# Patient Record
Sex: Female | Born: 1945
Health system: Southern US, Community
[De-identification: ages and names within clinical notes are randomized; demographics above are authoritative.]

## PROBLEM LIST (undated history)

## (undated) DIAGNOSIS — R112 Nausea with vomiting, unspecified: Secondary | ICD-10-CM

## (undated) DIAGNOSIS — E538 Deficiency of other specified B group vitamins: Secondary | ICD-10-CM

## (undated) DIAGNOSIS — J302 Other seasonal allergic rhinitis: Secondary | ICD-10-CM

## (undated) DIAGNOSIS — N814 Uterovaginal prolapse, unspecified: Secondary | ICD-10-CM

## (undated) DIAGNOSIS — I10 Essential (primary) hypertension: Secondary | ICD-10-CM

## (undated) DIAGNOSIS — E7800 Pure hypercholesterolemia, unspecified: Secondary | ICD-10-CM

## (undated) DIAGNOSIS — T8859XA Other complications of anesthesia, initial encounter: Secondary | ICD-10-CM

## (undated) DIAGNOSIS — E119 Type 2 diabetes mellitus without complications: Secondary | ICD-10-CM

## (undated) DIAGNOSIS — J189 Pneumonia, unspecified organism: Secondary | ICD-10-CM

## (undated) DIAGNOSIS — C4490 Unspecified malignant neoplasm of skin, unspecified: Secondary | ICD-10-CM

## (undated) DIAGNOSIS — Z9889 Other specified postprocedural states: Secondary | ICD-10-CM

## (undated) DIAGNOSIS — K5090 Crohn's disease, unspecified, without complications: Secondary | ICD-10-CM

## (undated) DIAGNOSIS — M1990 Unspecified osteoarthritis, unspecified site: Secondary | ICD-10-CM

## (undated) HISTORY — DX: Essential (primary) hypertension: I10

## (undated) HISTORY — PX: WISDOM TOOTH EXTRACTION: SHX21

## (undated) HISTORY — PX: SKIN CANCER EXCISION: SHX779

## (undated) HISTORY — DX: Unspecified malignant neoplasm of skin, unspecified: C44.90

## (undated) HISTORY — PX: DG ELBOW RIGHT COMPLETE (ARMC HX): HXRAD1501

## (undated) HISTORY — DX: Crohn's disease, unspecified, without complications: K50.90

## (undated) HISTORY — PX: OTHER SURGICAL HISTORY: SHX169

## (undated) HISTORY — DX: Type 2 diabetes mellitus without complications: E11.9

## (undated) HISTORY — DX: Unspecified osteoarthritis, unspecified site: M19.90

## (undated) HISTORY — PX: POLYPECTOMY: SHX149

## (undated) HISTORY — DX: Pure hypercholesterolemia, unspecified: E78.00

---

## 1975-10-18 HISTORY — PX: ABDOMINAL HYSTERECTOMY: SHX81

## 1976-10-17 DIAGNOSIS — R87629 Unspecified abnormal cytological findings in specimens from vagina: Secondary | ICD-10-CM

## 1976-10-17 HISTORY — DX: Unspecified abnormal cytological findings in specimens from vagina: R87.629

## 1983-10-18 HISTORY — PX: BREAST REDUCTION SURGERY: SHX8

## 1998-02-14 HISTORY — PX: OTHER SURGICAL HISTORY: SHX169

## 1998-05-22 ENCOUNTER — Ambulatory Visit (HOSPITAL_COMMUNITY): Admission: RE | Admit: 1998-05-22 | Discharge: 1998-05-22 | Payer: Self-pay | Admitting: Gastroenterology

## 1998-07-20 ENCOUNTER — Encounter: Payer: Self-pay | Admitting: Gastroenterology

## 2011-07-18 HISTORY — PX: OTHER SURGICAL HISTORY: SHX169

## 2013-10-21 DIAGNOSIS — E538 Deficiency of other specified B group vitamins: Secondary | ICD-10-CM | POA: Diagnosis not present

## 2013-10-21 DIAGNOSIS — E119 Type 2 diabetes mellitus without complications: Secondary | ICD-10-CM | POA: Diagnosis not present

## 2013-10-21 DIAGNOSIS — E785 Hyperlipidemia, unspecified: Secondary | ICD-10-CM | POA: Diagnosis not present

## 2013-10-28 DIAGNOSIS — E538 Deficiency of other specified B group vitamins: Secondary | ICD-10-CM | POA: Diagnosis not present

## 2013-10-28 DIAGNOSIS — E1149 Type 2 diabetes mellitus with other diabetic neurological complication: Secondary | ICD-10-CM | POA: Diagnosis not present

## 2013-10-28 DIAGNOSIS — Z0000 Encounter for general adult medical examination without abnormal findings: Secondary | ICD-10-CM | POA: Diagnosis not present

## 2013-10-28 DIAGNOSIS — E119 Type 2 diabetes mellitus without complications: Secondary | ICD-10-CM | POA: Diagnosis not present

## 2013-10-28 DIAGNOSIS — I10 Essential (primary) hypertension: Secondary | ICD-10-CM | POA: Diagnosis not present

## 2013-10-28 DIAGNOSIS — E785 Hyperlipidemia, unspecified: Secondary | ICD-10-CM | POA: Diagnosis not present

## 2013-10-29 DIAGNOSIS — L03039 Cellulitis of unspecified toe: Secondary | ICD-10-CM | POA: Diagnosis not present

## 2013-10-29 DIAGNOSIS — M79609 Pain in unspecified limb: Secondary | ICD-10-CM | POA: Diagnosis not present

## 2013-10-29 DIAGNOSIS — L600 Ingrowing nail: Secondary | ICD-10-CM | POA: Diagnosis not present

## 2013-11-21 DIAGNOSIS — Z8639 Personal history of other endocrine, nutritional and metabolic disease: Secondary | ICD-10-CM | POA: Diagnosis not present

## 2013-11-21 DIAGNOSIS — Z862 Personal history of diseases of the blood and blood-forming organs and certain disorders involving the immune mechanism: Secondary | ICD-10-CM | POA: Diagnosis not present

## 2013-11-21 DIAGNOSIS — E119 Type 2 diabetes mellitus without complications: Secondary | ICD-10-CM | POA: Diagnosis not present

## 2013-11-21 DIAGNOSIS — E1149 Type 2 diabetes mellitus with other diabetic neurological complication: Secondary | ICD-10-CM | POA: Diagnosis not present

## 2013-11-26 DIAGNOSIS — E1149 Type 2 diabetes mellitus with other diabetic neurological complication: Secondary | ICD-10-CM | POA: Diagnosis not present

## 2013-11-26 DIAGNOSIS — L608 Other nail disorders: Secondary | ICD-10-CM | POA: Diagnosis not present

## 2013-11-26 DIAGNOSIS — M79609 Pain in unspecified limb: Secondary | ICD-10-CM | POA: Diagnosis not present

## 2013-12-02 DIAGNOSIS — E538 Deficiency of other specified B group vitamins: Secondary | ICD-10-CM | POA: Diagnosis not present

## 2013-12-30 DIAGNOSIS — E538 Deficiency of other specified B group vitamins: Secondary | ICD-10-CM | POA: Diagnosis not present

## 2014-02-10 DIAGNOSIS — E538 Deficiency of other specified B group vitamins: Secondary | ICD-10-CM | POA: Diagnosis not present

## 2014-02-18 DIAGNOSIS — M79609 Pain in unspecified limb: Secondary | ICD-10-CM | POA: Diagnosis not present

## 2014-02-18 DIAGNOSIS — M19079 Primary osteoarthritis, unspecified ankle and foot: Secondary | ICD-10-CM | POA: Diagnosis not present

## 2014-02-18 DIAGNOSIS — E1149 Type 2 diabetes mellitus with other diabetic neurological complication: Secondary | ICD-10-CM | POA: Diagnosis not present

## 2014-02-18 DIAGNOSIS — L608 Other nail disorders: Secondary | ICD-10-CM | POA: Diagnosis not present

## 2014-02-28 DIAGNOSIS — M1990 Unspecified osteoarthritis, unspecified site: Secondary | ICD-10-CM | POA: Diagnosis not present

## 2014-02-28 DIAGNOSIS — E538 Deficiency of other specified B group vitamins: Secondary | ICD-10-CM | POA: Diagnosis not present

## 2014-02-28 DIAGNOSIS — M79609 Pain in unspecified limb: Secondary | ICD-10-CM | POA: Diagnosis not present

## 2014-08-21 DIAGNOSIS — E119 Type 2 diabetes mellitus without complications: Secondary | ICD-10-CM | POA: Diagnosis not present

## 2014-08-21 DIAGNOSIS — E538 Deficiency of other specified B group vitamins: Secondary | ICD-10-CM | POA: Diagnosis not present

## 2014-08-25 DIAGNOSIS — E1165 Type 2 diabetes mellitus with hyperglycemia: Secondary | ICD-10-CM | POA: Diagnosis not present

## 2014-08-25 DIAGNOSIS — E538 Deficiency of other specified B group vitamins: Secondary | ICD-10-CM | POA: Diagnosis not present

## 2014-08-25 DIAGNOSIS — E785 Hyperlipidemia, unspecified: Secondary | ICD-10-CM | POA: Diagnosis not present

## 2014-08-25 DIAGNOSIS — I10 Essential (primary) hypertension: Secondary | ICD-10-CM | POA: Diagnosis not present

## 2014-08-26 DIAGNOSIS — L603 Nail dystrophy: Secondary | ICD-10-CM | POA: Diagnosis not present

## 2014-08-26 DIAGNOSIS — M79671 Pain in right foot: Secondary | ICD-10-CM | POA: Diagnosis not present

## 2014-08-26 DIAGNOSIS — E1149 Type 2 diabetes mellitus with other diabetic neurological complication: Secondary | ICD-10-CM | POA: Diagnosis not present

## 2015-03-04 DIAGNOSIS — E538 Deficiency of other specified B group vitamins: Secondary | ICD-10-CM | POA: Diagnosis not present

## 2015-03-04 DIAGNOSIS — M1990 Unspecified osteoarthritis, unspecified site: Secondary | ICD-10-CM | POA: Diagnosis not present

## 2015-03-04 DIAGNOSIS — E785 Hyperlipidemia, unspecified: Secondary | ICD-10-CM | POA: Diagnosis not present

## 2015-03-04 DIAGNOSIS — I10 Essential (primary) hypertension: Secondary | ICD-10-CM | POA: Diagnosis not present

## 2015-03-04 DIAGNOSIS — E119 Type 2 diabetes mellitus without complications: Secondary | ICD-10-CM | POA: Diagnosis not present

## 2015-03-04 DIAGNOSIS — J309 Allergic rhinitis, unspecified: Secondary | ICD-10-CM | POA: Diagnosis not present

## 2015-03-04 DIAGNOSIS — R609 Edema, unspecified: Secondary | ICD-10-CM | POA: Diagnosis not present

## 2015-03-04 DIAGNOSIS — Z79899 Other long term (current) drug therapy: Secondary | ICD-10-CM | POA: Diagnosis not present

## 2015-03-18 DIAGNOSIS — Z79899 Other long term (current) drug therapy: Secondary | ICD-10-CM | POA: Diagnosis not present

## 2015-03-31 DIAGNOSIS — E538 Deficiency of other specified B group vitamins: Secondary | ICD-10-CM | POA: Diagnosis not present

## 2015-05-01 DIAGNOSIS — E538 Deficiency of other specified B group vitamins: Secondary | ICD-10-CM | POA: Diagnosis not present

## 2015-06-01 DIAGNOSIS — E538 Deficiency of other specified B group vitamins: Secondary | ICD-10-CM | POA: Diagnosis not present

## 2015-06-10 DIAGNOSIS — E538 Deficiency of other specified B group vitamins: Secondary | ICD-10-CM | POA: Diagnosis not present

## 2015-06-10 DIAGNOSIS — E785 Hyperlipidemia, unspecified: Secondary | ICD-10-CM | POA: Diagnosis not present

## 2015-06-10 DIAGNOSIS — I10 Essential (primary) hypertension: Secondary | ICD-10-CM | POA: Diagnosis not present

## 2015-06-10 DIAGNOSIS — Z23 Encounter for immunization: Secondary | ICD-10-CM | POA: Diagnosis not present

## 2015-06-10 DIAGNOSIS — E119 Type 2 diabetes mellitus without complications: Secondary | ICD-10-CM | POA: Diagnosis not present

## 2015-06-10 DIAGNOSIS — N951 Menopausal and female climacteric states: Secondary | ICD-10-CM | POA: Diagnosis not present

## 2015-06-10 DIAGNOSIS — Z0001 Encounter for general adult medical examination with abnormal findings: Secondary | ICD-10-CM | POA: Diagnosis not present

## 2015-06-10 DIAGNOSIS — Z79899 Other long term (current) drug therapy: Secondary | ICD-10-CM | POA: Diagnosis not present

## 2015-07-03 DIAGNOSIS — E538 Deficiency of other specified B group vitamins: Secondary | ICD-10-CM | POA: Diagnosis not present

## 2015-09-01 DIAGNOSIS — E538 Deficiency of other specified B group vitamins: Secondary | ICD-10-CM | POA: Diagnosis not present

## 2015-09-16 DIAGNOSIS — Z780 Asymptomatic menopausal state: Secondary | ICD-10-CM | POA: Diagnosis not present

## 2015-09-16 DIAGNOSIS — M8589 Other specified disorders of bone density and structure, multiple sites: Secondary | ICD-10-CM | POA: Diagnosis not present

## 2015-09-25 DIAGNOSIS — Z1211 Encounter for screening for malignant neoplasm of colon: Secondary | ICD-10-CM | POA: Diagnosis not present

## 2015-10-02 DIAGNOSIS — E538 Deficiency of other specified B group vitamins: Secondary | ICD-10-CM | POA: Diagnosis not present

## 2015-10-13 DIAGNOSIS — I10 Essential (primary) hypertension: Secondary | ICD-10-CM | POA: Diagnosis not present

## 2015-10-13 DIAGNOSIS — E1165 Type 2 diabetes mellitus with hyperglycemia: Secondary | ICD-10-CM | POA: Diagnosis not present

## 2015-10-13 DIAGNOSIS — J069 Acute upper respiratory infection, unspecified: Secondary | ICD-10-CM | POA: Diagnosis not present

## 2015-11-03 DIAGNOSIS — E538 Deficiency of other specified B group vitamins: Secondary | ICD-10-CM | POA: Diagnosis not present

## 2015-12-01 DIAGNOSIS — E538 Deficiency of other specified B group vitamins: Secondary | ICD-10-CM | POA: Diagnosis not present

## 2015-12-30 DIAGNOSIS — E538 Deficiency of other specified B group vitamins: Secondary | ICD-10-CM | POA: Diagnosis not present

## 2016-02-01 DIAGNOSIS — E7800 Pure hypercholesterolemia, unspecified: Secondary | ICD-10-CM | POA: Diagnosis not present

## 2016-02-01 DIAGNOSIS — J069 Acute upper respiratory infection, unspecified: Secondary | ICD-10-CM | POA: Diagnosis not present

## 2016-02-01 DIAGNOSIS — E1165 Type 2 diabetes mellitus with hyperglycemia: Secondary | ICD-10-CM | POA: Diagnosis not present

## 2016-02-01 DIAGNOSIS — E538 Deficiency of other specified B group vitamins: Secondary | ICD-10-CM | POA: Diagnosis not present

## 2016-02-01 DIAGNOSIS — I10 Essential (primary) hypertension: Secondary | ICD-10-CM | POA: Diagnosis not present

## 2016-02-01 DIAGNOSIS — Z7984 Long term (current) use of oral hypoglycemic drugs: Secondary | ICD-10-CM | POA: Diagnosis not present

## 2016-02-01 DIAGNOSIS — Z79899 Other long term (current) drug therapy: Secondary | ICD-10-CM | POA: Diagnosis not present

## 2016-03-02 DIAGNOSIS — E538 Deficiency of other specified B group vitamins: Secondary | ICD-10-CM | POA: Diagnosis not present

## 2016-03-31 DIAGNOSIS — E538 Deficiency of other specified B group vitamins: Secondary | ICD-10-CM | POA: Diagnosis not present

## 2016-05-04 DIAGNOSIS — Z79899 Other long term (current) drug therapy: Secondary | ICD-10-CM | POA: Diagnosis not present

## 2016-05-04 DIAGNOSIS — I10 Essential (primary) hypertension: Secondary | ICD-10-CM | POA: Diagnosis not present

## 2016-05-04 DIAGNOSIS — E7800 Pure hypercholesterolemia, unspecified: Secondary | ICD-10-CM | POA: Diagnosis not present

## 2016-05-04 DIAGNOSIS — E119 Type 2 diabetes mellitus without complications: Secondary | ICD-10-CM | POA: Diagnosis not present

## 2016-05-04 DIAGNOSIS — E1165 Type 2 diabetes mellitus with hyperglycemia: Secondary | ICD-10-CM | POA: Diagnosis not present

## 2016-05-04 DIAGNOSIS — Z7984 Long term (current) use of oral hypoglycemic drugs: Secondary | ICD-10-CM | POA: Diagnosis not present

## 2016-05-04 DIAGNOSIS — E538 Deficiency of other specified B group vitamins: Secondary | ICD-10-CM | POA: Diagnosis not present

## 2016-06-01 DIAGNOSIS — E538 Deficiency of other specified B group vitamins: Secondary | ICD-10-CM | POA: Diagnosis not present

## 2016-07-04 DIAGNOSIS — D538 Other specified nutritional anemias: Secondary | ICD-10-CM | POA: Diagnosis not present

## 2016-08-01 DIAGNOSIS — E538 Deficiency of other specified B group vitamins: Secondary | ICD-10-CM | POA: Diagnosis not present

## 2016-09-05 DIAGNOSIS — Z23 Encounter for immunization: Secondary | ICD-10-CM | POA: Diagnosis not present

## 2016-09-05 DIAGNOSIS — G8929 Other chronic pain: Secondary | ICD-10-CM | POA: Diagnosis not present

## 2016-09-05 DIAGNOSIS — E538 Deficiency of other specified B group vitamins: Secondary | ICD-10-CM | POA: Diagnosis not present

## 2016-09-05 DIAGNOSIS — Z0001 Encounter for general adult medical examination with abnormal findings: Secondary | ICD-10-CM | POA: Diagnosis not present

## 2016-09-05 DIAGNOSIS — E7800 Pure hypercholesterolemia, unspecified: Secondary | ICD-10-CM | POA: Diagnosis not present

## 2016-09-05 DIAGNOSIS — E119 Type 2 diabetes mellitus without complications: Secondary | ICD-10-CM | POA: Diagnosis not present

## 2016-09-05 DIAGNOSIS — M25572 Pain in left ankle and joints of left foot: Secondary | ICD-10-CM | POA: Diagnosis not present

## 2016-09-05 DIAGNOSIS — Z79899 Other long term (current) drug therapy: Secondary | ICD-10-CM | POA: Diagnosis not present

## 2016-09-05 DIAGNOSIS — E1165 Type 2 diabetes mellitus with hyperglycemia: Secondary | ICD-10-CM | POA: Diagnosis not present

## 2016-09-05 DIAGNOSIS — Z7984 Long term (current) use of oral hypoglycemic drugs: Secondary | ICD-10-CM | POA: Diagnosis not present

## 2016-09-05 DIAGNOSIS — I10 Essential (primary) hypertension: Secondary | ICD-10-CM | POA: Diagnosis not present

## 2016-09-05 DIAGNOSIS — M25571 Pain in right ankle and joints of right foot: Secondary | ICD-10-CM | POA: Diagnosis not present

## 2016-09-29 DIAGNOSIS — Z1211 Encounter for screening for malignant neoplasm of colon: Secondary | ICD-10-CM | POA: Diagnosis not present

## 2016-09-29 DIAGNOSIS — E538 Deficiency of other specified B group vitamins: Secondary | ICD-10-CM | POA: Diagnosis not present

## 2016-11-01 DIAGNOSIS — E538 Deficiency of other specified B group vitamins: Secondary | ICD-10-CM | POA: Diagnosis not present

## 2016-11-30 DIAGNOSIS — E538 Deficiency of other specified B group vitamins: Secondary | ICD-10-CM | POA: Diagnosis not present

## 2016-12-28 DIAGNOSIS — E538 Deficiency of other specified B group vitamins: Secondary | ICD-10-CM | POA: Diagnosis not present

## 2017-01-31 DIAGNOSIS — E538 Deficiency of other specified B group vitamins: Secondary | ICD-10-CM | POA: Diagnosis not present

## 2017-03-06 DIAGNOSIS — E7800 Pure hypercholesterolemia, unspecified: Secondary | ICD-10-CM | POA: Diagnosis not present

## 2017-03-06 DIAGNOSIS — E538 Deficiency of other specified B group vitamins: Secondary | ICD-10-CM | POA: Diagnosis not present

## 2017-03-06 DIAGNOSIS — Z79899 Other long term (current) drug therapy: Secondary | ICD-10-CM | POA: Diagnosis not present

## 2017-03-06 DIAGNOSIS — E119 Type 2 diabetes mellitus without complications: Secondary | ICD-10-CM | POA: Diagnosis not present

## 2017-03-06 DIAGNOSIS — M1990 Unspecified osteoarthritis, unspecified site: Secondary | ICD-10-CM | POA: Diagnosis not present

## 2017-03-06 DIAGNOSIS — I10 Essential (primary) hypertension: Secondary | ICD-10-CM | POA: Diagnosis not present

## 2017-03-06 DIAGNOSIS — J0100 Acute maxillary sinusitis, unspecified: Secondary | ICD-10-CM | POA: Diagnosis not present

## 2017-03-31 DIAGNOSIS — E538 Deficiency of other specified B group vitamins: Secondary | ICD-10-CM | POA: Diagnosis not present

## 2017-04-25 DIAGNOSIS — E119 Type 2 diabetes mellitus without complications: Secondary | ICD-10-CM | POA: Diagnosis not present

## 2017-05-01 DIAGNOSIS — E538 Deficiency of other specified B group vitamins: Secondary | ICD-10-CM | POA: Diagnosis not present

## 2017-05-31 DIAGNOSIS — E538 Deficiency of other specified B group vitamins: Secondary | ICD-10-CM | POA: Diagnosis not present

## 2017-05-31 DIAGNOSIS — E119 Type 2 diabetes mellitus without complications: Secondary | ICD-10-CM | POA: Diagnosis not present

## 2017-07-03 DIAGNOSIS — E538 Deficiency of other specified B group vitamins: Secondary | ICD-10-CM | POA: Diagnosis not present

## 2017-08-02 DIAGNOSIS — E538 Deficiency of other specified B group vitamins: Secondary | ICD-10-CM | POA: Diagnosis not present

## 2017-08-30 DIAGNOSIS — E538 Deficiency of other specified B group vitamins: Secondary | ICD-10-CM | POA: Diagnosis not present

## 2017-09-18 DIAGNOSIS — R040 Epistaxis: Secondary | ICD-10-CM | POA: Diagnosis not present

## 2017-09-27 DIAGNOSIS — I10 Essential (primary) hypertension: Secondary | ICD-10-CM | POA: Diagnosis not present

## 2017-09-27 DIAGNOSIS — Z0001 Encounter for general adult medical examination with abnormal findings: Secondary | ICD-10-CM | POA: Diagnosis not present

## 2017-09-27 DIAGNOSIS — E538 Deficiency of other specified B group vitamins: Secondary | ICD-10-CM | POA: Diagnosis not present

## 2017-09-27 DIAGNOSIS — Z79899 Other long term (current) drug therapy: Secondary | ICD-10-CM | POA: Diagnosis not present

## 2017-09-27 DIAGNOSIS — E119 Type 2 diabetes mellitus without complications: Secondary | ICD-10-CM | POA: Diagnosis not present

## 2017-09-27 DIAGNOSIS — M1990 Unspecified osteoarthritis, unspecified site: Secondary | ICD-10-CM | POA: Diagnosis not present

## 2017-09-27 DIAGNOSIS — E7800 Pure hypercholesterolemia, unspecified: Secondary | ICD-10-CM | POA: Diagnosis not present

## 2017-10-03 DIAGNOSIS — Z0001 Encounter for general adult medical examination with abnormal findings: Secondary | ICD-10-CM | POA: Diagnosis not present

## 2017-10-16 DIAGNOSIS — R040 Epistaxis: Secondary | ICD-10-CM | POA: Diagnosis not present

## 2017-10-31 DIAGNOSIS — E538 Deficiency of other specified B group vitamins: Secondary | ICD-10-CM | POA: Diagnosis not present

## 2017-11-30 DIAGNOSIS — E538 Deficiency of other specified B group vitamins: Secondary | ICD-10-CM | POA: Diagnosis not present

## 2017-12-28 DIAGNOSIS — E538 Deficiency of other specified B group vitamins: Secondary | ICD-10-CM | POA: Diagnosis not present

## 2018-01-30 DIAGNOSIS — E538 Deficiency of other specified B group vitamins: Secondary | ICD-10-CM | POA: Diagnosis not present

## 2018-02-12 DIAGNOSIS — L600 Ingrowing nail: Secondary | ICD-10-CM | POA: Diagnosis not present

## 2018-02-12 DIAGNOSIS — S90211A Contusion of right great toe with damage to nail, initial encounter: Secondary | ICD-10-CM | POA: Diagnosis not present

## 2018-03-01 DIAGNOSIS — E538 Deficiency of other specified B group vitamins: Secondary | ICD-10-CM | POA: Diagnosis not present

## 2018-03-02 DIAGNOSIS — Z1231 Encounter for screening mammogram for malignant neoplasm of breast: Secondary | ICD-10-CM | POA: Diagnosis not present

## 2018-03-14 DIAGNOSIS — R928 Other abnormal and inconclusive findings on diagnostic imaging of breast: Secondary | ICD-10-CM | POA: Diagnosis not present

## 2018-03-14 DIAGNOSIS — Z1231 Encounter for screening mammogram for malignant neoplasm of breast: Secondary | ICD-10-CM | POA: Diagnosis not present

## 2018-03-14 DIAGNOSIS — N6489 Other specified disorders of breast: Secondary | ICD-10-CM | POA: Diagnosis not present

## 2018-04-02 DIAGNOSIS — E1165 Type 2 diabetes mellitus with hyperglycemia: Secondary | ICD-10-CM | POA: Diagnosis not present

## 2018-04-02 DIAGNOSIS — I10 Essential (primary) hypertension: Secondary | ICD-10-CM | POA: Diagnosis not present

## 2018-04-02 DIAGNOSIS — Z79899 Other long term (current) drug therapy: Secondary | ICD-10-CM | POA: Diagnosis not present

## 2018-04-02 DIAGNOSIS — E538 Deficiency of other specified B group vitamins: Secondary | ICD-10-CM | POA: Diagnosis not present

## 2018-04-02 DIAGNOSIS — E7800 Pure hypercholesterolemia, unspecified: Secondary | ICD-10-CM | POA: Diagnosis not present

## 2018-04-02 DIAGNOSIS — M1990 Unspecified osteoarthritis, unspecified site: Secondary | ICD-10-CM | POA: Diagnosis not present

## 2018-05-01 DIAGNOSIS — E538 Deficiency of other specified B group vitamins: Secondary | ICD-10-CM | POA: Diagnosis not present

## 2018-05-30 DIAGNOSIS — E538 Deficiency of other specified B group vitamins: Secondary | ICD-10-CM | POA: Diagnosis not present

## 2018-07-02 DIAGNOSIS — E538 Deficiency of other specified B group vitamins: Secondary | ICD-10-CM | POA: Diagnosis not present

## 2018-08-03 DIAGNOSIS — E538 Deficiency of other specified B group vitamins: Secondary | ICD-10-CM | POA: Diagnosis not present

## 2018-08-06 DIAGNOSIS — M25512 Pain in left shoulder: Secondary | ICD-10-CM | POA: Diagnosis not present

## 2018-08-06 DIAGNOSIS — M79642 Pain in left hand: Secondary | ICD-10-CM | POA: Diagnosis not present

## 2018-08-06 DIAGNOSIS — I10 Essential (primary) hypertension: Secondary | ICD-10-CM | POA: Diagnosis not present

## 2018-08-06 DIAGNOSIS — Z79899 Other long term (current) drug therapy: Secondary | ICD-10-CM | POA: Diagnosis not present

## 2018-08-09 DIAGNOSIS — M5412 Radiculopathy, cervical region: Secondary | ICD-10-CM | POA: Diagnosis not present

## 2018-08-09 DIAGNOSIS — M25512 Pain in left shoulder: Secondary | ICD-10-CM | POA: Diagnosis not present

## 2018-08-09 DIAGNOSIS — M25612 Stiffness of left shoulder, not elsewhere classified: Secondary | ICD-10-CM | POA: Diagnosis not present

## 2018-08-09 DIAGNOSIS — M62512 Muscle wasting and atrophy, not elsewhere classified, left shoulder: Secondary | ICD-10-CM | POA: Diagnosis not present

## 2018-08-14 DIAGNOSIS — M25612 Stiffness of left shoulder, not elsewhere classified: Secondary | ICD-10-CM | POA: Diagnosis not present

## 2018-08-14 DIAGNOSIS — M5412 Radiculopathy, cervical region: Secondary | ICD-10-CM | POA: Diagnosis not present

## 2018-08-14 DIAGNOSIS — M25512 Pain in left shoulder: Secondary | ICD-10-CM | POA: Diagnosis not present

## 2018-08-14 DIAGNOSIS — M62512 Muscle wasting and atrophy, not elsewhere classified, left shoulder: Secondary | ICD-10-CM | POA: Diagnosis not present

## 2018-08-16 DIAGNOSIS — M25612 Stiffness of left shoulder, not elsewhere classified: Secondary | ICD-10-CM | POA: Diagnosis not present

## 2018-08-16 DIAGNOSIS — M62512 Muscle wasting and atrophy, not elsewhere classified, left shoulder: Secondary | ICD-10-CM | POA: Diagnosis not present

## 2018-08-16 DIAGNOSIS — M25512 Pain in left shoulder: Secondary | ICD-10-CM | POA: Diagnosis not present

## 2018-08-16 DIAGNOSIS — M5412 Radiculopathy, cervical region: Secondary | ICD-10-CM | POA: Diagnosis not present

## 2018-08-17 DIAGNOSIS — M62512 Muscle wasting and atrophy, not elsewhere classified, left shoulder: Secondary | ICD-10-CM | POA: Diagnosis not present

## 2018-08-17 DIAGNOSIS — M25512 Pain in left shoulder: Secondary | ICD-10-CM | POA: Diagnosis not present

## 2018-08-17 DIAGNOSIS — M5412 Radiculopathy, cervical region: Secondary | ICD-10-CM | POA: Diagnosis not present

## 2018-08-17 DIAGNOSIS — M25612 Stiffness of left shoulder, not elsewhere classified: Secondary | ICD-10-CM | POA: Diagnosis not present

## 2018-08-20 DIAGNOSIS — Z79899 Other long term (current) drug therapy: Secondary | ICD-10-CM | POA: Diagnosis not present

## 2018-08-21 DIAGNOSIS — M25512 Pain in left shoulder: Secondary | ICD-10-CM | POA: Diagnosis not present

## 2018-08-21 DIAGNOSIS — M25612 Stiffness of left shoulder, not elsewhere classified: Secondary | ICD-10-CM | POA: Diagnosis not present

## 2018-08-21 DIAGNOSIS — M62512 Muscle wasting and atrophy, not elsewhere classified, left shoulder: Secondary | ICD-10-CM | POA: Diagnosis not present

## 2018-08-21 DIAGNOSIS — M5412 Radiculopathy, cervical region: Secondary | ICD-10-CM | POA: Diagnosis not present

## 2018-08-22 DIAGNOSIS — M25512 Pain in left shoulder: Secondary | ICD-10-CM | POA: Diagnosis not present

## 2018-08-22 DIAGNOSIS — M62512 Muscle wasting and atrophy, not elsewhere classified, left shoulder: Secondary | ICD-10-CM | POA: Diagnosis not present

## 2018-08-22 DIAGNOSIS — M25612 Stiffness of left shoulder, not elsewhere classified: Secondary | ICD-10-CM | POA: Diagnosis not present

## 2018-08-22 DIAGNOSIS — M5412 Radiculopathy, cervical region: Secondary | ICD-10-CM | POA: Diagnosis not present

## 2018-08-23 DIAGNOSIS — M62512 Muscle wasting and atrophy, not elsewhere classified, left shoulder: Secondary | ICD-10-CM | POA: Diagnosis not present

## 2018-08-23 DIAGNOSIS — M25612 Stiffness of left shoulder, not elsewhere classified: Secondary | ICD-10-CM | POA: Diagnosis not present

## 2018-08-23 DIAGNOSIS — M5412 Radiculopathy, cervical region: Secondary | ICD-10-CM | POA: Diagnosis not present

## 2018-08-23 DIAGNOSIS — M25512 Pain in left shoulder: Secondary | ICD-10-CM | POA: Diagnosis not present

## 2018-08-27 DIAGNOSIS — M25512 Pain in left shoulder: Secondary | ICD-10-CM | POA: Diagnosis not present

## 2018-08-27 DIAGNOSIS — M5412 Radiculopathy, cervical region: Secondary | ICD-10-CM | POA: Diagnosis not present

## 2018-08-27 DIAGNOSIS — M25612 Stiffness of left shoulder, not elsewhere classified: Secondary | ICD-10-CM | POA: Diagnosis not present

## 2018-08-27 DIAGNOSIS — M62512 Muscle wasting and atrophy, not elsewhere classified, left shoulder: Secondary | ICD-10-CM | POA: Diagnosis not present

## 2018-08-29 DIAGNOSIS — M5412 Radiculopathy, cervical region: Secondary | ICD-10-CM | POA: Diagnosis not present

## 2018-08-29 DIAGNOSIS — M62512 Muscle wasting and atrophy, not elsewhere classified, left shoulder: Secondary | ICD-10-CM | POA: Diagnosis not present

## 2018-08-29 DIAGNOSIS — M25512 Pain in left shoulder: Secondary | ICD-10-CM | POA: Diagnosis not present

## 2018-08-29 DIAGNOSIS — M25612 Stiffness of left shoulder, not elsewhere classified: Secondary | ICD-10-CM | POA: Diagnosis not present

## 2018-08-30 DIAGNOSIS — E538 Deficiency of other specified B group vitamins: Secondary | ICD-10-CM | POA: Diagnosis not present

## 2018-08-31 DIAGNOSIS — M62512 Muscle wasting and atrophy, not elsewhere classified, left shoulder: Secondary | ICD-10-CM | POA: Diagnosis not present

## 2018-08-31 DIAGNOSIS — M25612 Stiffness of left shoulder, not elsewhere classified: Secondary | ICD-10-CM | POA: Diagnosis not present

## 2018-08-31 DIAGNOSIS — M5412 Radiculopathy, cervical region: Secondary | ICD-10-CM | POA: Diagnosis not present

## 2018-08-31 DIAGNOSIS — M25512 Pain in left shoulder: Secondary | ICD-10-CM | POA: Diagnosis not present

## 2018-09-04 DIAGNOSIS — M5412 Radiculopathy, cervical region: Secondary | ICD-10-CM | POA: Diagnosis not present

## 2018-09-04 DIAGNOSIS — M62512 Muscle wasting and atrophy, not elsewhere classified, left shoulder: Secondary | ICD-10-CM | POA: Diagnosis not present

## 2018-09-04 DIAGNOSIS — M25612 Stiffness of left shoulder, not elsewhere classified: Secondary | ICD-10-CM | POA: Diagnosis not present

## 2018-09-04 DIAGNOSIS — M25512 Pain in left shoulder: Secondary | ICD-10-CM | POA: Diagnosis not present

## 2018-09-05 DIAGNOSIS — M62512 Muscle wasting and atrophy, not elsewhere classified, left shoulder: Secondary | ICD-10-CM | POA: Diagnosis not present

## 2018-09-05 DIAGNOSIS — M25612 Stiffness of left shoulder, not elsewhere classified: Secondary | ICD-10-CM | POA: Diagnosis not present

## 2018-09-05 DIAGNOSIS — M25512 Pain in left shoulder: Secondary | ICD-10-CM | POA: Diagnosis not present

## 2018-09-05 DIAGNOSIS — M5412 Radiculopathy, cervical region: Secondary | ICD-10-CM | POA: Diagnosis not present

## 2018-09-07 DIAGNOSIS — M25512 Pain in left shoulder: Secondary | ICD-10-CM | POA: Diagnosis not present

## 2018-09-07 DIAGNOSIS — M62512 Muscle wasting and atrophy, not elsewhere classified, left shoulder: Secondary | ICD-10-CM | POA: Diagnosis not present

## 2018-09-07 DIAGNOSIS — M25612 Stiffness of left shoulder, not elsewhere classified: Secondary | ICD-10-CM | POA: Diagnosis not present

## 2018-09-07 DIAGNOSIS — M5412 Radiculopathy, cervical region: Secondary | ICD-10-CM | POA: Diagnosis not present

## 2018-09-11 DIAGNOSIS — M62512 Muscle wasting and atrophy, not elsewhere classified, left shoulder: Secondary | ICD-10-CM | POA: Diagnosis not present

## 2018-09-11 DIAGNOSIS — M25512 Pain in left shoulder: Secondary | ICD-10-CM | POA: Diagnosis not present

## 2018-09-11 DIAGNOSIS — M5412 Radiculopathy, cervical region: Secondary | ICD-10-CM | POA: Diagnosis not present

## 2018-09-11 DIAGNOSIS — M25612 Stiffness of left shoulder, not elsewhere classified: Secondary | ICD-10-CM | POA: Diagnosis not present

## 2018-09-17 DIAGNOSIS — M5412 Radiculopathy, cervical region: Secondary | ICD-10-CM | POA: Diagnosis not present

## 2018-09-17 DIAGNOSIS — M25612 Stiffness of left shoulder, not elsewhere classified: Secondary | ICD-10-CM | POA: Diagnosis not present

## 2018-09-17 DIAGNOSIS — M62512 Muscle wasting and atrophy, not elsewhere classified, left shoulder: Secondary | ICD-10-CM | POA: Diagnosis not present

## 2018-09-17 DIAGNOSIS — M25512 Pain in left shoulder: Secondary | ICD-10-CM | POA: Diagnosis not present

## 2018-09-19 DIAGNOSIS — M25512 Pain in left shoulder: Secondary | ICD-10-CM | POA: Diagnosis not present

## 2018-09-19 DIAGNOSIS — M25612 Stiffness of left shoulder, not elsewhere classified: Secondary | ICD-10-CM | POA: Diagnosis not present

## 2018-09-19 DIAGNOSIS — M5412 Radiculopathy, cervical region: Secondary | ICD-10-CM | POA: Diagnosis not present

## 2018-09-19 DIAGNOSIS — M62512 Muscle wasting and atrophy, not elsewhere classified, left shoulder: Secondary | ICD-10-CM | POA: Diagnosis not present

## 2018-09-25 DIAGNOSIS — M25512 Pain in left shoulder: Secondary | ICD-10-CM | POA: Diagnosis not present

## 2018-09-25 DIAGNOSIS — M62512 Muscle wasting and atrophy, not elsewhere classified, left shoulder: Secondary | ICD-10-CM | POA: Diagnosis not present

## 2018-09-25 DIAGNOSIS — M5412 Radiculopathy, cervical region: Secondary | ICD-10-CM | POA: Diagnosis not present

## 2018-09-25 DIAGNOSIS — M25612 Stiffness of left shoulder, not elsewhere classified: Secondary | ICD-10-CM | POA: Diagnosis not present

## 2018-09-27 DIAGNOSIS — M5412 Radiculopathy, cervical region: Secondary | ICD-10-CM | POA: Diagnosis not present

## 2018-09-27 DIAGNOSIS — M62512 Muscle wasting and atrophy, not elsewhere classified, left shoulder: Secondary | ICD-10-CM | POA: Diagnosis not present

## 2018-09-27 DIAGNOSIS — M25612 Stiffness of left shoulder, not elsewhere classified: Secondary | ICD-10-CM | POA: Diagnosis not present

## 2018-09-27 DIAGNOSIS — M25512 Pain in left shoulder: Secondary | ICD-10-CM | POA: Diagnosis not present

## 2018-10-02 DIAGNOSIS — E538 Deficiency of other specified B group vitamins: Secondary | ICD-10-CM | POA: Diagnosis not present

## 2018-10-03 DIAGNOSIS — M62512 Muscle wasting and atrophy, not elsewhere classified, left shoulder: Secondary | ICD-10-CM | POA: Diagnosis not present

## 2018-10-03 DIAGNOSIS — M25512 Pain in left shoulder: Secondary | ICD-10-CM | POA: Diagnosis not present

## 2018-10-03 DIAGNOSIS — M5412 Radiculopathy, cervical region: Secondary | ICD-10-CM | POA: Diagnosis not present

## 2018-10-03 DIAGNOSIS — M25612 Stiffness of left shoulder, not elsewhere classified: Secondary | ICD-10-CM | POA: Diagnosis not present

## 2018-10-05 DIAGNOSIS — M25512 Pain in left shoulder: Secondary | ICD-10-CM | POA: Diagnosis not present

## 2018-10-05 DIAGNOSIS — M25612 Stiffness of left shoulder, not elsewhere classified: Secondary | ICD-10-CM | POA: Diagnosis not present

## 2018-10-05 DIAGNOSIS — M5412 Radiculopathy, cervical region: Secondary | ICD-10-CM | POA: Diagnosis not present

## 2018-10-05 DIAGNOSIS — M62512 Muscle wasting and atrophy, not elsewhere classified, left shoulder: Secondary | ICD-10-CM | POA: Diagnosis not present

## 2018-10-07 DIAGNOSIS — M5412 Radiculopathy, cervical region: Secondary | ICD-10-CM | POA: Diagnosis not present

## 2018-10-07 DIAGNOSIS — M25512 Pain in left shoulder: Secondary | ICD-10-CM | POA: Diagnosis not present

## 2018-10-07 DIAGNOSIS — M25612 Stiffness of left shoulder, not elsewhere classified: Secondary | ICD-10-CM | POA: Diagnosis not present

## 2018-10-07 DIAGNOSIS — M62512 Muscle wasting and atrophy, not elsewhere classified, left shoulder: Secondary | ICD-10-CM | POA: Diagnosis not present

## 2018-10-15 DIAGNOSIS — M62512 Muscle wasting and atrophy, not elsewhere classified, left shoulder: Secondary | ICD-10-CM | POA: Diagnosis not present

## 2018-10-15 DIAGNOSIS — M25612 Stiffness of left shoulder, not elsewhere classified: Secondary | ICD-10-CM | POA: Diagnosis not present

## 2018-10-15 DIAGNOSIS — M5412 Radiculopathy, cervical region: Secondary | ICD-10-CM | POA: Diagnosis not present

## 2018-10-15 DIAGNOSIS — M25512 Pain in left shoulder: Secondary | ICD-10-CM | POA: Diagnosis not present

## 2018-10-18 DIAGNOSIS — M25512 Pain in left shoulder: Secondary | ICD-10-CM | POA: Diagnosis not present

## 2018-10-18 DIAGNOSIS — M62512 Muscle wasting and atrophy, not elsewhere classified, left shoulder: Secondary | ICD-10-CM | POA: Diagnosis not present

## 2018-10-18 DIAGNOSIS — M25612 Stiffness of left shoulder, not elsewhere classified: Secondary | ICD-10-CM | POA: Diagnosis not present

## 2018-10-18 DIAGNOSIS — M5412 Radiculopathy, cervical region: Secondary | ICD-10-CM | POA: Diagnosis not present

## 2018-10-23 DIAGNOSIS — M25612 Stiffness of left shoulder, not elsewhere classified: Secondary | ICD-10-CM | POA: Diagnosis not present

## 2018-10-23 DIAGNOSIS — M25512 Pain in left shoulder: Secondary | ICD-10-CM | POA: Diagnosis not present

## 2018-10-23 DIAGNOSIS — M5412 Radiculopathy, cervical region: Secondary | ICD-10-CM | POA: Diagnosis not present

## 2018-10-23 DIAGNOSIS — M62512 Muscle wasting and atrophy, not elsewhere classified, left shoulder: Secondary | ICD-10-CM | POA: Diagnosis not present

## 2018-10-26 DIAGNOSIS — M5412 Radiculopathy, cervical region: Secondary | ICD-10-CM | POA: Diagnosis not present

## 2018-10-26 DIAGNOSIS — M25612 Stiffness of left shoulder, not elsewhere classified: Secondary | ICD-10-CM | POA: Diagnosis not present

## 2018-10-26 DIAGNOSIS — M25512 Pain in left shoulder: Secondary | ICD-10-CM | POA: Diagnosis not present

## 2018-10-26 DIAGNOSIS — M62512 Muscle wasting and atrophy, not elsewhere classified, left shoulder: Secondary | ICD-10-CM | POA: Diagnosis not present

## 2018-10-30 DIAGNOSIS — M25612 Stiffness of left shoulder, not elsewhere classified: Secondary | ICD-10-CM | POA: Diagnosis not present

## 2018-10-30 DIAGNOSIS — M5412 Radiculopathy, cervical region: Secondary | ICD-10-CM | POA: Diagnosis not present

## 2018-10-30 DIAGNOSIS — M25512 Pain in left shoulder: Secondary | ICD-10-CM | POA: Diagnosis not present

## 2018-10-30 DIAGNOSIS — M62512 Muscle wasting and atrophy, not elsewhere classified, left shoulder: Secondary | ICD-10-CM | POA: Diagnosis not present

## 2018-11-01 DIAGNOSIS — M25612 Stiffness of left shoulder, not elsewhere classified: Secondary | ICD-10-CM | POA: Diagnosis not present

## 2018-11-01 DIAGNOSIS — M62512 Muscle wasting and atrophy, not elsewhere classified, left shoulder: Secondary | ICD-10-CM | POA: Diagnosis not present

## 2018-11-01 DIAGNOSIS — M25512 Pain in left shoulder: Secondary | ICD-10-CM | POA: Diagnosis not present

## 2018-11-01 DIAGNOSIS — M5412 Radiculopathy, cervical region: Secondary | ICD-10-CM | POA: Diagnosis not present

## 2018-11-06 DIAGNOSIS — M25612 Stiffness of left shoulder, not elsewhere classified: Secondary | ICD-10-CM | POA: Diagnosis not present

## 2018-11-06 DIAGNOSIS — M25512 Pain in left shoulder: Secondary | ICD-10-CM | POA: Diagnosis not present

## 2018-11-06 DIAGNOSIS — M5412 Radiculopathy, cervical region: Secondary | ICD-10-CM | POA: Diagnosis not present

## 2018-11-06 DIAGNOSIS — M62512 Muscle wasting and atrophy, not elsewhere classified, left shoulder: Secondary | ICD-10-CM | POA: Diagnosis not present

## 2018-11-07 DIAGNOSIS — E7800 Pure hypercholesterolemia, unspecified: Secondary | ICD-10-CM | POA: Diagnosis not present

## 2018-11-07 DIAGNOSIS — Z0001 Encounter for general adult medical examination with abnormal findings: Secondary | ICD-10-CM | POA: Diagnosis not present

## 2018-11-07 DIAGNOSIS — I10 Essential (primary) hypertension: Secondary | ICD-10-CM | POA: Diagnosis not present

## 2018-11-07 DIAGNOSIS — E538 Deficiency of other specified B group vitamins: Secondary | ICD-10-CM | POA: Diagnosis not present

## 2018-11-07 DIAGNOSIS — E2839 Other primary ovarian failure: Secondary | ICD-10-CM | POA: Diagnosis not present

## 2018-11-07 DIAGNOSIS — M1990 Unspecified osteoarthritis, unspecified site: Secondary | ICD-10-CM | POA: Diagnosis not present

## 2018-11-07 DIAGNOSIS — Z79899 Other long term (current) drug therapy: Secondary | ICD-10-CM | POA: Diagnosis not present

## 2018-11-07 DIAGNOSIS — R200 Anesthesia of skin: Secondary | ICD-10-CM | POA: Diagnosis not present

## 2018-11-07 DIAGNOSIS — E1165 Type 2 diabetes mellitus with hyperglycemia: Secondary | ICD-10-CM | POA: Diagnosis not present

## 2018-11-08 DIAGNOSIS — M25512 Pain in left shoulder: Secondary | ICD-10-CM | POA: Diagnosis not present

## 2018-11-08 DIAGNOSIS — M5412 Radiculopathy, cervical region: Secondary | ICD-10-CM | POA: Diagnosis not present

## 2018-11-08 DIAGNOSIS — M25612 Stiffness of left shoulder, not elsewhere classified: Secondary | ICD-10-CM | POA: Diagnosis not present

## 2018-11-08 DIAGNOSIS — M62512 Muscle wasting and atrophy, not elsewhere classified, left shoulder: Secondary | ICD-10-CM | POA: Diagnosis not present

## 2018-11-12 DIAGNOSIS — E2839 Other primary ovarian failure: Secondary | ICD-10-CM | POA: Diagnosis not present

## 2018-11-12 DIAGNOSIS — Z13820 Encounter for screening for osteoporosis: Secondary | ICD-10-CM | POA: Diagnosis not present

## 2018-11-12 DIAGNOSIS — Z780 Asymptomatic menopausal state: Secondary | ICD-10-CM | POA: Diagnosis not present

## 2018-11-13 DIAGNOSIS — M25512 Pain in left shoulder: Secondary | ICD-10-CM | POA: Diagnosis not present

## 2018-11-13 DIAGNOSIS — M62512 Muscle wasting and atrophy, not elsewhere classified, left shoulder: Secondary | ICD-10-CM | POA: Diagnosis not present

## 2018-11-13 DIAGNOSIS — M5412 Radiculopathy, cervical region: Secondary | ICD-10-CM | POA: Diagnosis not present

## 2018-11-13 DIAGNOSIS — M25612 Stiffness of left shoulder, not elsewhere classified: Secondary | ICD-10-CM | POA: Diagnosis not present

## 2018-11-15 DIAGNOSIS — M5412 Radiculopathy, cervical region: Secondary | ICD-10-CM | POA: Diagnosis not present

## 2018-11-15 DIAGNOSIS — M62512 Muscle wasting and atrophy, not elsewhere classified, left shoulder: Secondary | ICD-10-CM | POA: Diagnosis not present

## 2018-11-15 DIAGNOSIS — M25612 Stiffness of left shoulder, not elsewhere classified: Secondary | ICD-10-CM | POA: Diagnosis not present

## 2018-11-15 DIAGNOSIS — M25512 Pain in left shoulder: Secondary | ICD-10-CM | POA: Diagnosis not present

## 2018-11-20 DIAGNOSIS — M25512 Pain in left shoulder: Secondary | ICD-10-CM | POA: Diagnosis not present

## 2018-11-20 DIAGNOSIS — M25612 Stiffness of left shoulder, not elsewhere classified: Secondary | ICD-10-CM | POA: Diagnosis not present

## 2018-11-20 DIAGNOSIS — M62512 Muscle wasting and atrophy, not elsewhere classified, left shoulder: Secondary | ICD-10-CM | POA: Diagnosis not present

## 2018-11-20 DIAGNOSIS — M5412 Radiculopathy, cervical region: Secondary | ICD-10-CM | POA: Diagnosis not present

## 2018-11-21 DIAGNOSIS — E119 Type 2 diabetes mellitus without complications: Secondary | ICD-10-CM | POA: Diagnosis not present

## 2018-11-21 DIAGNOSIS — S91209A Unspecified open wound of unspecified toe(s) with damage to nail, initial encounter: Secondary | ICD-10-CM | POA: Diagnosis not present

## 2018-11-21 DIAGNOSIS — S90211A Contusion of right great toe with damage to nail, initial encounter: Secondary | ICD-10-CM | POA: Diagnosis not present

## 2018-11-26 DIAGNOSIS — M25612 Stiffness of left shoulder, not elsewhere classified: Secondary | ICD-10-CM | POA: Diagnosis not present

## 2018-11-26 DIAGNOSIS — M25512 Pain in left shoulder: Secondary | ICD-10-CM | POA: Diagnosis not present

## 2018-11-26 DIAGNOSIS — M62512 Muscle wasting and atrophy, not elsewhere classified, left shoulder: Secondary | ICD-10-CM | POA: Diagnosis not present

## 2018-11-26 DIAGNOSIS — M5412 Radiculopathy, cervical region: Secondary | ICD-10-CM | POA: Diagnosis not present

## 2018-11-28 DIAGNOSIS — M25512 Pain in left shoulder: Secondary | ICD-10-CM | POA: Diagnosis not present

## 2018-11-28 DIAGNOSIS — M5412 Radiculopathy, cervical region: Secondary | ICD-10-CM | POA: Diagnosis not present

## 2018-11-28 DIAGNOSIS — M62512 Muscle wasting and atrophy, not elsewhere classified, left shoulder: Secondary | ICD-10-CM | POA: Diagnosis not present

## 2018-11-28 DIAGNOSIS — M25612 Stiffness of left shoulder, not elsewhere classified: Secondary | ICD-10-CM | POA: Diagnosis not present

## 2018-12-04 DIAGNOSIS — E538 Deficiency of other specified B group vitamins: Secondary | ICD-10-CM | POA: Diagnosis not present

## 2018-12-11 DIAGNOSIS — M25512 Pain in left shoulder: Secondary | ICD-10-CM | POA: Diagnosis not present

## 2018-12-11 DIAGNOSIS — M25612 Stiffness of left shoulder, not elsewhere classified: Secondary | ICD-10-CM | POA: Diagnosis not present

## 2018-12-11 DIAGNOSIS — M62512 Muscle wasting and atrophy, not elsewhere classified, left shoulder: Secondary | ICD-10-CM | POA: Diagnosis not present

## 2018-12-11 DIAGNOSIS — M5412 Radiculopathy, cervical region: Secondary | ICD-10-CM | POA: Diagnosis not present

## 2018-12-13 DIAGNOSIS — M25512 Pain in left shoulder: Secondary | ICD-10-CM | POA: Diagnosis not present

## 2018-12-13 DIAGNOSIS — M25612 Stiffness of left shoulder, not elsewhere classified: Secondary | ICD-10-CM | POA: Diagnosis not present

## 2018-12-13 DIAGNOSIS — M5412 Radiculopathy, cervical region: Secondary | ICD-10-CM | POA: Diagnosis not present

## 2018-12-13 DIAGNOSIS — M62512 Muscle wasting and atrophy, not elsewhere classified, left shoulder: Secondary | ICD-10-CM | POA: Diagnosis not present

## 2018-12-18 DIAGNOSIS — M25612 Stiffness of left shoulder, not elsewhere classified: Secondary | ICD-10-CM | POA: Diagnosis not present

## 2018-12-18 DIAGNOSIS — M62512 Muscle wasting and atrophy, not elsewhere classified, left shoulder: Secondary | ICD-10-CM | POA: Diagnosis not present

## 2018-12-18 DIAGNOSIS — M5412 Radiculopathy, cervical region: Secondary | ICD-10-CM | POA: Diagnosis not present

## 2018-12-18 DIAGNOSIS — M25512 Pain in left shoulder: Secondary | ICD-10-CM | POA: Diagnosis not present

## 2018-12-20 DIAGNOSIS — M62512 Muscle wasting and atrophy, not elsewhere classified, left shoulder: Secondary | ICD-10-CM | POA: Diagnosis not present

## 2018-12-20 DIAGNOSIS — M5412 Radiculopathy, cervical region: Secondary | ICD-10-CM | POA: Diagnosis not present

## 2018-12-20 DIAGNOSIS — M25512 Pain in left shoulder: Secondary | ICD-10-CM | POA: Diagnosis not present

## 2018-12-20 DIAGNOSIS — M25612 Stiffness of left shoulder, not elsewhere classified: Secondary | ICD-10-CM | POA: Diagnosis not present

## 2018-12-25 DIAGNOSIS — M5412 Radiculopathy, cervical region: Secondary | ICD-10-CM | POA: Diagnosis not present

## 2018-12-25 DIAGNOSIS — M25612 Stiffness of left shoulder, not elsewhere classified: Secondary | ICD-10-CM | POA: Diagnosis not present

## 2018-12-25 DIAGNOSIS — M62512 Muscle wasting and atrophy, not elsewhere classified, left shoulder: Secondary | ICD-10-CM | POA: Diagnosis not present

## 2018-12-25 DIAGNOSIS — M25512 Pain in left shoulder: Secondary | ICD-10-CM | POA: Diagnosis not present

## 2018-12-27 DIAGNOSIS — M25612 Stiffness of left shoulder, not elsewhere classified: Secondary | ICD-10-CM | POA: Diagnosis not present

## 2018-12-27 DIAGNOSIS — M62512 Muscle wasting and atrophy, not elsewhere classified, left shoulder: Secondary | ICD-10-CM | POA: Diagnosis not present

## 2018-12-27 DIAGNOSIS — M25512 Pain in left shoulder: Secondary | ICD-10-CM | POA: Diagnosis not present

## 2018-12-27 DIAGNOSIS — M5412 Radiculopathy, cervical region: Secondary | ICD-10-CM | POA: Diagnosis not present

## 2019-01-01 DIAGNOSIS — M25612 Stiffness of left shoulder, not elsewhere classified: Secondary | ICD-10-CM | POA: Diagnosis not present

## 2019-01-01 DIAGNOSIS — M25512 Pain in left shoulder: Secondary | ICD-10-CM | POA: Diagnosis not present

## 2019-01-01 DIAGNOSIS — M5412 Radiculopathy, cervical region: Secondary | ICD-10-CM | POA: Diagnosis not present

## 2019-01-01 DIAGNOSIS — M62512 Muscle wasting and atrophy, not elsewhere classified, left shoulder: Secondary | ICD-10-CM | POA: Diagnosis not present

## 2019-01-02 DIAGNOSIS — E538 Deficiency of other specified B group vitamins: Secondary | ICD-10-CM | POA: Diagnosis not present

## 2019-01-03 DIAGNOSIS — E119 Type 2 diabetes mellitus without complications: Secondary | ICD-10-CM | POA: Diagnosis not present

## 2019-01-03 DIAGNOSIS — M62512 Muscle wasting and atrophy, not elsewhere classified, left shoulder: Secondary | ICD-10-CM | POA: Diagnosis not present

## 2019-01-03 DIAGNOSIS — R202 Paresthesia of skin: Secondary | ICD-10-CM | POA: Diagnosis not present

## 2019-01-03 DIAGNOSIS — M25612 Stiffness of left shoulder, not elsewhere classified: Secondary | ICD-10-CM | POA: Diagnosis not present

## 2019-01-03 DIAGNOSIS — M5412 Radiculopathy, cervical region: Secondary | ICD-10-CM | POA: Diagnosis not present

## 2019-01-03 DIAGNOSIS — M25512 Pain in left shoulder: Secondary | ICD-10-CM | POA: Diagnosis not present

## 2019-01-08 DIAGNOSIS — M5412 Radiculopathy, cervical region: Secondary | ICD-10-CM | POA: Diagnosis not present

## 2019-01-08 DIAGNOSIS — M25512 Pain in left shoulder: Secondary | ICD-10-CM | POA: Diagnosis not present

## 2019-01-08 DIAGNOSIS — M25612 Stiffness of left shoulder, not elsewhere classified: Secondary | ICD-10-CM | POA: Diagnosis not present

## 2019-01-08 DIAGNOSIS — M62512 Muscle wasting and atrophy, not elsewhere classified, left shoulder: Secondary | ICD-10-CM | POA: Diagnosis not present

## 2019-01-10 DIAGNOSIS — M25512 Pain in left shoulder: Secondary | ICD-10-CM | POA: Diagnosis not present

## 2019-01-10 DIAGNOSIS — M25612 Stiffness of left shoulder, not elsewhere classified: Secondary | ICD-10-CM | POA: Diagnosis not present

## 2019-01-10 DIAGNOSIS — M62512 Muscle wasting and atrophy, not elsewhere classified, left shoulder: Secondary | ICD-10-CM | POA: Diagnosis not present

## 2019-01-10 DIAGNOSIS — M5412 Radiculopathy, cervical region: Secondary | ICD-10-CM | POA: Diagnosis not present

## 2019-01-15 DIAGNOSIS — M25612 Stiffness of left shoulder, not elsewhere classified: Secondary | ICD-10-CM | POA: Diagnosis not present

## 2019-01-15 DIAGNOSIS — M25512 Pain in left shoulder: Secondary | ICD-10-CM | POA: Diagnosis not present

## 2019-01-15 DIAGNOSIS — M62512 Muscle wasting and atrophy, not elsewhere classified, left shoulder: Secondary | ICD-10-CM | POA: Diagnosis not present

## 2019-01-15 DIAGNOSIS — M5412 Radiculopathy, cervical region: Secondary | ICD-10-CM | POA: Diagnosis not present

## 2019-01-17 DIAGNOSIS — M25612 Stiffness of left shoulder, not elsewhere classified: Secondary | ICD-10-CM | POA: Diagnosis not present

## 2019-01-17 DIAGNOSIS — M62512 Muscle wasting and atrophy, not elsewhere classified, left shoulder: Secondary | ICD-10-CM | POA: Diagnosis not present

## 2019-01-17 DIAGNOSIS — M25512 Pain in left shoulder: Secondary | ICD-10-CM | POA: Diagnosis not present

## 2019-01-17 DIAGNOSIS — M5412 Radiculopathy, cervical region: Secondary | ICD-10-CM | POA: Diagnosis not present

## 2019-01-22 DIAGNOSIS — M62512 Muscle wasting and atrophy, not elsewhere classified, left shoulder: Secondary | ICD-10-CM | POA: Diagnosis not present

## 2019-01-22 DIAGNOSIS — M5412 Radiculopathy, cervical region: Secondary | ICD-10-CM | POA: Diagnosis not present

## 2019-01-22 DIAGNOSIS — M25612 Stiffness of left shoulder, not elsewhere classified: Secondary | ICD-10-CM | POA: Diagnosis not present

## 2019-01-22 DIAGNOSIS — M25512 Pain in left shoulder: Secondary | ICD-10-CM | POA: Diagnosis not present

## 2019-01-24 DIAGNOSIS — M25612 Stiffness of left shoulder, not elsewhere classified: Secondary | ICD-10-CM | POA: Diagnosis not present

## 2019-01-24 DIAGNOSIS — M25512 Pain in left shoulder: Secondary | ICD-10-CM | POA: Diagnosis not present

## 2019-01-24 DIAGNOSIS — M62512 Muscle wasting and atrophy, not elsewhere classified, left shoulder: Secondary | ICD-10-CM | POA: Diagnosis not present

## 2019-01-24 DIAGNOSIS — M5412 Radiculopathy, cervical region: Secondary | ICD-10-CM | POA: Diagnosis not present

## 2019-01-29 DIAGNOSIS — M62512 Muscle wasting and atrophy, not elsewhere classified, left shoulder: Secondary | ICD-10-CM | POA: Diagnosis not present

## 2019-01-29 DIAGNOSIS — M25512 Pain in left shoulder: Secondary | ICD-10-CM | POA: Diagnosis not present

## 2019-01-29 DIAGNOSIS — M25612 Stiffness of left shoulder, not elsewhere classified: Secondary | ICD-10-CM | POA: Diagnosis not present

## 2019-01-29 DIAGNOSIS — M5412 Radiculopathy, cervical region: Secondary | ICD-10-CM | POA: Diagnosis not present

## 2019-01-31 DIAGNOSIS — E538 Deficiency of other specified B group vitamins: Secondary | ICD-10-CM | POA: Diagnosis not present

## 2019-02-01 DIAGNOSIS — M62512 Muscle wasting and atrophy, not elsewhere classified, left shoulder: Secondary | ICD-10-CM | POA: Diagnosis not present

## 2019-02-01 DIAGNOSIS — M25512 Pain in left shoulder: Secondary | ICD-10-CM | POA: Diagnosis not present

## 2019-02-01 DIAGNOSIS — M25612 Stiffness of left shoulder, not elsewhere classified: Secondary | ICD-10-CM | POA: Diagnosis not present

## 2019-02-01 DIAGNOSIS — M5412 Radiculopathy, cervical region: Secondary | ICD-10-CM | POA: Diagnosis not present

## 2019-02-05 DIAGNOSIS — M62512 Muscle wasting and atrophy, not elsewhere classified, left shoulder: Secondary | ICD-10-CM | POA: Diagnosis not present

## 2019-02-05 DIAGNOSIS — M25612 Stiffness of left shoulder, not elsewhere classified: Secondary | ICD-10-CM | POA: Diagnosis not present

## 2019-02-05 DIAGNOSIS — M5412 Radiculopathy, cervical region: Secondary | ICD-10-CM | POA: Diagnosis not present

## 2019-02-05 DIAGNOSIS — M25512 Pain in left shoulder: Secondary | ICD-10-CM | POA: Diagnosis not present

## 2019-02-07 DIAGNOSIS — M62512 Muscle wasting and atrophy, not elsewhere classified, left shoulder: Secondary | ICD-10-CM | POA: Diagnosis not present

## 2019-02-07 DIAGNOSIS — M25612 Stiffness of left shoulder, not elsewhere classified: Secondary | ICD-10-CM | POA: Diagnosis not present

## 2019-02-07 DIAGNOSIS — M25512 Pain in left shoulder: Secondary | ICD-10-CM | POA: Diagnosis not present

## 2019-02-07 DIAGNOSIS — M5412 Radiculopathy, cervical region: Secondary | ICD-10-CM | POA: Diagnosis not present

## 2019-02-12 DIAGNOSIS — M25612 Stiffness of left shoulder, not elsewhere classified: Secondary | ICD-10-CM | POA: Diagnosis not present

## 2019-02-12 DIAGNOSIS — M25512 Pain in left shoulder: Secondary | ICD-10-CM | POA: Diagnosis not present

## 2019-02-12 DIAGNOSIS — M5412 Radiculopathy, cervical region: Secondary | ICD-10-CM | POA: Diagnosis not present

## 2019-02-12 DIAGNOSIS — M62512 Muscle wasting and atrophy, not elsewhere classified, left shoulder: Secondary | ICD-10-CM | POA: Diagnosis not present

## 2019-02-14 DIAGNOSIS — M62512 Muscle wasting and atrophy, not elsewhere classified, left shoulder: Secondary | ICD-10-CM | POA: Diagnosis not present

## 2019-02-14 DIAGNOSIS — M25612 Stiffness of left shoulder, not elsewhere classified: Secondary | ICD-10-CM | POA: Diagnosis not present

## 2019-02-14 DIAGNOSIS — M5412 Radiculopathy, cervical region: Secondary | ICD-10-CM | POA: Diagnosis not present

## 2019-02-14 DIAGNOSIS — M25512 Pain in left shoulder: Secondary | ICD-10-CM | POA: Diagnosis not present

## 2019-02-19 DIAGNOSIS — M5412 Radiculopathy, cervical region: Secondary | ICD-10-CM | POA: Diagnosis not present

## 2019-02-19 DIAGNOSIS — M25512 Pain in left shoulder: Secondary | ICD-10-CM | POA: Diagnosis not present

## 2019-02-19 DIAGNOSIS — M62512 Muscle wasting and atrophy, not elsewhere classified, left shoulder: Secondary | ICD-10-CM | POA: Diagnosis not present

## 2019-02-19 DIAGNOSIS — M25612 Stiffness of left shoulder, not elsewhere classified: Secondary | ICD-10-CM | POA: Diagnosis not present

## 2019-02-21 DIAGNOSIS — M62512 Muscle wasting and atrophy, not elsewhere classified, left shoulder: Secondary | ICD-10-CM | POA: Diagnosis not present

## 2019-02-21 DIAGNOSIS — M25512 Pain in left shoulder: Secondary | ICD-10-CM | POA: Diagnosis not present

## 2019-02-21 DIAGNOSIS — M25612 Stiffness of left shoulder, not elsewhere classified: Secondary | ICD-10-CM | POA: Diagnosis not present

## 2019-02-21 DIAGNOSIS — M5412 Radiculopathy, cervical region: Secondary | ICD-10-CM | POA: Diagnosis not present

## 2019-02-26 DIAGNOSIS — M25512 Pain in left shoulder: Secondary | ICD-10-CM | POA: Diagnosis not present

## 2019-02-26 DIAGNOSIS — M25612 Stiffness of left shoulder, not elsewhere classified: Secondary | ICD-10-CM | POA: Diagnosis not present

## 2019-02-26 DIAGNOSIS — M62512 Muscle wasting and atrophy, not elsewhere classified, left shoulder: Secondary | ICD-10-CM | POA: Diagnosis not present

## 2019-02-26 DIAGNOSIS — M5412 Radiculopathy, cervical region: Secondary | ICD-10-CM | POA: Diagnosis not present

## 2019-02-28 DIAGNOSIS — M5412 Radiculopathy, cervical region: Secondary | ICD-10-CM | POA: Diagnosis not present

## 2019-02-28 DIAGNOSIS — M25612 Stiffness of left shoulder, not elsewhere classified: Secondary | ICD-10-CM | POA: Diagnosis not present

## 2019-02-28 DIAGNOSIS — M62512 Muscle wasting and atrophy, not elsewhere classified, left shoulder: Secondary | ICD-10-CM | POA: Diagnosis not present

## 2019-02-28 DIAGNOSIS — M25512 Pain in left shoulder: Secondary | ICD-10-CM | POA: Diagnosis not present

## 2019-03-01 DIAGNOSIS — E538 Deficiency of other specified B group vitamins: Secondary | ICD-10-CM | POA: Diagnosis not present

## 2019-03-04 DIAGNOSIS — R202 Paresthesia of skin: Secondary | ICD-10-CM | POA: Diagnosis not present

## 2019-03-04 DIAGNOSIS — E119 Type 2 diabetes mellitus without complications: Secondary | ICD-10-CM | POA: Diagnosis not present

## 2019-03-05 DIAGNOSIS — M25612 Stiffness of left shoulder, not elsewhere classified: Secondary | ICD-10-CM | POA: Diagnosis not present

## 2019-03-05 DIAGNOSIS — M5412 Radiculopathy, cervical region: Secondary | ICD-10-CM | POA: Diagnosis not present

## 2019-03-05 DIAGNOSIS — M62512 Muscle wasting and atrophy, not elsewhere classified, left shoulder: Secondary | ICD-10-CM | POA: Diagnosis not present

## 2019-03-05 DIAGNOSIS — M25512 Pain in left shoulder: Secondary | ICD-10-CM | POA: Diagnosis not present

## 2019-03-08 DIAGNOSIS — E119 Type 2 diabetes mellitus without complications: Secondary | ICD-10-CM | POA: Diagnosis not present

## 2019-03-08 DIAGNOSIS — R202 Paresthesia of skin: Secondary | ICD-10-CM | POA: Diagnosis not present

## 2019-03-29 DIAGNOSIS — E538 Deficiency of other specified B group vitamins: Secondary | ICD-10-CM | POA: Diagnosis not present

## 2019-04-29 DIAGNOSIS — N6489 Other specified disorders of breast: Secondary | ICD-10-CM | POA: Diagnosis not present

## 2019-04-29 DIAGNOSIS — R921 Mammographic calcification found on diagnostic imaging of breast: Secondary | ICD-10-CM | POA: Diagnosis not present

## 2019-04-29 DIAGNOSIS — R928 Other abnormal and inconclusive findings on diagnostic imaging of breast: Secondary | ICD-10-CM | POA: Diagnosis not present

## 2019-05-01 DIAGNOSIS — E538 Deficiency of other specified B group vitamins: Secondary | ICD-10-CM | POA: Diagnosis not present

## 2019-05-08 DIAGNOSIS — E1142 Type 2 diabetes mellitus with diabetic polyneuropathy: Secondary | ICD-10-CM | POA: Diagnosis not present

## 2019-05-08 DIAGNOSIS — E11621 Type 2 diabetes mellitus with foot ulcer: Secondary | ICD-10-CM | POA: Diagnosis not present

## 2019-05-08 DIAGNOSIS — L97522 Non-pressure chronic ulcer of other part of left foot with fat layer exposed: Secondary | ICD-10-CM | POA: Diagnosis not present

## 2019-05-08 DIAGNOSIS — L03032 Cellulitis of left toe: Secondary | ICD-10-CM | POA: Diagnosis not present

## 2019-05-15 DIAGNOSIS — L97522 Non-pressure chronic ulcer of other part of left foot with fat layer exposed: Secondary | ICD-10-CM | POA: Diagnosis not present

## 2019-05-15 DIAGNOSIS — E11621 Type 2 diabetes mellitus with foot ulcer: Secondary | ICD-10-CM | POA: Diagnosis not present

## 2019-05-15 DIAGNOSIS — E1142 Type 2 diabetes mellitus with diabetic polyneuropathy: Secondary | ICD-10-CM | POA: Diagnosis not present

## 2019-05-15 DIAGNOSIS — L03032 Cellulitis of left toe: Secondary | ICD-10-CM | POA: Diagnosis not present

## 2019-05-22 DIAGNOSIS — E1142 Type 2 diabetes mellitus with diabetic polyneuropathy: Secondary | ICD-10-CM | POA: Diagnosis not present

## 2019-05-22 DIAGNOSIS — E11621 Type 2 diabetes mellitus with foot ulcer: Secondary | ICD-10-CM | POA: Diagnosis not present

## 2019-05-22 DIAGNOSIS — L03032 Cellulitis of left toe: Secondary | ICD-10-CM | POA: Diagnosis not present

## 2019-05-22 DIAGNOSIS — Z7984 Long term (current) use of oral hypoglycemic drugs: Secondary | ICD-10-CM | POA: Diagnosis not present

## 2019-05-22 DIAGNOSIS — L97522 Non-pressure chronic ulcer of other part of left foot with fat layer exposed: Secondary | ICD-10-CM | POA: Diagnosis not present

## 2019-05-29 DIAGNOSIS — G5601 Carpal tunnel syndrome, right upper limb: Secondary | ICD-10-CM | POA: Diagnosis not present

## 2019-05-29 DIAGNOSIS — R202 Paresthesia of skin: Secondary | ICD-10-CM | POA: Diagnosis not present

## 2019-05-29 DIAGNOSIS — L97521 Non-pressure chronic ulcer of other part of left foot limited to breakdown of skin: Secondary | ICD-10-CM | POA: Diagnosis not present

## 2019-05-29 DIAGNOSIS — E1142 Type 2 diabetes mellitus with diabetic polyneuropathy: Secondary | ICD-10-CM | POA: Diagnosis not present

## 2019-05-29 DIAGNOSIS — L03032 Cellulitis of left toe: Secondary | ICD-10-CM | POA: Diagnosis not present

## 2019-06-05 DIAGNOSIS — I10 Essential (primary) hypertension: Secondary | ICD-10-CM | POA: Diagnosis not present

## 2019-06-05 DIAGNOSIS — Z79899 Other long term (current) drug therapy: Secondary | ICD-10-CM | POA: Diagnosis not present

## 2019-06-05 DIAGNOSIS — E119 Type 2 diabetes mellitus without complications: Secondary | ICD-10-CM | POA: Diagnosis not present

## 2019-06-05 DIAGNOSIS — E538 Deficiency of other specified B group vitamins: Secondary | ICD-10-CM | POA: Diagnosis not present

## 2019-06-05 DIAGNOSIS — Z7984 Long term (current) use of oral hypoglycemic drugs: Secondary | ICD-10-CM | POA: Diagnosis not present

## 2019-06-05 DIAGNOSIS — M1990 Unspecified osteoarthritis, unspecified site: Secondary | ICD-10-CM | POA: Diagnosis not present

## 2019-06-05 DIAGNOSIS — E7800 Pure hypercholesterolemia, unspecified: Secondary | ICD-10-CM | POA: Diagnosis not present

## 2019-06-19 DIAGNOSIS — L97521 Non-pressure chronic ulcer of other part of left foot limited to breakdown of skin: Secondary | ICD-10-CM | POA: Diagnosis not present

## 2019-06-19 DIAGNOSIS — E1142 Type 2 diabetes mellitus with diabetic polyneuropathy: Secondary | ICD-10-CM | POA: Diagnosis not present

## 2019-07-03 DIAGNOSIS — E538 Deficiency of other specified B group vitamins: Secondary | ICD-10-CM | POA: Diagnosis not present

## 2019-08-01 DIAGNOSIS — E538 Deficiency of other specified B group vitamins: Secondary | ICD-10-CM | POA: Diagnosis not present

## 2019-09-03 DIAGNOSIS — E538 Deficiency of other specified B group vitamins: Secondary | ICD-10-CM | POA: Diagnosis not present

## 2019-11-04 DIAGNOSIS — E538 Deficiency of other specified B group vitamins: Secondary | ICD-10-CM | POA: Diagnosis not present

## 2019-11-25 DIAGNOSIS — M2020 Hallux rigidus, unspecified foot: Secondary | ICD-10-CM | POA: Diagnosis not present

## 2019-11-25 DIAGNOSIS — E119 Type 2 diabetes mellitus without complications: Secondary | ICD-10-CM | POA: Diagnosis not present

## 2019-11-25 DIAGNOSIS — E1142 Type 2 diabetes mellitus with diabetic polyneuropathy: Secondary | ICD-10-CM | POA: Diagnosis not present

## 2019-12-03 DIAGNOSIS — E538 Deficiency of other specified B group vitamins: Secondary | ICD-10-CM | POA: Diagnosis not present

## 2019-12-30 DIAGNOSIS — Z0001 Encounter for general adult medical examination with abnormal findings: Secondary | ICD-10-CM | POA: Diagnosis not present

## 2019-12-30 DIAGNOSIS — E1169 Type 2 diabetes mellitus with other specified complication: Secondary | ICD-10-CM | POA: Diagnosis not present

## 2019-12-30 DIAGNOSIS — Z79899 Other long term (current) drug therapy: Secondary | ICD-10-CM | POA: Diagnosis not present

## 2019-12-30 DIAGNOSIS — L989 Disorder of the skin and subcutaneous tissue, unspecified: Secondary | ICD-10-CM | POA: Diagnosis not present

## 2019-12-30 DIAGNOSIS — I10 Essential (primary) hypertension: Secondary | ICD-10-CM | POA: Diagnosis not present

## 2019-12-30 DIAGNOSIS — M1990 Unspecified osteoarthritis, unspecified site: Secondary | ICD-10-CM | POA: Diagnosis not present

## 2019-12-30 DIAGNOSIS — E7800 Pure hypercholesterolemia, unspecified: Secondary | ICD-10-CM | POA: Diagnosis not present

## 2019-12-30 DIAGNOSIS — E538 Deficiency of other specified B group vitamins: Secondary | ICD-10-CM | POA: Diagnosis not present

## 2020-01-02 DIAGNOSIS — Z0001 Encounter for general adult medical examination with abnormal findings: Secondary | ICD-10-CM | POA: Diagnosis not present

## 2020-01-30 DIAGNOSIS — E538 Deficiency of other specified B group vitamins: Secondary | ICD-10-CM | POA: Diagnosis not present

## 2020-02-05 DIAGNOSIS — C4441 Basal cell carcinoma of skin of scalp and neck: Secondary | ICD-10-CM | POA: Diagnosis not present

## 2020-02-05 DIAGNOSIS — C44319 Basal cell carcinoma of skin of other parts of face: Secondary | ICD-10-CM | POA: Diagnosis not present

## 2020-02-05 DIAGNOSIS — D485 Neoplasm of uncertain behavior of skin: Secondary | ICD-10-CM | POA: Diagnosis not present

## 2020-02-05 DIAGNOSIS — L905 Scar conditions and fibrosis of skin: Secondary | ICD-10-CM | POA: Diagnosis not present

## 2020-02-05 DIAGNOSIS — D225 Melanocytic nevi of trunk: Secondary | ICD-10-CM | POA: Diagnosis not present

## 2020-02-05 DIAGNOSIS — L814 Other melanin hyperpigmentation: Secondary | ICD-10-CM | POA: Diagnosis not present

## 2020-02-05 DIAGNOSIS — Z85828 Personal history of other malignant neoplasm of skin: Secondary | ICD-10-CM | POA: Diagnosis not present

## 2020-02-05 DIAGNOSIS — L821 Other seborrheic keratosis: Secondary | ICD-10-CM | POA: Diagnosis not present

## 2020-02-05 DIAGNOSIS — C44311 Basal cell carcinoma of skin of nose: Secondary | ICD-10-CM | POA: Diagnosis not present

## 2020-02-05 DIAGNOSIS — L739 Follicular disorder, unspecified: Secondary | ICD-10-CM | POA: Diagnosis not present

## 2020-02-05 DIAGNOSIS — L300 Nummular dermatitis: Secondary | ICD-10-CM | POA: Diagnosis not present

## 2020-03-03 DIAGNOSIS — E538 Deficiency of other specified B group vitamins: Secondary | ICD-10-CM | POA: Diagnosis not present

## 2020-03-10 DIAGNOSIS — Z23 Encounter for immunization: Secondary | ICD-10-CM | POA: Diagnosis not present

## 2020-03-23 DIAGNOSIS — C44311 Basal cell carcinoma of skin of nose: Secondary | ICD-10-CM | POA: Diagnosis not present

## 2020-03-23 DIAGNOSIS — C44319 Basal cell carcinoma of skin of other parts of face: Secondary | ICD-10-CM | POA: Diagnosis not present

## 2020-03-31 DIAGNOSIS — E538 Deficiency of other specified B group vitamins: Secondary | ICD-10-CM | POA: Diagnosis not present

## 2020-04-30 DIAGNOSIS — E538 Deficiency of other specified B group vitamins: Secondary | ICD-10-CM | POA: Diagnosis not present

## 2020-05-07 DIAGNOSIS — D485 Neoplasm of uncertain behavior of skin: Secondary | ICD-10-CM | POA: Diagnosis not present

## 2020-05-07 DIAGNOSIS — C44311 Basal cell carcinoma of skin of nose: Secondary | ICD-10-CM | POA: Diagnosis not present

## 2020-05-07 DIAGNOSIS — C44319 Basal cell carcinoma of skin of other parts of face: Secondary | ICD-10-CM | POA: Diagnosis not present

## 2020-05-19 DIAGNOSIS — C44311 Basal cell carcinoma of skin of nose: Secondary | ICD-10-CM | POA: Diagnosis not present

## 2020-05-25 DIAGNOSIS — E119 Type 2 diabetes mellitus without complications: Secondary | ICD-10-CM | POA: Diagnosis not present

## 2020-05-25 DIAGNOSIS — M2020 Hallux rigidus, unspecified foot: Secondary | ICD-10-CM | POA: Diagnosis not present

## 2020-05-25 DIAGNOSIS — E1142 Type 2 diabetes mellitus with diabetic polyneuropathy: Secondary | ICD-10-CM | POA: Diagnosis not present

## 2020-06-01 DIAGNOSIS — E538 Deficiency of other specified B group vitamins: Secondary | ICD-10-CM | POA: Diagnosis not present

## 2020-06-08 DIAGNOSIS — L905 Scar conditions and fibrosis of skin: Secondary | ICD-10-CM | POA: Diagnosis not present

## 2020-06-08 DIAGNOSIS — Z85828 Personal history of other malignant neoplasm of skin: Secondary | ICD-10-CM | POA: Diagnosis not present

## 2020-06-08 DIAGNOSIS — D1801 Hemangioma of skin and subcutaneous tissue: Secondary | ICD-10-CM | POA: Diagnosis not present

## 2020-06-08 DIAGNOSIS — L814 Other melanin hyperpigmentation: Secondary | ICD-10-CM | POA: Diagnosis not present

## 2020-06-08 DIAGNOSIS — L821 Other seborrheic keratosis: Secondary | ICD-10-CM | POA: Diagnosis not present

## 2020-06-08 DIAGNOSIS — L570 Actinic keratosis: Secondary | ICD-10-CM | POA: Diagnosis not present

## 2020-06-08 DIAGNOSIS — D225 Melanocytic nevi of trunk: Secondary | ICD-10-CM | POA: Diagnosis not present

## 2020-06-10 DIAGNOSIS — C44319 Basal cell carcinoma of skin of other parts of face: Secondary | ICD-10-CM | POA: Diagnosis not present

## 2020-07-01 DIAGNOSIS — Z79899 Other long term (current) drug therapy: Secondary | ICD-10-CM | POA: Diagnosis not present

## 2020-07-01 DIAGNOSIS — I10 Essential (primary) hypertension: Secondary | ICD-10-CM | POA: Diagnosis not present

## 2020-07-01 DIAGNOSIS — E538 Deficiency of other specified B group vitamins: Secondary | ICD-10-CM | POA: Diagnosis not present

## 2020-07-01 DIAGNOSIS — E119 Type 2 diabetes mellitus without complications: Secondary | ICD-10-CM | POA: Diagnosis not present

## 2020-07-31 DIAGNOSIS — E538 Deficiency of other specified B group vitamins: Secondary | ICD-10-CM | POA: Diagnosis not present

## 2020-08-19 ENCOUNTER — Other Ambulatory Visit: Payer: Self-pay

## 2020-08-19 ENCOUNTER — Ambulatory Visit (INDEPENDENT_AMBULATORY_CARE_PROVIDER_SITE_OTHER): Payer: Medicare Other | Admitting: Obstetrics and Gynecology

## 2020-08-19 ENCOUNTER — Encounter: Payer: Self-pay | Admitting: Obstetrics and Gynecology

## 2020-08-19 VITALS — BP 141/75 | HR 80 | Ht 63.0 in | Wt 170.0 lb

## 2020-08-19 DIAGNOSIS — N8111 Cystocele, midline: Secondary | ICD-10-CM

## 2020-08-19 NOTE — Progress Notes (Signed)
GYNECOLOGY OFFICE NOTE  History:  74 y.o. [id number] here today for prolapse. 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 No issues with voiding or stooling. Has not tried splinting, unsure where to push. No other complaints.   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  Past Medical History:  Diagnosis Date  . Skin cancer   . Vaginal Pap smear, abnormal 1978   hysterectomy    Past Surgical History:  Procedure Laterality Date  . ABDOMINAL HYSTERECTOMY  1977  . BREAST REDUCTION SURGERY  1985  . Illectomy  02/1998  . left foot reconstruction  07/2011     Current Outpatient Medications:  .  calcium carbonate 100 mg/ml SUSP, Take by mouth., Disp: , Rfl:  .  celecoxib (CELEBREX) 200 MG capsule, TAKE 1 CAPSULE EVERY DAY WITH FOOD, Disp: , Rfl:  .  CONTOUR NEXT TEST test strip, SMARTSIG:Via Meter, Disp: , Rfl:  .  furosemide (LASIX) 20 MG tablet, Take 20 mg by mouth daily., Disp: , Rfl:  .  gabapentin (NEURONTIN) 300 MG capsule, Take 300 mg by mouth daily., Disp: , Rfl:  .  glimepiride (AMARYL) 1 MG tablet, Take 1 mg by mouth daily., Disp: , Rfl:  .  metFORMIN (GLUCOPHAGE) 500 MG tablet, Take 1,000 mg by mouth 2 (two) times daily., Disp: , Rfl:  .  montelukast (SINGULAIR) 10 MG tablet, Take 10 mg by mouth daily., Disp: , Rfl:  .  simvastatin (ZOCOR) 20 MG tablet, Take 20 mg by mouth at bedtime., Disp: , Rfl:  .  valsartan (DIOVAN) 80 MG tablet, Take 80 mg by mouth daily., Disp: , Rfl:   The following portions of the patient's history were reviewed and updated as appropriate: allergies, current medications, past family history, past medical history, past social history, past surgical history and problem list.   Review of Systems:  Pertinent items noted in HPI and remainder of  comprehensive ROS otherwise negative.   Objective:  Physical Exam BP (!) 141/75   Pulse 80   Ht 5' 3"  (1.6 m)   Wt 170 lb (77.1 kg)   BMI 30.11 kg/m  CONSTITUTIONAL: Well-developed, well-nourished female in no acute distress.  HENT:  Normocephalic, atraumatic. External right and left ear normal. Oropharynx is clear and moist EYES: Conjunctivae and EOM are normal. Pupils are equal, round, and reactive to light. No scleral icterus.  NECK: Normal range of motion, supple, no masses SKIN: Skin is warm and dry. No rash noted. Not diaphoretic. No erythema. No pallor. NEUROLOGIC: Alert and oriented to person, place, and time. Normal reflexes, muscle tone coordination. No cranial nerve deficit noted. PSYCHIATRIC: Normal mood and affect. Normal behavior. Normal judgment and thought content. CARDIOVASCULAR: Normal heart rate noted RESPIRATORY: Effort normal, no problems with respiration noted ABDOMEN: Soft, no distention noted.   PELVIC: Normal appearing external genitalia; mildly atrophic vaginal mucosa, cervix surgically absent, Grade III cystocele.  No abnormal discharge noted.  No vaginal tenderness. MUSCULOSKELETAL: Normal range of motion. No edema noted.  Exam done with chaperone present.  Labs and Imaging No results found.  Assessment & Plan:   1. Cystocele, midline Briefly reviewed options for management including pessary and surgery, recommend referral to Urogyn, she is agreeable - Ambulatory referral to Urogynecology   Routine preventative health maintenance  measures emphasized. Please refer to After Visit Summary for other counseling recommendations.   Return in about 1 year (around 08/19/2021), or if symptoms worsen or fail to improve.  Total face-to-face time with patient: 25 minutes. Over 50% of encounter was spent on counseling and coordination of care.  Feliz Beam, M.D. Attending Center for [employer])

## 2020-08-19 NOTE — Patient Instructions (Signed)
Pelvic Organ Prolapse Pelvic organ prolapse is the stretching, bulging, or dropping of pelvic organs into an abnormal position. It happens when the muscles and tissues that surround and support pelvic structures become weak or stretched. Pelvic organ prolapse can involve the:  Vagina (vaginal prolapse).  Uterus (uterine prolapse).  Bladder (cystocele).  Rectum (rectocele).  Intestines (enterocele). When organs other than the vagina are involved, they often bulge into the vagina or protrude from the vagina, depending on how severe the prolapse is. What are the causes? This condition may be caused by:  Pregnancy, labor, and childbirth.  Past pelvic surgery.  Decreased production of the hormone estrogen associated with menopause.  Consistently lifting more than 50 lb (23 kg).  Obesity.  Long-term inability to pass stool (chronic constipation).  A cough that lasts a long time (chronic).  Buildup of fluid in the abdomen due to certain diseases and other conditions. What are the signs or symptoms? Symptoms of this condition include:  Passing a little urine (loss of bladder control) when you cough, sneeze, strain, and exercise (stress incontinence). This may be worse immediately after childbirth. It may gradually improve over time.  Feeling pressure in your pelvis or vagina. This pressure may increase when you cough or when you are passing stool.  A bulge that protrudes from the opening of your vagina.  Difficulty passing urine or stool.  Pain in your lower back.  Pain, discomfort, or disinterest in sex.  Repeated bladder infections (urinary tract infections).  Difficulty inserting a tampon. In some people, this condition causes no symptoms. How is this diagnosed? This condition may be diagnosed based on a vaginal and rectal exam. During the exam, you may be asked to cough and strain while you are lying down, sitting, and standing up. Your health care provider will  determine if other tests are required, such as bladder function tests. How is this treated? Treatment for this condition may depend on your symptoms. Treatment may include:  Lifestyle changes, such as changes to your diet.  Emptying your bladder at scheduled times (bladder training therapy). This can help reduce or avoid urinary incontinence.  Estrogen. Estrogen may help mild prolapse by increasing the strength and tone of pelvic floor muscles.  Kegel exercises. These may help mild cases of prolapse by strengthening and tightening the muscles of the pelvic floor.  A soft, flexible device that helps support the vaginal walls and keep pelvic organs in place (pessary). This is inserted into your vagina by your health care provider.  Surgery. This is often the only form of treatment for severe prolapse. Follow these instructions at home:  Avoid drinking beverages that contain caffeine or alcohol.  Increase your intake of high-fiber foods. This can help decrease constipation and straining during bowel movements.  Lose weight if recommended by your health care provider.  Wear a sanitary pad or adult diapers if you have urinary incontinence.  Avoid heavy lifting and straining with exercise and work. Do not hold your breath when you perform mild to moderate lifting and exercise activities. Limit your activities as directed by your health care provider.  Do Kegel exercises as directed by your health care provider. To do this: ? Squeeze your pelvic floor muscles tight. You should feel a tight lift in your rectal area and a tightness in your vaginal area. Keep your stomach, buttocks, and legs relaxed. ? Hold the muscles tight for up to 10 seconds. ? Relax your muscles. ? Repeat this exercise 50 times a day,  or as many times as told by your health care provider. Continue to do this exercise for at least 4-6 weeks, or for as long as told by your health care provider.  Take over-the-counter and  prescription medicines only as told by your health care provider.  If you have a pessary, take care of it as told by your health care provider.  Keep all follow-up visits as told by your health care provider. This is important. Contact a health care provider if you:  Have symptoms that interfere with your daily activities or sex life.  Need medicine to help with the discomfort.  Notice bleeding from your vagina that is not related to your period.  Have a fever.  Have pain or bleeding when you urinate.  Have bleeding when you pass stool.  Pass urine when you have sex.  Have chronic constipation.  Have a pessary that falls out.  Have bad smelling vaginal discharge.  Have an unusual, low pain in your abdomen. Summary  Pelvic organ prolapse is the stretching, bulging, or dropping of pelvic organs into an abnormal position. It happens when the muscles and tissues that surround and support pelvic structures become weak or stretched.  When organs other than the vagina are involved, they often bulge into the vagina or protrude from the vagina, depending on how severe the prolapse is.  In most cases, this condition needs to be treated only if it produces symptoms. Treatment may include lifestyle changes, estrogen, Kegel exercises, pessary insertion, or surgery.  Avoid heavy lifting and straining with exercise and work. Do not hold your breath when you perform mild to moderate lifting and exercise activities. Limit your activities as directed by your health care provider. This information is not intended to replace advice given to you by your health care provider. Make sure you discuss any questions you have with your health care provider. Document Revised: 10/25/2017 Document Reviewed: 10/25/2017 Elsevier Patient Education  2020 [employer].

## 2020-08-31 DIAGNOSIS — E538 Deficiency of other specified B group vitamins: Secondary | ICD-10-CM | POA: Diagnosis not present

## 2020-09-01 NOTE — Progress Notes (Signed)
[facility] New Patient Evaluation and Consultation  Referring Provider: Sloan Leiter, MD PCP: Lujean Amel, MD Date of Service: 09/02/2020  SUBJECTIVE Chief Complaint: New Patient (Initial Visit) (DR.DAVIS REFERAL)  History of Present Illness: Mary Donovan is a 74 y.o. White or Caucasian female seen in consultation at the request of Dr. Rosana Hoes for evaluation of prolapse.    Review of records significant for: Has noticed prolapse for several years. No voiding or stooling issues.  She is s/p hysterectomy.   Urinary Symptoms: Does not leak urine.  Usually goes to bathroom frequently   Day time voids every hour in the morning, less later in day.  Nocturia: maybe 1 times per night to void. Voiding dysfunction: she empties her bladder well.  does not use a catheter to empty bladder.  When urinating, she feels she has no difficulties  UTIs: 0 UTI's in the last year.  Feels like she might have a UTI today- having some frequency.  Denies history of blood in urine and kidney or bladder stones  Pelvic Organ Prolapse Symptoms:                  She Admits to a feeling of a bulge the vaginal area. It has been present for several years.  She Admits to seeing a bulge.  This bulge is bothersome.  Bowel Symptom: Bowel movements: "constant diarrhea" takes imodium and watching diet.  Stool consistency: loose Straining: no.  Splinting: no.  Incomplete evacuation: no.  She Admits to accidental bowel leakage / fecal incontinence- irregular, usually able to control by what she eats.  Bowel regimen: imodium Last colonoscopy: Date several years ago, Results negative  Sexual Function Sexually active: no.   Pelvic Pain Denies pelvic pain   Past Medical History:  Past Medical History:  Diagnosis Date  . Arthritis   . Diabetes mellitus without complication ([place])   . Hypercholesterolemia   . Hypertension   . Skin cancer   . Vaginal Pap smear, abnormal 1978   hysterectomy   Last A1c 06/2020: [facility])   Past Surgical History:   Past Surgical History:  Procedure Laterality Date  . ABDOMINAL HYSTERECTOMY  1977  . BREAST REDUCTION SURGERY  1985  . Illectomy  02/1998  . left foot reconstruction  07/2011     Past OB/GYN History: G2 P2 Vaginal deliveries: 2,  Forceps/ Vacuum deliveries: 0, Cesarean section: 0 Menopausal: Yes, s/p hysterectomy in 1977 Contraception: n/a. Last pap smear was prior to hyst.  Any history of abnormal pap smears: no.   Medications: She has a current medication list which includes the following prescription(s): calcium carbonate, celecoxib, contour next test, furosemide, gabapentin, glimepiride, metformin, montelukast, simvastatin, valsartan, and [START ON 09/03/2020] estradiol.   Allergies: Patient is allergic to clindamycin, penicillin g, latex, and neosporin original [bacitracin-neomycin-polymyxin].   Social History:  Social History   Tobacco Use  . Smoking status: Never Smoker  . Smokeless tobacco: Never Used  Vaping Use  . Vaping Use: Never used  Substance Use Topics  . Alcohol use: Not Currently  . Drug use: Never    Relationship status: widowed She lives with son and daughter in [place]   She is not employed. Regular exercise: No History of abuse: No  Family History:   Family History  Problem Relation Age of Onset  . Cancer Mother   . Diabetes Other   . Cancer Paternal Aunt   . Breast cancer Paternal Aunt   . Cancer Daughter   .  Hypertension Neg Hx      Review of Systems: Review of Systems  Constitutional: Negative for fever, malaise/fatigue and weight loss.  Respiratory: Negative for cough, shortness of breath and wheezing.   Cardiovascular: Positive for leg swelling. Negative for chest pain and palpitations.  Gastrointestinal: Negative for abdominal pain and blood in stool.  Genitourinary: Negative for dysuria.  Musculoskeletal: Negative for myalgias.  Skin: Negative for rash.   Neurological: Negative for dizziness and headaches.  Endo/Heme/Allergies: Bruises/bleeds easily.  Psychiatric/Behavioral: Negative for depression. The patient is not nervous/anxious.      OBJECTIVE Physical Exam: Vitals:   09/02/20 1011  BP: (!) 166/82  Pulse: 75  Weight: 167 lb (75.8 kg)    Physical Exam Constitutional:      General: She is not in acute distress. Pulmonary:     Effort: Pulmonary effort is normal.  Abdominal:     General: There is no distension.     Palpations: Abdomen is soft.     Tenderness: There is no abdominal tenderness. There is no rebound.  Musculoskeletal:        General: No swelling. Normal range of motion.  Skin:    General: Skin is warm and dry.     Findings: No rash.  Neurological:     Mental Status: She is alert and oriented to person, place, and time.  Psychiatric:        Mood and Affect: Mood normal.        Behavior: Behavior normal.      GU / Detailed Urogynecologic Evaluation:  Pelvic Exam: Normal external female genitalia; Bartholin's and Skene's glands normal in appearance; urethral meatus normal in appearance, no urethral masses or discharge.   CST: negative    s/p hysterectomy: Speculum exam reveals normal vaginal mucosa with  atrophy and normal vaginal cuff.  Adnexa no mass, fullness, tenderness.     Pelvic floor strength II/V  Pelvic floor musculature: Right levator non-tender, Right obturator non-tender, Left levator non-tender, Left obturator non-tender  POP-Q:   POP-Q  3                                            Aa   3                                           Ba  -4                                              C   6.5                                            Gh  4                                            Pb  8  tvl   -2                                            Ap  -2                                            Bp   (n/a)                                               D     Rectal Exam:  Normal external rectum  Post-Void Residual (PVR) by Bladder Scan: In order to evaluate bladder emptying, we discussed obtaining a postvoid residual and she agreed to this procedure.  Procedure: The ultrasound unit was placed on the patient's abdomen in the suprapubic region after the patient had voided. A PVR of 53 ml was obtained by bladder scan.  Laboratory Results: POC urine: + urobilinogen  I visualized the urine specimen, noting the specimen to be clear yellow  ASSESSMENT AND PLAN [name] is a 74 y.o. with:  1. Prolapse of anterior vaginal wall   2. Vaginal vault prolapse after hysterectomy   3. Vaginal atrophy   4. Urinary frequency    1. Stage III anterior, Stage I posterior, Stage I apical prolapse For treatment of pelvic organ prolapse, we discussed options for management including expectant management, conservative management, and surgical management, such as Kegels, a pessary, pelvic floor physical therapy, and specific surgical procedures (SSLF or SCP). - She is interested in starting with a pessary. She will return for a fitting.   2. Vaginal atrophy For symptomatic vaginal atrophy options include lubrication with a water-based lubricant, personal hygiene measures and barrier protection against wetness, and estrogen replacement in the form of vaginal cream, vaginal tablets, or a time-released vaginal ring.   - Rx provided for estrace cream 0.5g nightly for two weeks then twice a week after. She will also use coconut oil in between for vaginal moisture.   3. Urinary frequency - POC urine shows no sign of infection today.  - Empties bladder well, PVR normal.   [name], MD   Medical Decision Making:  - Reviewed/ ordered a clinical laboratory test - Review and summation of prior records - Independent review of urine specimen

## 2020-09-02 ENCOUNTER — Other Ambulatory Visit: Payer: Self-pay

## 2020-09-02 ENCOUNTER — Ambulatory Visit (INDEPENDENT_AMBULATORY_CARE_PROVIDER_SITE_OTHER): Payer: Medicare Other | Admitting: Obstetrics and Gynecology

## 2020-09-02 ENCOUNTER — Encounter: Payer: Self-pay | Admitting: Obstetrics and Gynecology

## 2020-09-02 VITALS — BP 166/82 | HR 75 | Wt 167.0 lb

## 2020-09-02 DIAGNOSIS — N952 Postmenopausal atrophic vaginitis: Secondary | ICD-10-CM

## 2020-09-02 DIAGNOSIS — N8110 Cystocele, unspecified: Secondary | ICD-10-CM | POA: Diagnosis not present

## 2020-09-02 DIAGNOSIS — N993 Prolapse of vaginal vault after hysterectomy: Secondary | ICD-10-CM | POA: Diagnosis not present

## 2020-09-02 DIAGNOSIS — R3915 Urgency of urination: Secondary | ICD-10-CM

## 2020-09-02 DIAGNOSIS — R350 Frequency of micturition: Secondary | ICD-10-CM | POA: Diagnosis not present

## 2020-09-02 LAB — POCT URINALYSIS DIPSTICK
Bilirubin, UA: NEGATIVE
Blood, UA: NEGATIVE
Glucose, UA: NEGATIVE
Ketones, UA: NEGATIVE
Leukocytes, UA: NEGATIVE
Nitrite, UA: NEGATIVE
Protein, UA: NEGATIVE
Spec Grav, UA: 1.015 (ref 1.010–1.025)
Urobilinogen, UA: 0.2 E.U./dL
pH, UA: 7 (ref 5.0–8.0)

## 2020-09-02 MED ORDER — ESTRADIOL 0.1 MG/GM VA CREA: 0.5000 g | TOPICAL_CREAM | VAGINAL | 11 refills | Status: DC

## 2020-09-02 NOTE — Patient Instructions (Addendum)
You have a stage 3 (out of 4) prolapse.  We discussed the fact that it is not life threatening but there are several treatment options. For treatment of pelvic organ prolapse, we discussed options for management including expectant management, conservative management, and surgical management, such as Kegels, a pessary, pelvic floor physical therapy, and specific surgical procedures.    You have decided to try the pessary. We will call you to come in for a pessary fitting.   Start vaginal estrogen cream 0.5g nightly for two weeks then twice a week after.   Vulvovaginal moisturizer Options: Marland Kitchen Vitamin E oil (pump or capsule) or cream (Gene's Vit E Cream) . Coconut oil . Silicone-based lubricant for use during intercourse ("wet platinum" is a brand available at most drugstores) . Crisco Consider the ingredients of the product - the fewer the ingredients the better!  Directions for Use: Clean and dry your hands Gently dab the vulvar/vaginal area dry as needed Apply a "pea-sized" amount of the moisturizer onto your fingertip Using you other hand, open the labia  Apply the moisturizer to the vulvar/vaginal tissues Wear loose fitting underwear/clothing if possible following application Use moisturize up to 3 times daily as desired.

## 2020-09-25 ENCOUNTER — Encounter: Payer: Self-pay | Admitting: Obstetrics and Gynecology

## 2020-09-30 DIAGNOSIS — E538 Deficiency of other specified B group vitamins: Secondary | ICD-10-CM | POA: Diagnosis not present

## 2020-10-01 NOTE — Progress Notes (Signed)
Urogynecology   Subjective:     Chief Complaint: Follow-up and Pessary placement  History of Present Illness: Mary Donovan is a 74 y.o. female with stage III pelvic organ prolapse who presents today for a pessary fitting.   She would like to proceed with pessary today for her prolapse symptoms. She started using the vaginal estrogen cream.    Past Medical History: Patient  has a past medical history of Arthritis, Crohn disease ([place]), Diabetes mellitus without complication ([place]), Hypercholesterolemia, Hypertension, Skin cancer, and Vaginal Pap smear, abnormal (1978).   Past Surgical History: She  has a past surgical history that includes Abdominal hysterectomy (1977); Breast reduction surgery (1985); Illectomy (02/1998); and left foot reconstruction (07/2011).   Medications: She has a current medication list which includes the following prescription(s): calcium carbonate, celecoxib, contour next test, estradiol, furosemide, gabapentin, glimepiride, metformin, montelukast, simvastatin, and valsartan.   Allergies: Patient is allergic to clindamycin, penicillin g, latex, and neosporin original [bacitracin-neomycin-polymyxin].   Social History: Patient  reports that she has never smoked. She has never used smokeless tobacco. She reports previous alcohol use. She reports that she does not use drugs.      Objective:    BP (!) 159/84   Pulse 80   Ht 5' 3"  (1.6 m)   Wt 167 lb (75.8 kg)   BMI 29.58 kg/m  Gen: No apparent distress, A&O x 3. Pelvic Exam: Normal external female genitalia; Bartholin's and Skene's glands normal in appearance; urethral meatus normal in appearance, no urethral masses or discharge.  Pessary fitting:   A size 3" ring with support was placed. This was too large and was bulging out. A 2-3/4in ring with support was placed. This was comfortable but fell out with bending over. A 2-1/2" short stem gellhorn pessary was fitted, Lot #[id number], exp 09/01/25.  It was comfortable, stayed in place with valsalva and was an appropriate size on examination, with one finger fitting between the pessary and the vaginal walls. This stayed in place with walking and bending.    Prior exam showed:   POP-Q  3                                            Aa   3                                           Ba  -4                                              C   6.5                                            Gh  4                                            Pb  8  tvl   -2                                            Ap  -2                                            Bp   (n/a)                                              D      Assessment/Plan:    Assessment: [name] is a 74 y.o. with stage III pelvic organ prolapse who presents for a pessary fitting.   Plan: She has a 2-1/2in ss gellhorn pessary. She will keep the pessary in place until next visit. She will use estrogen twice a week. She will follow-up in 1 month for a pessary check or sooner as needed.  All questions were answered.     [name], MD

## 2020-10-06 ENCOUNTER — Other Ambulatory Visit: Payer: Self-pay

## 2020-10-06 ENCOUNTER — Ambulatory Visit (INDEPENDENT_AMBULATORY_CARE_PROVIDER_SITE_OTHER): Payer: Medicare Other | Admitting: Obstetrics and Gynecology

## 2020-10-06 ENCOUNTER — Encounter: Payer: Self-pay | Admitting: Obstetrics and Gynecology

## 2020-10-06 VITALS — BP 159/84 | HR 80 | Ht 63.0 in | Wt 167.0 lb

## 2020-10-06 DIAGNOSIS — N993 Prolapse of vaginal vault after hysterectomy: Secondary | ICD-10-CM

## 2020-10-06 DIAGNOSIS — N8110 Cystocele, unspecified: Secondary | ICD-10-CM

## 2020-10-06 NOTE — Patient Instructions (Signed)
You were fit with a gellhorn pessary. You are electing to leave it in place until your follow up visit. If it falls out you can try to put it back in (with the knob facing outwards) or you can leave it out. If it seems to be slipping down you can use your finger to push it back in deeper - the deeper it is, the better fit.

## 2020-10-23 ENCOUNTER — Ambulatory Visit (INDEPENDENT_AMBULATORY_CARE_PROVIDER_SITE_OTHER): Payer: Medicare Other | Admitting: Obstetrics and Gynecology

## 2020-10-23 ENCOUNTER — Encounter: Payer: Self-pay | Admitting: Obstetrics and Gynecology

## 2020-10-23 ENCOUNTER — Other Ambulatory Visit: Payer: Self-pay

## 2020-10-23 VITALS — BP 158/83 | HR 97 | Ht 63.0 in | Wt 164.0 lb

## 2020-10-23 DIAGNOSIS — N8110 Cystocele, unspecified: Secondary | ICD-10-CM | POA: Diagnosis not present

## 2020-10-23 DIAGNOSIS — N993 Prolapse of vaginal vault after hysterectomy: Secondary | ICD-10-CM | POA: Diagnosis not present

## 2020-10-23 NOTE — Patient Instructions (Signed)
You were fit with a 2 in cube pessary. You are electing to leave it in place until your follow up visit. If it falls out you can try to put it back in or you can leave it out. If it seems to be slipping down you can use your finger to push it back in deeper - the deeper it is, the better fit.

## 2020-10-23 NOTE — Progress Notes (Addendum)
Mary Donovan   Subjective:     Chief Complaint: Pessary Follow up  History of Present Illness: Mary Donovan is a 75 y.o. female with stage III pelvic organ prolapse who presents today for a pessary check.  She was previously fit with a 2-1/2in ss gellhorn pessary.  This has been intermittently falling out at home. She has been replacing it herself as needed. Has also felt bulging around it. She is using vaginal estrogen twice a week. Denies vaginal bleeding or discharge.   Mary Donovan:  3in ring with support 2 3/4 in ring with support     Past Medical History: Patient  has a past medical history of Arthritis, Crohn disease (Mary Donovan), Diabetes mellitus without complication (Mary Donovan), Hypercholesterolemia, Hypertension, Skin cancer, and Vaginal Pap smear, abnormal (1978).   Past Surgical History: She  has a past surgical history that includes Abdominal hysterectomy (1977); Breast reduction surgery (1985); Illectomy (02/1998); and left foot reconstruction (07/2011).   Medications: She has a current medication list which includes the following prescription(s): calcium carbonate, celecoxib, contour next test, estradiol, furosemide, gabapentin, glimepiride, metformin, montelukast, simvastatin, and valsartan.   Allergies: Patient is allergic to clindamycin, penicillin g, latex, and neosporin original [bacitracin-neomycin-polymyxin].   Social History: Patient  reports that she has never smoked. She has never used smokeless tobacco. She reports previous alcohol use. She reports that she does not use drugs.      Objective:    BP (!) 158/83   Pulse 97   Ht 5' 3"  (1.6 m)   Wt 164 lb (74.4 kg)   BMI 29.05 kg/m  Gen: No apparent distress, A&O x 3. Pelvic Exam: Normal external female genitalia; Bartholin's and Skene's glands normal in appearance; urethral meatus normal in appearance, no urethral masses or discharge.   A size 3in short stem gellhorn pessary was fitted. Upon  sitting, it felt uncomfortable to her so it was removed. A size 2in cube was placed, Lot# [id number], Exp 06/12/2025.  It was comfortable, stayed in place with valsalva and was an appropriate size on examination, with one finger fitting between the pessary and the vaginal walls. Stayed in place with walking and bending.   Prior exam showed:   POP-Q  3 Aa  3 Ba  -4 C   6.5 Gh  4 Pb  8 tvl   -2 Ap  -2 Bp  (n/a) D       Assessment/Plan:    Assessment: Mary Donovan is a 75 y.o. with stage III pelvic organ prolapse who presents for a pessary fitting. Plan:  - Fitted today with a 2in cube pessary She will keep the pessary in place until next visit. She will use vaginal estrogen and coconut oil in between.   She will follow-up on 11/03/20 for a pessary check or sooner as needed.   Mary Folds, MD

## 2020-10-24 ENCOUNTER — Emergency Department (HOSPITAL_COMMUNITY)
Admission: EM | Admit: 2020-10-24 | Discharge: 2020-10-24 | Disposition: A | Discharge status: Home / Self Care | Payer: Medicare Other | Attending: Emergency Medicine | Admitting: Emergency Medicine

## 2020-10-24 ENCOUNTER — Encounter (HOSPITAL_COMMUNITY): Payer: Self-pay

## 2020-10-24 ENCOUNTER — Other Ambulatory Visit: Payer: Self-pay

## 2020-10-24 DIAGNOSIS — Z79899 Other long term (current) drug therapy: Secondary | ICD-10-CM | POA: Insufficient documentation

## 2020-10-24 DIAGNOSIS — Z85828 Personal history of other malignant neoplasm of skin: Secondary | ICD-10-CM | POA: Insufficient documentation

## 2020-10-24 DIAGNOSIS — Z7984 Long term (current) use of oral hypoglycemic drugs: Secondary | ICD-10-CM | POA: Diagnosis not present

## 2020-10-24 DIAGNOSIS — T8389XA Other specified complication of genitourinary prosthetic devices, implants and grafts, initial encounter: Secondary | ICD-10-CM | POA: Diagnosis not present

## 2020-10-24 DIAGNOSIS — R350 Frequency of micturition: Secondary | ICD-10-CM | POA: Insufficient documentation

## 2020-10-24 DIAGNOSIS — E119 Type 2 diabetes mellitus without complications: Secondary | ICD-10-CM | POA: Diagnosis not present

## 2020-10-24 DIAGNOSIS — Z91040 Latex allergy status: Secondary | ICD-10-CM | POA: Diagnosis not present

## 2020-10-24 DIAGNOSIS — T83711A Erosion of implanted vaginal mesh and other prosthetic materials to surrounding organ or tissue, initial encounter: Secondary | ICD-10-CM | POA: Diagnosis not present

## 2020-10-24 DIAGNOSIS — I10 Essential (primary) hypertension: Secondary | ICD-10-CM | POA: Insufficient documentation

## 2020-10-24 DIAGNOSIS — T839XXA Unspecified complication of genitourinary prosthetic device, implant and graft, initial encounter: Secondary | ICD-10-CM

## 2020-10-24 DIAGNOSIS — N898 Other specified noninflammatory disorders of vagina: Secondary | ICD-10-CM | POA: Diagnosis present

## 2020-10-24 LAB — URINALYSIS, ROUTINE W REFLEX MICROSCOPIC
Bilirubin Urine: NEGATIVE
Glucose, UA: NEGATIVE mg/dL
Ketones, ur: 5 mg/dL — AB
Nitrite: NEGATIVE
Protein, ur: 100 mg/dL — AB
Specific Gravity, Urine: 1.018 (ref 1.005–1.030)
pH: 5 (ref 5.0–8.0)

## 2020-10-24 MED ORDER — CEPHALEXIN 500 MG PO CAPS: 500.0000 mg | ORAL_CAPSULE | Freq: Two times a day (BID) | ORAL | 0 refills | Status: AC

## 2020-10-24 NOTE — Discharge Instructions (Signed)
We did take your pessary out today, I want you to follow-up with your urologist.  In regards to your UTI, this most likely due to the pessary, I did prescribe you antibiotics for this, if your urine culture ends up being clean as we discussed he can stop taking antibiotics.  As we discussed you are allergic to penicillin which is related to Keflex, he said you have taken Keflex in the past without any reaction, if you do start having any reaction to this play stop taking this and come back to the emergency department.  If you have any new or worsening concerning symptoms please come back to the emergency department. Get help right away if: You have very bad back pain. You have very bad pain in your lower belly. You have a fever. You are sick to your stomach (nauseous). You are throwing up.

## 2020-10-24 NOTE — ED Triage Notes (Signed)
Pt states she had new pessary placed yesterday, now burning and unable to urinate since yesterday morning.

## 2020-10-24 NOTE — ED Notes (Signed)
UA sample collected and sent

## 2020-10-24 NOTE — ED Provider Notes (Signed)
Mary Donovan   CSN: [id number] Arrival date & time: 10/24/20  1403     History No chief complaint on file.   Mary Donovan is a 75 y.o. female with pertinent past medical history of Crohn's, diabetes, hypercholesteremia, hypertension with stage III pelvic organ prolapse with pessary that presents to the emergency department today for a pessary problem.  Patient states that Mary Donovan was seen yesterday by Dr. Wannetta Sender, urogyn for a pessary, was fitted with a 2 inch cube pessary.  Plan was to keep the pessary in for 10 days until follow-up.  Patient states that this morning at 330 Mary Donovan started having dysuria, frequency.  States that Mary Donovan is getting up to go to the bathroom every 20 minutes because Mary Donovan feels as if Mary Donovan needs to empty her bladder, however only drips will dribble out.  States that Mary Donovan wants her pessary out.  States that this only started occurring after Mary Donovan got her pessary in.  Denies any hematuria.  Denies any back pain, new abdominal pain, nausea, vomiting, fevers, back pain.  States that Mary Donovan was in her normal health before this.  States that Mary Donovan never wants a pessary again.  Patient states that Mary Donovan tried removing pessary multiple times, however it is a suction fitting pessary  HPI     Past Medical History:  Diagnosis Date  . Arthritis   . Crohn disease ([place])   . Diabetes mellitus without complication ([place])   . Hypercholesterolemia   . Hypertension   . Skin cancer   . Vaginal Pap smear, abnormal 1978   hysterectomy    Patient Active Problem List   Diagnosis Date Noted  . Prolapse of anterior vaginal wall 09/02/2020  . Vaginal atrophy 09/02/2020    Past Surgical History:  Procedure Laterality Date  . ABDOMINAL HYSTERECTOMY  1977  . BREAST REDUCTION SURGERY  1985  . Illectomy  02/1998  . left foot reconstruction  07/2011     OB History    Gravida  2   Para  2   Term  2   Preterm  0   AB  0   Living  2     SAB   0   IAB  0   Ectopic  0   Multiple  0   Live Births  2        Obstetric Comments  SON 11 POUNDS DAUGHTER 8 POUNDS        Family History  Problem Relation Age of Onset  . Cancer Mother   . Diabetes Other   . Cancer Paternal Aunt   . Breast cancer Paternal Aunt   . Cancer Daughter   . Hypertension Neg Hx     Social History   Tobacco Use  . Smoking status: Never Smoker  . Smokeless tobacco: Never Used  Vaping Use  . Vaping Use: Never used  Substance Use Topics  . Alcohol use: Not Currently  . Drug use: Never    Home Medications Prior to Admission medications   Medication Sig Start Date End Date Taking? Authorizing Provider  cephALEXin (KEFLEX) 500 MG capsule Take 1 capsule (500 mg total) by mouth 2 (two) times daily for 7 days. 10/24/20 10/31/20 Yes Saleha Kalp, PA-C  calcium carbonate 100 mg/ml SUSP Take by mouth.    [provider]  celecoxib (CELEBREX) 200 MG capsule TAKE 1 CAPSULE EVERY DAY WITH FOOD 08/22/17   [provider]  CONTOUR NEXT TEST test strip SMARTSIG:Via  Meter 07/20/20   [provider]  estradiol (ESTRACE) 0.1 MG/GM vaginal cream Place 0.5 g vaginally 2 (two) times a week. Place 0.5g nightly for two weeks then twice a week after 09/03/20   [name], MD  furosemide (LASIX) 20 MG tablet Take 20 mg by mouth daily. 06/12/20   [provider]  gabapentin (NEURONTIN) 300 MG capsule Take 300 mg by mouth daily. 05/20/20   [provider]  glimepiride (AMARYL) 1 MG tablet Take 1 mg by mouth daily. 06/15/20   [provider]  metFORMIN (GLUCOPHAGE) 500 MG tablet Take 1,000 mg by mouth 2 (two) times daily. 06/22/20   [provider]  montelukast (SINGULAIR) 10 MG tablet Take 10 mg by mouth daily. 07/20/20   [provider]  simvastatin (ZOCOR) 20 MG tablet Take 20 mg by mouth at bedtime. 07/15/20   [provider]  valsartan (DIOVAN) 80 MG tablet Take 80 mg by mouth daily.  07/28/20   [provider]    Allergies    Clindamycin, Penicillin g, Latex, and Neosporin original [bacitracin-neomycin-polymyxin]  Review of Systems   Review of Systems  Constitutional: Negative for chills, diaphoresis, fatigue and fever.  HENT: Negative for congestion, sore throat and trouble swallowing.   Eyes: Negative for pain and visual disturbance.  Respiratory: Negative for cough, shortness of breath and wheezing.   Cardiovascular: Negative for chest pain, palpitations and leg swelling.  Gastrointestinal: Negative for abdominal distention, abdominal pain, diarrhea, nausea and vomiting.  Genitourinary: Positive for difficulty urinating, dysuria, enuresis and frequency. Negative for decreased urine volume, flank pain, genital sores, hematuria, menstrual problem, pelvic pain, urgency, vaginal bleeding, vaginal discharge and vaginal pain.  Musculoskeletal: Negative for back pain, neck pain and neck stiffness.  Skin: Negative for pallor.  Neurological: Negative for dizziness, speech difficulty, weakness and headaches.  Psychiatric/Behavioral: Negative for confusion.    Physical Exam Updated Vital Signs BP 131/76 (BP Location: Left Arm)   Pulse 81   Temp 97.9 F (36.6 C) (Oral)   Resp 20   SpO2 96%   Physical Exam Constitutional:      General: Mary Donovan is not in acute distress.    Appearance: Normal appearance. Mary Donovan is not ill-appearing, toxic-appearing or diaphoretic.  HENT:     Mouth/Throat:     Mouth: Mucous membranes are moist.     Pharynx: Oropharynx is clear.  Eyes:     General: No scleral icterus.    Extraocular Movements: Extraocular movements intact.     Pupils: Pupils are equal, round, and reactive to light.  Cardiovascular:     Rate and Rhythm: Normal rate and regular rhythm.     Pulses: Normal pulses.     Heart sounds: Normal heart sounds.  Pulmonary:     Effort: Pulmonary effort is normal. No respiratory distress.     Breath sounds: Normal breath  sounds. No stridor. No wheezing, rhonchi or rales.  Chest:     Chest wall: No tenderness.  Abdominal:     General: Abdomen is flat. There is no distension.     Palpations: Abdomen is soft.     Tenderness: There is no abdominal tenderness. There is no right CVA tenderness, left CVA tenderness, guarding or rebound.  Genitourinary:    Comments: Chaperone present.  Pessary in place.  Was able to remove pessary with forceps, after this normal GU exam.  Unable to see cervix due to vaginal wall collapse.  No tenderness to complex. Musculoskeletal:  General: No swelling or tenderness. Normal range of motion.     Cervical back: Normal range of motion and neck supple. No rigidity.     Right lower leg: No edema.     Left lower leg: No edema.  Skin:    General: Skin is warm and dry.     Capillary Refill: Capillary refill takes less than 2 seconds.     Coloration: Skin is not pale.  Neurological:     General: No focal deficit present.     Mental Status: Mary Donovan is alert and oriented to person, place, and time.  Psychiatric:        Mood and Affect: Mood normal.        Behavior: Behavior normal.     ED Results / Procedures / Treatments   Labs (all labs ordered are listed, but only abnormal results are displayed) Labs Reviewed  URINALYSIS, ROUTINE W REFLEX MICROSCOPIC - Abnormal; Notable for the following components:      Result Value   APPearance HAZY (*)    Hgb urine dipstick SMALL (*)    Ketones, ur 5 (*)    Protein, ur 100 (*)    Leukocytes,Ua SMALL (*)    Bacteria, UA RARE (*)    All other components within normal limits  URINE CULTURE    EKG None  Radiology No results found.  Procedures .Foreign Body Removal  Date/Time: 10/24/2020 10:27 PM Performed by: Alfredia Client, PA-C Authorized by: Alfredia Client, PA-C  Consent: Verbal consent obtained. Risks and benefits: risks, benefits and alternatives were discussed Consent given by: patient Patient understanding: patient  states understanding of the procedure being performed Time out: Immediately prior to procedure a "time out" was called to verify the correct patient, procedure, equipment, support staff and site/side marked as required. Body area: vagina  Sedation: Patient sedated: no  Patient restrained: no Patient cooperative: yes Localization method: visualized Removal mechanism: forceps Complexity: simple 1 objects recovered. Objects recovered: pessary  Post-procedure assessment: foreign body removed Patient tolerance: patient tolerated the procedure well with no immediate complications   (including critical care time)  Medications Ordered in ED Medications - No data to display  ED Course  I have reviewed the triage vital signs and the nursing notes.  Pertinent labs & imaging results that were available during my care of the patient were reviewed by me and considered in my medical decision making (see chart for details).    MDM Rules/Calculators/A&P                          SNIGDHA HOWSER is a 75 y.o. female with pertinent past medical history of Crohn's, diabetes, hypercholesteremia, hypertension with stage III pelvic organ prolapse with pessary that presents to the emergency department today for a pessary problem.  Patient states that Mary Donovan came to the emergency department today to get her pessary removed, states that Mary Donovan got it placed yesterday.  Is having UTI symptoms, urine does appear slightly infected.  Patient afebrile, nontachycardic, no concerns for pyelonephritis or sepsis.  Was able to remove pessary, no complications.  Normal GU exam.  Patient states that Mary Donovan feels much better, was observed for an hour after this, states that Mary Donovan is able to urinate much better now, states that Mary Donovan feels much better.  Ready for discharge.  Patient will follow-up with urologist.  Will provide patient antibiotics for questionable UTI, patient will follow-up with urine culture.  Patient be discharged.   Patient  states that Mary Donovan has taken Keflex in the past without any problems.  Doubt need for further emergent work up at this time. I explained the diagnosis and have given explicit precautions to return to the ER including for any other new or worsening symptoms. The patient understands and accepts the medical plan as it's been dictated and I have answered their questions. Discharge instructions concerning home care and prescriptions have been given. The patient is STABLE and is discharged to home in good condition.  I discussed this case with my attending physician who cosigned this Donovan including patient's presenting symptoms, physical exam, and planned diagnostics and interventions. Attending physician stated agreement with plan or made changes to plan which were implemented.   Final Clinical Impression(s) / ED Diagnoses Final diagnoses:  Problem with vaginal pessary, initial encounter [facility])    Rx / DC Orders ED Discharge Orders         Ordered    cephALEXin (KEFLEX) 500 MG capsule  2 times daily        10/24/20 2217           Alfredia Client, PA-C 10/24/20 2229    Sherwood Gambler, MD 10/26/20 [phone]

## 2020-10-24 NOTE — ED Notes (Signed)
Initial contact with pt. Provider at bedside.

## 2020-10-26 ENCOUNTER — Telehealth: Payer: Self-pay | Admitting: Obstetrics and Gynecology

## 2020-10-26 ENCOUNTER — Encounter: Payer: Self-pay | Admitting: Obstetrics and Gynecology

## 2020-10-26 LAB — URINE CULTURE

## 2020-10-26 NOTE — Telephone Encounter (Signed)
Called patient about follow up for ER visit. She reports that she could not urinate for almost 24 hours. Only had a dribble of urine. Pessary was removed and she was able to urinate fine.   She is currently taking cephalexin for possible UTI- discussed that urine culture did not show definitive infection and she can stop the antibiotic.   She has an appointment scheduled on 11/03/20 and we can discuss alternative options at that time.   [name], MD

## 2020-10-28 DIAGNOSIS — Z23 Encounter for immunization: Secondary | ICD-10-CM | POA: Diagnosis not present

## 2020-10-29 NOTE — Progress Notes (Deleted)
[facility] Return Visit  SUBJECTIVE  History of Present Illness: Mary Donovan is a 75 y.o. female seen in follow-up for prolapse. At last visit, pessary was changed to a 2in cube. She ended up going to the emergency room the next day for inability to urinate and pessary was removed. She no longer wants to keep the pessary.   Other pessaries tried:  3in ss gellhorn 3in ring with support 2- 3/4in ring with support 2- 1/2 in ss gellhorn  Past Medical History: Patient  has a past medical history of Arthritis, Crohn disease ([place]), Diabetes mellitus without complication ([place]), Hypercholesterolemia, Hypertension, Skin cancer, and Vaginal Pap smear, abnormal (1978).   Past Surgical History: She  has a past surgical history that includes Abdominal hysterectomy (1977); Breast reduction surgery (1985); Illectomy (02/1998); and left foot reconstruction (07/2011).   Medications: She has a current medication list which includes the following prescription(s): calcium carbonate, celecoxib, cephalexin, contour next test, estradiol, furosemide, gabapentin, glimepiride, metformin, montelukast, simvastatin, and valsartan.   Allergies: Patient is allergic to clindamycin, penicillin g, latex, and neosporin original [bacitracin-neomycin-polymyxin].   Social History: Patient  reports that she has never smoked. She has never used smokeless tobacco. She reports previous alcohol use. She reports that she does not use drugs.      OBJECTIVE     Physical Exam: There were no vitals filed for this visit. Gen: No apparent distress, A&O x 3.  Detailed Urogynecologic Evaluation:  Deferred. Prior exam showed (09/02/20):  POP-Q  3 Aa  3 Ba  -4 C   6.5 Gh  4 Pb   8 tvl   -2 Ap  -2 Bp  (n/a) D        ASSESSMENT AND PLAN    Mary Donovan is a 75 y.o. with:  No diagnosis found.

## 2020-10-30 DIAGNOSIS — E538 Deficiency of other specified B group vitamins: Secondary | ICD-10-CM | POA: Diagnosis not present

## 2020-11-02 ENCOUNTER — Ambulatory Visit: Payer: Medicare Other | Admitting: Obstetrics and Gynecology

## 2020-11-03 ENCOUNTER — Ambulatory Visit: Payer: Medicare Other | Admitting: Obstetrics and Gynecology

## 2020-11-03 NOTE — Progress Notes (Signed)
[facility] Return Visit  SUBJECTIVE  History of Present Illness: Mary Donovan is a 75 y.o. female seen in follow-up for prolapse. At last visit she was fit with a 2in cube pessary. The next day she went to the ED for urinary retention and had the pessary removed. She is voiding normally now. Now interested in surgical options for prolapse repair.   Pessaries tried: 3in ring with support 2 3/4 in ring with support  2-1/3 in ss gellhorn 3 in ss gellhorn   Past Medical History: Patient  has a past medical history of Arthritis, Crohn disease ([place]), Diabetes mellitus without complication ([place]), Hypercholesterolemia, Hypertension, Skin cancer, and Vaginal Pap smear, abnormal (1978).   Past Surgical History: She  has a past surgical history that includes Breast reduction surgery (1985); Illectomy (02/1998); left foot reconstruction (07/2011); and Vaginal hysterectomy.   Medications: She has a current medication list which includes the following prescription(s): calcium carbonate, celecoxib, contour next test, estradiol, furosemide, gabapentin, glimepiride, metformin, montelukast, simvastatin, and valsartan.   Allergies: Patient is allergic to clindamycin, penicillin g, latex, and neosporin original [bacitracin-neomycin-polymyxin].   Social History: Patient  reports that she has never smoked. She has never used smokeless tobacco. She reports previous alcohol use. She reports that she does not use drugs.      OBJECTIVE     Physical Exam: Vitals:   11/04/20 1331  BP: (!) 153/82  Pulse: 82  Weight: 164 lb (74.4 kg)  Height: 5\' 2"  (1.575 m)   Gen: No apparent distress, A&O x 3.  Detailed Urogynecologic Evaluation:  Deferred. Prior exam showed:  Prior exam showed (09/02/20):  POP-Q  3 Aa  3 Ba  -4 C    6.5 Gh  4 Pb  8 tvl   -2 Ap  -2 Bp  (n/a) D       ASSESSMENT AND PLAN    Mary Donovan is a 75 y.o. with:  1. Prolapse of anterior vaginal wall   2. Vaginal vault prolapse after hysterectomy    1. Stage III anterior, Stage I posterior, Stage I apical prolapse - We reviewed the patient's specific anatomic and functional findings, with the assistance of diagrams and handouts.  We reviewed the treatment options including expectant management, conservative management, medical management, and surgical management.  We reviewed the benefits and risks of each treatment option. All her questions were answered and she verbalized understanding.   - she will need simple CMG to test for occult incontinence prior to surgery.  - Tentative plan for surgery is: Anterior repair, sacrospinous ligament fixation, possible perineorrhaphy.  - She will need medical clearance from her PCP- letter sent today from Dr Dorthy Cooler at [place]. Will need repeat A1c as she believes her last one was in September.  Will return for simple CMG procedure.   [name], MD   Time spent: I spent 30 minutes dedicated to the care of this patient on the date of this encounter to include pre-visit review of records, face-to-face time with the patient discussing surgical options and post visit documentation, and communication with her PCP.

## 2020-11-04 ENCOUNTER — Encounter: Payer: Self-pay | Admitting: Obstetrics and Gynecology

## 2020-11-04 ENCOUNTER — Other Ambulatory Visit: Payer: Self-pay

## 2020-11-04 ENCOUNTER — Ambulatory Visit (INDEPENDENT_AMBULATORY_CARE_PROVIDER_SITE_OTHER): Payer: Medicare Other | Admitting: Obstetrics and Gynecology

## 2020-11-04 VITALS — BP 153/82 | HR 82 | Ht 62.0 in | Wt 164.0 lb

## 2020-11-04 DIAGNOSIS — N993 Prolapse of vaginal vault after hysterectomy: Secondary | ICD-10-CM | POA: Diagnosis not present

## 2020-11-04 DIAGNOSIS — N8110 Cystocele, unspecified: Secondary | ICD-10-CM | POA: Diagnosis not present

## 2020-11-04 NOTE — Patient Instructions (Signed)
You are scheduled for a test to see if you will leak urine after surgery. This involves placing a catheter and filling your bladder with water. After this test, we can finalize your plan for surgery.

## 2020-11-06 NOTE — Progress Notes (Signed)
[facility] Return Visit  SUBJECTIVE  History of Present Illness: Mary Donovan is a 75 y.o. female seen in follow-up for prolapse. Plan at last visit was perform simple CMG for occult incontinence and discuss final plan for surgery.   She has had no changes in her symptoms since the last visit.   Past Medical History: Patient  has a past medical history of Arthritis, Crohn disease ([place]), Diabetes mellitus without complication ([place]), Hypercholesterolemia, Hypertension, Skin cancer, and Vaginal Pap smear, abnormal (1978).   Past Surgical History: She  has a past surgical history that includes Breast reduction surgery (1985); Illectomy (02/1998); left foot reconstruction (07/2011); and Vaginal hysterectomy.   Medications: She has a current medication list which includes the following prescription(s): calcium carbonate, celecoxib, contour next test, estradiol, furosemide, gabapentin, glimepiride, metformin, montelukast, simvastatin, and valsartan.   Allergies: Patient is allergic to clindamycin, penicillin g, latex, and neosporin original [bacitracin-neomycin-polymyxin].   Social History: Patient  reports that she has never smoked. She has never used smokeless tobacco. She reports previous alcohol use. She reports that she does not use drugs.      OBJECTIVE     Physical Exam: Vitals:   11/11/20 1108  BP: (!) 160/86  Pulse: 94  Weight: 164 lb (74.4 kg)  Height: 5' 2"  (1.575 m)   Gen: No apparent distress, A&O x 3.  Detailed Urogynecologic Evaluation:  Deferred.   Prior exam showed (09/02/20):  POP-Q  3 Aa  3 Ba  -4 C   6.5 Gh  4 Pb  8 tvl    -2 Ap  -2 Bp  (n/a) D       Verbal consent was obtained to perform simple CMG procedure:   Prolapse was reduced using 3 large cotton swabs. Urethra was prepped with betadine and a 73F catheter was placed and bladder was drained completely. The bladder was then backfilled with sterile water by gravity.  First sensation: 35m First Desire: 1373m Strong Desire: 19042mapacity: 425m49m was noted throughout filling.  With prolapse reduced, Cough stress test was positive only in the standing position. Valsalva stress test was negative.  She was was allowed to void on her own. Voided 75ml34mR with straight cath 350ml.79mnterpretation: CMG showed normal sensation, and normal cystometric capacity. Findings positive for stress incontinence, positive for detrusor overactivity. Incomplete bladder emptying noted.     ASSESSMENT AND PLAN    Ms. Slezak Hou74 y.[id number]:  1. Uterovaginal prolapse, incomplete   2. Prolapse of anterior vaginal wall   3. SUI (stress urinary incontinence, female)   4. Overactive bladder    Although she demonstrated SUI today, she had incomplete emptying. Therefore, will not do concomitant sling. If she has leakage after surgery, can reassess need.   Plan for surgery: Exam under anesthesia, sacrospinous ligament fixation, anterior repair, perineorrhaphy and cystoscopy  - We reviewed the patient's specific anatomic and functional findings, with the assistance of diagrams, and together finalized the above procedure. The planned surgical procedures were discussed along with the surgical risks outlined below, which were also provided on a detailed handout. Additional treatment options including expectant management, conservative management, medical management were discussed where appropriate.  We reviewed the benefits and risks  of each treatment option.   General Surgical Risks: For all procedures, there are risks of bleeding, infection, damage to surrounding organs including but not limited to bowel, bladder, blood vessels, ureters and nerves, and need for further surgery if an  injury were to occur. These risks are all low with minimally invasive surgery.   There are risks of numbness and weakness at any body site or buttock/rectal pain.  It is possible that baseline pain can be worsened by surgery, either with or without mesh. If surgery is vaginal, there is also a low risk of possible conversion to laparoscopy or open abdominal incision where indicated. Very rare risks include blood transfusion, blood clot, heart attack, pneumonia, or death.   There is also a risk of short-term postoperative urinary retention with need to use a catheter. About half of patients need to go home from surgery with a catheter, which is then later removed in the office. The risk of long-term need for a catheter is very low. There is also a risk of worsening of overactive bladder.    Prolapse (with or without mesh): Risk factors for surgical failure  include things that put pressure on your pelvis and the surgical repair, including obesity, chronic cough, and heavy lifting or straining (including lifting children or adults, straining on the toilet, or lifting heavy objects such as furniture or anything weighing >25 lbs. Risks of recurrence is 20-30% with vaginal native tissue repair and a less than 10% with sacrocolpopexy with mesh.     - For preop Visit:  She is required to have a visit within 30 days of her surgery.  - Medical clearance: required.  Letter sent to Dr Vernie Shanks last week requesting risk stratification and medical optimization. Patient has an appointment with him next Wednesday. She also brought her lab work from September which showed Hgb A1c of 6.9. Will need updated A1c.  - Anticoagulant use: No - Medicaid Hysterectomy form:  No - Accepts blood transfusion: Yes - Expected length of stay: outpatient  Request sent for surgery scheduling.    [name], MD   Time spent: I spent 25 minutes dedicated to the care of this patient on the date of this encounter to include pre-visit review of records, face-to-face time with the patient discussing surgery and post visit documentation. Additional time spent on the CMG procedure.

## 2020-11-11 ENCOUNTER — Encounter: Payer: Self-pay | Admitting: Obstetrics and Gynecology

## 2020-11-11 ENCOUNTER — Ambulatory Visit (INDEPENDENT_AMBULATORY_CARE_PROVIDER_SITE_OTHER): Payer: Medicare Other | Admitting: Obstetrics and Gynecology

## 2020-11-11 VITALS — BP 160/86 | HR 94 | Ht 62.0 in | Wt 164.0 lb

## 2020-11-11 DIAGNOSIS — N3281 Overactive bladder: Secondary | ICD-10-CM | POA: Diagnosis not present

## 2020-11-11 DIAGNOSIS — N393 Stress incontinence (female) (male): Secondary | ICD-10-CM | POA: Diagnosis not present

## 2020-11-11 DIAGNOSIS — N812 Incomplete uterovaginal prolapse: Secondary | ICD-10-CM

## 2020-11-11 DIAGNOSIS — N8110 Cystocele, unspecified: Secondary | ICD-10-CM

## 2020-11-11 NOTE — Patient Instructions (Signed)
Plan for surgery: sacrospinous ligament fixation, anterior repair, perineorrhaphy and cystoscopy. We will contact you regarding possible surgery dates.

## 2020-11-18 ENCOUNTER — Encounter: Payer: Self-pay | Admitting: Obstetrics and Gynecology

## 2020-11-18 DIAGNOSIS — E7800 Pure hypercholesterolemia, unspecified: Secondary | ICD-10-CM | POA: Diagnosis not present

## 2020-11-18 DIAGNOSIS — Z79899 Other long term (current) drug therapy: Secondary | ICD-10-CM | POA: Diagnosis not present

## 2020-11-18 DIAGNOSIS — I10 Essential (primary) hypertension: Secondary | ICD-10-CM | POA: Diagnosis not present

## 2020-11-18 DIAGNOSIS — E1169 Type 2 diabetes mellitus with other specified complication: Secondary | ICD-10-CM | POA: Diagnosis not present

## 2020-11-18 DIAGNOSIS — Z01818 Encounter for other preprocedural examination: Secondary | ICD-10-CM | POA: Diagnosis not present

## 2020-11-20 ENCOUNTER — Other Ambulatory Visit: Payer: Self-pay

## 2020-11-20 ENCOUNTER — Encounter: Payer: Self-pay | Admitting: Obstetrics and Gynecology

## 2020-11-20 ENCOUNTER — Ambulatory Visit (INDEPENDENT_AMBULATORY_CARE_PROVIDER_SITE_OTHER): Payer: Medicare Other | Admitting: Obstetrics and Gynecology

## 2020-11-20 VITALS — BP 151/73 | HR 81 | Ht 63.0 in | Wt 164.0 lb

## 2020-11-20 DIAGNOSIS — Z01818 Encounter for other preprocedural examination: Secondary | ICD-10-CM

## 2020-11-20 MED ORDER — POLYETHYLENE GLYCOL 3350 17 GM/SCOOP PO POWD: 1.0000 | Freq: Once | ORAL | 0 refills | Status: AC

## 2020-11-20 MED ORDER — ACETAMINOPHEN 500 MG PO TABS: 500.0000 mg | ORAL_TABLET | Freq: Four times a day (QID) | ORAL | 0 refills | Status: DC | PRN

## 2020-11-20 MED ORDER — IBUPROFEN 600 MG PO TABS
600.0000 mg | ORAL_TABLET | Freq: Four times a day (QID) | ORAL | 0 refills | Status: DC | PRN
Start: 2020-11-20 — End: 2020-12-21

## 2020-11-20 MED ORDER — OXYCODONE HCL 5 MG PO TABS
5.0000 mg | ORAL_TABLET | ORAL | 0 refills | Status: DC | PRN
Start: 2020-11-20 — End: 2021-02-02

## 2020-11-20 NOTE — H&P (Addendum)
[facility] Pre-Operative H&P  Subjective Chief Complaint: Mary Donovan presents for a preoperative encounter.   History of Present Illness: Mary Donovan is a 75 y.o. female who presents for preoperative visit.  She is scheduled to undergo Exam under anesthesia, sacrospinous ligament fixation, anterior repair, perineorrhaphy and cystoscopy on 11/30/20.  Her symptoms include vaginal bulge, and she was was found to have Stage III anterior, Stage I posterior, Stage I apical prolapse.   She has seen her PCP for medical clearance this week.  Marland Kitchen  COVID vaccination status: vaccinated  Simple CMG showed: Interpretation: CMG showed normal sensation, and normal cystometric capacity. Findings positive for stress incontinence, positive for detrusor overactivity. Incomplete bladder emptying noted with reduction of prolapse.  Past Medical History:  Diagnosis Date  . Arthritis   . Crohn disease ([place])   . Diabetes mellitus without complication ([place])   . Hypercholesterolemia   . Hypertension   . Skin cancer   . Vaginal Pap smear, abnormal 1978   hysterectomy     Past Surgical History:  Procedure Laterality Date  . BREAST REDUCTION SURGERY  1985  . Illectomy  02/1998  . left foot reconstruction  07/2011  . VAGINAL HYSTERECTOMY      is allergic to clindamycin, penicillin g, latex, and neosporin original [bacitracin-neomycin-polymyxin].   Family History  Problem Relation Age of Onset  . Cancer Mother   . Diabetes Other   . Cancer Paternal Aunt   . Breast cancer Paternal Aunt   . Cancer Daughter   . Hypertension Neg Hx     Social History   Tobacco Use  . Smoking status: Never Smoker  . Smokeless tobacco: Never Used  Vaping Use  . Vaping Use: Never used  Substance Use Topics  . Alcohol use: Not Currently  . Drug use: Never     Review of Systems was negative for a full 10 system review except as noted in the History of Present Illness.  No current  facility-administered medications for this encounter.  Current Outpatient Medications:  .  acetaminophen (TYLENOL) 500 MG tablet, Take 1 tablet (500 mg total) by mouth every 6 (six) hours as needed (pain)., Disp: 30 tablet, Rfl: 0 .  calcium carbonate 100 mg/ml SUSP, Take by mouth., Disp: , Rfl:  .  celecoxib (CELEBREX) 200 MG capsule, TAKE 1 CAPSULE EVERY DAY WITH FOOD, Disp: , Rfl:  .  CONTOUR NEXT TEST test strip, SMARTSIG:Via Meter, Disp: , Rfl:  .  estradiol (ESTRACE) 0.1 MG/GM vaginal cream, Place 0.5 g vaginally 2 (two) times a week. Place 0.5g nightly for two weeks then twice a week after, Disp: 30 g, Rfl: 11 .  furosemide (LASIX) 20 MG tablet, Take 20 mg by mouth daily., Disp: , Rfl:  .  gabapentin (NEURONTIN) 300 MG capsule, Take 300 mg by mouth daily., Disp: , Rfl:  .  glimepiride (AMARYL) 1 MG tablet, Take 1 mg by mouth daily., Disp: , Rfl:  .  ibuprofen (ADVIL) 600 MG tablet, Take 1 tablet (600 mg total) by mouth every 6 (six) hours as needed., Disp: 30 tablet, Rfl: 0 .  metFORMIN (GLUCOPHAGE) 500 MG tablet, Take 1,000 mg by mouth 2 (two) times daily., Disp: , Rfl:  .  montelukast (SINGULAIR) 10 MG tablet, Take 10 mg by mouth daily., Disp: , Rfl:  .  oxyCODONE (OXY IR/ROXICODONE) 5 MG immediate release tablet, Take 1 tablet (5 mg total) by mouth every 4 (four) hours as needed for severe pain., Disp: 15  tablet, Rfl: 0 .  polyethylene glycol powder (GLYCOLAX/MIRALAX) 17 GM/SCOOP powder, Take 255 g by mouth once for 1 dose. Drink 17g (1 scoop) dissolved in water per day., Disp: 255 g, Rfl: 0 .  simvastatin (ZOCOR) 20 MG tablet, Take 20 mg by mouth at bedtime., Disp: , Rfl:  .  valsartan (DIOVAN) 80 MG tablet, Take 80 mg by mouth daily., Disp: , Rfl:    Objective Gen: NAD, AAO x3 Lungs: normal respirations  Previous Pelvic Exam showed: POP-Q(09/02/20):  POP-Q  3 Aa  3 Ba   -4 C   6.5 Gh  4 Pb  8 tvl   -2 Ap  -2 Bp  (n/a) D      Assessment/ Plan  Assessment: The patient is a 75 y.o. year old with stage III Pelvic organ prolapse  Plan: Exam under anesthesia, sacrospinous ligament fixation, anterior repair, perineorrhaphy and cystoscopy.   [name], MD

## 2020-11-20 NOTE — Progress Notes (Signed)
[facility] Pre-Operative visit  Subjective Chief Complaint: Mary Donovan presents for a preoperative encounter.   History of Present Illness: Mary Donovan is a 75 y.o. female who presents for preoperative visit.  She is scheduled to undergo Exam under anesthesia, sacrospinous ligament fixation, anterior repair, perineorrhaphy and cystoscopy on 11/30/20.  Her symptoms include vaginal bulge, and she was was found to have Stage III anterior, Stage I posterior, Stage I apical prolapse.   She has seen her PCP for medical clearance this week.  Marland Kitchen  COVID vaccination status: vaccinated  Simple CMG showed: Interpretation: CMG showed normal sensation, and normal cystometric capacity. Findings positive for stress incontinence, positive for detrusor overactivity. Incomplete bladder emptying noted with reduction of prolapse.  Past Medical History:  Diagnosis Date  . Arthritis   . Crohn disease ([place])   . Diabetes mellitus without complication ([place])   . Hypercholesterolemia   . Hypertension   . Skin cancer   . Vaginal Pap smear, abnormal 1978   hysterectomy     Past Surgical History:  Procedure Laterality Date  . BREAST REDUCTION SURGERY  1985  . Illectomy  02/1998  . left foot reconstruction  07/2011  . VAGINAL HYSTERECTOMY      is allergic to clindamycin, penicillin g, latex, and neosporin original [bacitracin-neomycin-polymyxin].   Family History  Problem Relation Age of Onset  . Cancer Mother   . Diabetes Other   . Cancer Paternal Aunt   . Breast cancer Paternal Aunt   . Cancer Daughter   . Hypertension Neg Hx     Social History   Tobacco Use  . Smoking status: Never Smoker  . Smokeless tobacco: Never Used  Vaping Use  . Vaping Use: Never used  Substance Use Topics  . Alcohol use: Not Currently  . Drug use: Never     Review of Systems was negative for a full 10 system review except as noted in the History of Present Illness.   Current Outpatient  Medications:  .  calcium carbonate 100 mg/ml SUSP, Take by mouth., Disp: , Rfl:  .  celecoxib (CELEBREX) 200 MG capsule, TAKE 1 CAPSULE EVERY DAY WITH FOOD, Disp: , Rfl:  .  CONTOUR NEXT TEST test strip, SMARTSIG:Via Meter, Disp: , Rfl:  .  estradiol (ESTRACE) 0.1 MG/GM vaginal cream, Place 0.5 g vaginally 2 (two) times a week. Place 0.5g nightly for two weeks then twice a week after, Disp: 30 g, Rfl: 11 .  furosemide (LASIX) 20 MG tablet, Take 20 mg by mouth daily., Disp: , Rfl:  .  gabapentin (NEURONTIN) 300 MG capsule, Take 300 mg by mouth daily., Disp: , Rfl:  .  glimepiride (AMARYL) 1 MG tablet, Take 1 mg by mouth daily., Disp: , Rfl:  .  metFORMIN (GLUCOPHAGE) 500 MG tablet, Take 1,000 mg by mouth 2 (two) times daily., Disp: , Rfl:  .  montelukast (SINGULAIR) 10 MG tablet, Take 10 mg by mouth daily., Disp: , Rfl:  .  simvastatin (ZOCOR) 20 MG tablet, Take 20 mg by mouth at bedtime., Disp: , Rfl:  .  valsartan (DIOVAN) 80 MG tablet, Take 80 mg by mouth daily., Disp: , Rfl:    Objective (Physical Examination (to extent possible); Laboratory and Test Data (as available))  Previous Pelvic Exam showed: POP-Q(09/02/20):  POP-Q  3 Aa  3 Ba  -4 C   6.5 Gh  4 Pb  8 tvl   -2 Ap  -2 Bp  (n/a) D  Assessment/ Plan  Assessment: The patient is a 75 y.o. year old scheduled to undergo Exam under anesthesia, sacrospinous ligament fixation, anterior repair, perineorrhaphy and cystoscopy. Verbal consent was obtained for these procedures.  Plan: General Surgical Consent: The  patient has previously been counseled on alternative treatments, and the decision by the patient and provider was to proceed with the procedure listed above.  For all procedures, there are risks of bleeding, infection, damage to surrounding organs including but not limited to bowel, bladder, blood vessels, ureters and nerves, and need for further surgery if an injury were to occur. These risks are all low with minimally invasive surgery.   There are risks of numbness and weakness at any body site or buttock/rectal pain.  It is possible that baseline pain can be worsened by surgery, either with or without mesh. If surgery is vaginal, there is also a low risk of possible conversion to laparoscopy or open abdominal incision where indicated. Very rare risks include blood transfusion, blood clot, heart attack, pneumonia, or death.   There is also a risk of short-term postoperative urinary retention with need to use a catheter. About half of patients need to go home from surgery with a catheter, which is then later removed in the office. The risk of long-term need for a catheter is very low. There is also a risk of worsening of overactive bladder.    Prolapse (with or without mesh): Risk factors for surgical failure  include things that put pressure on your pelvis and the surgical repair, including obesity, chronic cough, and heavy lifting or straining (including lifting children or adults, straining on the toilet, or lifting heavy objects such as furniture or anything weighing >25 lbs. Risks of recurrence is 20-30% with vaginal native tissue repair and a less than 10% with sacrocolpopexy with mesh.    We discussed consent for blood products. Risks for blood transfusion include allergic reactions, other reactions that can affect different body organs and managed accordingly, transmission of infectious diseases such as HIV or Hepatitis. However, the blood is screened. Patient consents for blood  products.  Pre-operative instructions:  She was instructed to not take Aspirin/NSAIDs x 7days prior to surgery.  Antibiotic prophylaxis was ordered as indicated.  Catheter use: Patient will go home with foley if needed after post-operative voiding trial.  Post-operative instructions:  She was provided with specific post-operative instructions, including precautions and signs/symptoms for which we would recommend contacting us, in addition to daytime and after-hours contact phone numbers. This was provided on a handout.   Post-operative medications: Prescriptions for motrin, tylenol, miralax, and oxycodone were sent to her pharmacy. Discussed using ibuprofen and tylenol on a schedule to limit use of narcotics.   Laboratory testing:  No labs needed. Recent CBC, CMP and Hgb A1c performed by PCP scanned into media. Hgb is 13.2,  Cr 0.57 and Hgb A1c 6.8.    We also discussed that Covid testing will take place 2 days prior to her surgery and will get cancelled if she tests positive.    Preoperative clearance:  She does require surgical clearance- has seen her PCP this week and we will contact their office.    Post-operative follow-up:  A post-operative appointment will be made for 6 weeks from the date of surgery. If she needs a post-operative nurse visit for a voiding trial, that will be set up after she leaves the hospital.    Patient will call the clinic or use MyChart should anything change or any new  issues arise.   [name], MD

## 2020-11-22 ENCOUNTER — Encounter: Payer: Self-pay | Admitting: Obstetrics and Gynecology

## 2020-11-26 ENCOUNTER — Encounter (HOSPITAL_BASED_OUTPATIENT_CLINIC_OR_DEPARTMENT_OTHER): Payer: Self-pay | Admitting: Obstetrics and Gynecology

## 2020-11-26 ENCOUNTER — Inpatient Hospital Stay (HOSPITAL_COMMUNITY): Admission: RE | Admit: 2020-11-26 | Payer: Medicare Other | Source: Ambulatory Visit

## 2020-11-26 ENCOUNTER — Other Ambulatory Visit: Payer: Self-pay

## 2020-11-26 ENCOUNTER — Other Ambulatory Visit (HOSPITAL_COMMUNITY)
Admission: RE | Admit: 2020-11-26 | Discharge: 2020-11-26 | Disposition: A | Discharge status: Home / Self Care | Payer: Medicare Other | Source: Ambulatory Visit | Attending: Obstetrics and Gynecology | Admitting: Obstetrics and Gynecology

## 2020-11-26 DIAGNOSIS — Z01812 Encounter for preprocedural laboratory examination: Secondary | ICD-10-CM | POA: Diagnosis not present

## 2020-11-26 DIAGNOSIS — U071 COVID-19: Secondary | ICD-10-CM | POA: Diagnosis not present

## 2020-11-26 HISTORY — DX: COVID-19: U07.1

## 2020-11-26 LAB — SARS CORONAVIRUS 2 (TAT 6-24 HRS): SARS Coronavirus 2: POSITIVE — AB

## 2020-11-26 NOTE — Progress Notes (Signed)
Spoke w/ via phone for pre-op interview---pt Lab needs dos----I stat T & S ekg               Lab results------none COVID test ------11-26-2020 200 pm Arrive at -------530 am 11-30-2020 NPO after MN NO Solid Food.  Clear liquids from MN until---430 am then npo Medications to take morning of surgery -----pt wishes to only take immodium day of surgery Diabetic medication -----n/a Patient Special Instructions -----none Pre-Op special Istructions -----none Patient verbalized understanding of instructions that were given at this phone interview. Patient denies shortness of breath, chest pain, fever, cough at this phone interview.

## 2020-11-27 ENCOUNTER — Encounter: Payer: Self-pay | Admitting: Obstetrics and Gynecology

## 2020-11-27 ENCOUNTER — Telehealth: Payer: Self-pay

## 2020-11-27 NOTE — Telephone Encounter (Signed)
Called to discuss with patient about COVID-19 symptoms and the use of one of the available treatments for those with mild to moderate Covid symptoms and at a high risk of hospitalization.  Pt appears to qualify for outpatient treatment due to co-morbid conditions and/or a member of an at-risk group in accordance with the FDA Emergency Use Authorization.    Symptom onset: 1 week ago - "stuffy nose" Vaccinated: Yes Booster? Yes Immunocompromised? No Qualifiers: Diabetes  States "I'm feeling fine and don't need anything."   Mary Donovan

## 2020-11-27 NOTE — Progress Notes (Signed)
Left VM for Dr. Tommas Olp office with + covid results for this pt. The pt is to have surgery on 11/30/20 at [facility] at 0730. The pt has not been notified as there will be pertinent questions the provider needs to answer.  These are the current guidelines:  Positive Results for:  Asymptomatic: Procedure postponed and quarantined for 10 days, unless the procedure is urgent. Symptomatic: Procedure postponed and quarantined for 14 days. Hospitalized with Covid: postponed and quarantined  for 21 days. Immunocompromised: Procedure postponed and quarantine for 20 days.  The pt will not be retested for 90 days from the + result.

## 2020-11-30 NOTE — Telephone Encounter (Signed)
Pt was contacted and notified of the option for surgery in the 3rd vs the 7th. Pt said she is scared to have surgery on a Thursday the 3rd and will back in the ER over the weekend. She said she is ok with having another pre op via tele encounter.

## 2020-12-01 ENCOUNTER — Ambulatory Visit (HOSPITAL_BASED_OUTPATIENT_CLINIC_OR_DEPARTMENT_OTHER)
Admission: RE | Admit: 2020-12-01 | Payer: Medicare Other | Source: Home / Self Care | Admitting: Obstetrics and Gynecology

## 2020-12-01 HISTORY — DX: Deficiency of other specified B group vitamins: E53.8

## 2020-12-01 HISTORY — DX: Other specified postprocedural states: R11.2

## 2020-12-01 HISTORY — DX: Other complications of anesthesia, initial encounter: T88.59XA

## 2020-12-01 HISTORY — DX: Type 2 diabetes mellitus without complications: E11.9

## 2020-12-01 HISTORY — DX: Other specified postprocedural states: Z98.890

## 2020-12-01 HISTORY — DX: Uterovaginal prolapse, unspecified: N81.4

## 2020-12-01 SURGERY — PERINEOPLASTY
Anesthesia: General

## 2020-12-15 ENCOUNTER — Encounter (HOSPITAL_BASED_OUTPATIENT_CLINIC_OR_DEPARTMENT_OTHER): Payer: Self-pay | Admitting: Obstetrics and Gynecology

## 2020-12-15 ENCOUNTER — Other Ambulatory Visit: Payer: Self-pay

## 2020-12-15 NOTE — Progress Notes (Addendum)
Spoke w/ via phone for pre-op interview---pt Lab needs dos---- I stat ekg  cbc              Lab results------none COVID test ------positive covid 11-26-2020 in epic Arrive at -------1000 am 12-21-2020 NPO after MN NO Solid Food.  Clear liquids from MN until---900 am then npo Medications to take morning of surgery -----pt wishes to only take imodium prn am of surgeyr Diabetic medication -----n/a Patient Special Instructions -----none Pre-Op special Istructions -----none Patient verbalized understanding of instructions that were given at this phone interview. Patient denies shortness of breath, chest pain, fever, cough at this phone interview.

## 2020-12-16 ENCOUNTER — Encounter: Payer: Self-pay | Admitting: Obstetrics and Gynecology

## 2020-12-17 NOTE — H&P (View-Only) (Signed)
[facility] Pre-Operative visit  This was a Telephone visit. Patient consented and provided two forms of identification. She was located at home in [place]. I was located in my office at [facility].    Subjective Chief Complaint: Mary Donovan presents for a preoperative encounter.   History of Present Illness: Mary Donovan is a 75 y.o. female who presents for preoperative visit.  She is scheduled to undergo Exam under anesthesia,sacrospinous ligament fixation, anterior repair, perineorrhaphy and cystoscopy  on 11/30/20.  Her symptoms include vaginal bulge, and she was was found to have Stage III anterior, Stage I posterior, Stage I apical prolapse .  COVID vaccination status: vaccinated  Simple CMG showed: Interpretation: CMG showed normalsensation, and normalcystometric capacity. Findings positive forstress incontinence, positive fordetrusor overactivity. Incomplete bladder emptying noted with reduction of prolapse.  Past Medical History:  Diagnosis Date  . Arthritis   . Complication of anesthesia   . COVID 11/26/2020   asymptomatic  . Crohn disease ([place])   . DM type 2 (diabetes mellitus, type 2) ([place])   . Hypercholesterolemia   . Hypertension   . PONV (postoperative nausea and vomiting)   . Skin cancer    forehead and face basal cell carconoma removed  . Uterine prolapse without vaginal wall prolapse   . Vaginal Pap smear, abnormal 1978   hysterectomy  . Vitamin B 12 deficiency      Past Surgical History:  Procedure Laterality Date  . APPENDECTOMY  1999   with colon surgery  . BREAST REDUCTION SURGERY  1985  . Illectomy  02/1998  . left foot reconstruction  07/2011 left right dec 2012  . right big toe blister  drained  2013   wound caree done  . right great toe wound care  2020  . VAGINAL HYSTERECTOMY  1978   partial    is allergic to clindamycin, penicillin g, latex, and neosporin original [bacitracin-neomycin-polymyxin].   Family History   Problem Relation Age of Onset  . Cancer Mother   . Diabetes Other   . Cancer Paternal Aunt   . Breast cancer Paternal Aunt   . Cancer Daughter   . Hypertension Neg Hx     Social History   Tobacco Use  . Smoking status: Never Smoker  . Smokeless tobacco: Never Used  Vaping Use  . Vaping Use: Never used  Substance Use Topics  . Alcohol use: Yes    Comment: rare  . Drug use: Never     Review of Systems was negative for a full 10 system review except as noted in the History of Present Illness.   Current Outpatient Medications:  .  acetaminophen (TYLENOL) 500 MG tablet, Take 1 tablet (500 mg total) by mouth every 6 (six) hours as needed (pain). (Patient taking differently: Take 1,000 mg by mouth every 6 (six) hours as needed (pain).), Disp: 30 tablet, Rfl: 0 .  celecoxib (CELEBREX) 200 MG capsule, TAKE 1 CAPSULE EVERY DAY WITH FOOD, Disp: , Rfl:  .  CONTOUR NEXT TEST test strip, daily., Disp: , Rfl:  .  estradiol (ESTRACE) 0.1 MG/GM vaginal cream, Place 0.5 g vaginally 2 (two) times a week. Place 0.5g nightly for two weeks then twice a week after, Disp: 30 g, Rfl: 11 .  furosemide (LASIX) 20 MG tablet, Take 20 mg by mouth daily., Disp: , Rfl:  .  gabapentin (NEURONTIN) 300 MG capsule, Take 300 mg by mouth at bedtime., Disp: , Rfl:  .  glimepiride (AMARYL)  1 MG tablet, Take 1 mg by mouth daily., Disp: , Rfl:  .  ibuprofen (ADVIL) 600 MG tablet, Take 1 tablet (600 mg total) by mouth every 6 (six) hours as needed. (Patient not taking: Reported on 11/26/2020), Disp: 30 tablet, Rfl: 0 .  metFORMIN (GLUCOPHAGE) 500 MG tablet, Take 1,000 mg by mouth 2 (two) times daily., Disp: , Rfl:  .  montelukast (SINGULAIR) 10 MG tablet, Take 10 mg by mouth at bedtime., Disp: , Rfl:  .  OVER THE COUNTER MEDICATION, 1600 iu vitamin d daily, Disp: , Rfl:  .  OVER THE COUNTER MEDICATION, Immodium 2 tabs in am, Disp: , Rfl:  .  oxyCODONE (OXY IR/ROXICODONE) 5 MG immediate release tablet, Take 1 tablet (5  mg total) by mouth every 4 (four) hours as needed for severe pain. (Patient taking differently: Take 5 mg by mouth every 4 (four) hours as needed for severe pain. For after surgery), Disp: 15 tablet, Rfl: 0 .  PRESCRIPTION MEDICATION, Vitamin b 12 shot q month, Disp: , Rfl:  .  simvastatin (ZOCOR) 20 MG tablet, Take 20 mg by mouth daily., Disp: , Rfl:  .  UNABLE TO FIND, Med Name:calcium 600 mg bid, Disp: , Rfl:  .  UNABLE TO FIND, Med Name: magnesium 250  mg bid, Disp: , Rfl:  .  valsartan (DIOVAN) 80 MG tablet, Take 80 mg by mouth at bedtime., Disp: , Rfl:    Objective   Previous Pelvic Exam showed: POP-Q(09/02/20):  POP-Q  3 Aa  3 Ba  -4 C   6.5 Gh  4 Pb  8 tvl   -2 Ap  -2 Bp  (n/a)      Assessment/ Plan  Assessment: The patient is a 75 y.o. year old scheduled to undergo Exam under anesthesia,sacrospinous ligament fixation, anterior repair, perineorrhaphy and cystoscopy. Verbal consent was obtained for these procedures.  Plan: General Surgical Consent: The patient has previously been counseled on alternative treatments, and the decision by the patient and provider was to proceed with the procedure listed above.  For all procedures, there are risks of bleeding, infection, damage to surrounding organs including but not limited to bowel, bladder, blood vessels, ureters and nerves, and need for further surgery if an injury were to occur. These risks are all low with minimally invasive surgery.   There are risks of numbness and weakness at any body site or buttock/rectal  pain.  It is possible that baseline pain can be worsened by surgery, either with or without mesh. If surgery is vaginal, there is also a low risk of possible conversion to laparoscopy or open abdominal incision where indicated. Very rare risks include blood transfusion, blood clot, heart attack, pneumonia, or death.   There is also a risk of short-term postoperative urinary retention with need to use a catheter. About half of patients need to go home from surgery with a catheter, which is then later removed in the office. The risk of long-term need for a catheter is very low. There is also a risk of worsening of overactive bladder.   Prolapse (with or without mesh): Risk factors for surgical failure  include things that put pressure on your pelvis and the surgical repair, including obesity, chronic cough, and heavy lifting or straining (including lifting children or adults, straining on the toilet, or lifting heavy objects such as furniture or anything weighing >25 lbs. Risks of recurrence is 20-30% with vaginal native tissue repair and a less than 10% with sacrocolpopexy with mesh.  We discussed consent for blood products. Risks for blood transfusion include allergic reactions, other reactions that can affect different body organs and managed accordingly, transmission of infectious diseases such as HIV or Hepatitis. However, the blood is screened. Patient consents for blood products.  Pre-operative instructions:  She was instructed to not take Aspirin/NSAIDs x 7days prior to surgery. Antibiotic prophylaxis was ordered as indicated.  Cathter use: Patient will go home with foley if needed after post-operative voiding trial.  Post-operative instructions:  She was provided with specific post-operative instructions, including precautions and signs/symptoms for which we would recommend contacting us, in addition to daytime and after-hours contact phone numbers. This was provided on a handout.    Post-operative medications: Prescriptions for motrin, tylenol, miralax, and oxycodone were sent to her pharmacy. Discussed using ibuprofen and tylenol on a schedule to limit use of narcotics.   Laboratory testing:  No labs needed. Recent CBC, CMP and Hgb A1c performed by PCP scanned into media. Hgb is 13.2,  Cr 0.57 and Hgb A1c 6.8.Prior covid test positive, surgery is taking place 2 weeks+ after positive test.   Preoperative clearance:  She does require surgical clearance- received from PCP on 11/25/20.    Post-operative follow-up:  A post-operative appointment will be made for 6 weeks from the date of surgery. If she needs a post-operative nurse visit for a voiding trial, that will be set up after she leaves the hospital.    Patient will call the clinic or use MyChart should anything change or any new issues arise.   [name], MD

## 2020-12-17 NOTE — Progress Notes (Signed)
[facility] Pre-Operative visit  This was a Telephone visit. Patient consented and provided two forms of identification. She was located at home in [place]. I was located in my office at [facility].    Subjective Chief Complaint: Mary Donovan presents for a preoperative encounter.   History of Present Illness: Mary Donovan is a 75 y.o. female who presents for preoperative visit.  She is scheduled to undergo Exam under anesthesia,sacrospinous ligament fixation, anterior repair, perineorrhaphy and cystoscopy  on 11/30/20.  Her symptoms include vaginal bulge, and she was was found to have Stage III anterior, Stage I posterior, Stage I apical prolapse .  COVID vaccination status: vaccinated  Simple CMG showed: Interpretation: CMG showed normalsensation, and normalcystometric capacity. Findings positive forstress incontinence, positive fordetrusor overactivity. Incomplete bladder emptying noted with reduction of prolapse.  Past Medical History:  Diagnosis Date  . Arthritis   . Complication of anesthesia   . COVID 11/26/2020   asymptomatic  . Crohn disease ([place])   . DM type 2 (diabetes mellitus, type 2) ([place])   . Hypercholesterolemia   . Hypertension   . PONV (postoperative nausea and vomiting)   . Skin cancer    forehead and face basal cell carconoma removed  . Uterine prolapse without vaginal wall prolapse   . Vaginal Pap smear, abnormal 1978   hysterectomy  . Vitamin B 12 deficiency      Past Surgical History:  Procedure Laterality Date  . APPENDECTOMY  1999   with colon surgery  . BREAST REDUCTION SURGERY  1985  . Illectomy  02/1998  . left foot reconstruction  07/2011 left right dec 2012  . right big toe blister  drained  2013   wound caree done  . right great toe wound care  2020  . VAGINAL HYSTERECTOMY  1978   partial    is allergic to clindamycin, penicillin g, latex, and neosporin original [bacitracin-neomycin-polymyxin].   Family History   Problem Relation Age of Onset  . Cancer Mother   . Diabetes Other   . Cancer Paternal Aunt   . Breast cancer Paternal Aunt   . Cancer Daughter   . Hypertension Neg Hx     Social History   Tobacco Use  . Smoking status: Never Smoker  . Smokeless tobacco: Never Used  Vaping Use  . Vaping Use: Never used  Substance Use Topics  . Alcohol use: Yes    Comment: rare  . Drug use: Never     Review of Systems was negative for a full 10 system review except as noted in the History of Present Illness.   Current Outpatient Medications:  .  acetaminophen (TYLENOL) 500 MG tablet, Take 1 tablet (500 mg total) by mouth every 6 (six) hours as needed (pain). (Patient taking differently: Take 1,000 mg by mouth every 6 (six) hours as needed (pain).), Disp: 30 tablet, Rfl: 0 .  celecoxib (CELEBREX) 200 MG capsule, TAKE 1 CAPSULE EVERY DAY WITH FOOD, Disp: , Rfl:  .  CONTOUR NEXT TEST test strip, daily., Disp: , Rfl:  .  estradiol (ESTRACE) 0.1 MG/GM vaginal cream, Place 0.5 g vaginally 2 (two) times a week. Place 0.5g nightly for two weeks then twice a week after, Disp: 30 g, Rfl: 11 .  furosemide (LASIX) 20 MG tablet, Take 20 mg by mouth daily., Disp: , Rfl:  .  gabapentin (NEURONTIN) 300 MG capsule, Take 300 mg by mouth at bedtime., Disp: , Rfl:  .  glimepiride (AMARYL)  1 MG tablet, Take 1 mg by mouth daily., Disp: , Rfl:  .  ibuprofen (ADVIL) 600 MG tablet, Take 1 tablet (600 mg total) by mouth every 6 (six) hours as needed. (Patient not taking: Reported on 11/26/2020), Disp: 30 tablet, Rfl: 0 .  metFORMIN (GLUCOPHAGE) 500 MG tablet, Take 1,000 mg by mouth 2 (two) times daily., Disp: , Rfl:  .  montelukast (SINGULAIR) 10 MG tablet, Take 10 mg by mouth at bedtime., Disp: , Rfl:  .  OVER THE COUNTER MEDICATION, 1600 iu vitamin d daily, Disp: , Rfl:  .  OVER THE COUNTER MEDICATION, Immodium 2 tabs in am, Disp: , Rfl:  .  oxyCODONE (OXY IR/ROXICODONE) 5 MG immediate release tablet, Take 1 tablet (5  mg total) by mouth every 4 (four) hours as needed for severe pain. (Patient taking differently: Take 5 mg by mouth every 4 (four) hours as needed for severe pain. For after surgery), Disp: 15 tablet, Rfl: 0 .  PRESCRIPTION MEDICATION, Vitamin b 12 shot q month, Disp: , Rfl:  .  simvastatin (ZOCOR) 20 MG tablet, Take 20 mg by mouth daily., Disp: , Rfl:  .  UNABLE TO FIND, Med Name:calcium 600 mg bid, Disp: , Rfl:  .  UNABLE TO FIND, Med Name: magnesium 250  mg bid, Disp: , Rfl:  .  valsartan (DIOVAN) 80 MG tablet, Take 80 mg by mouth at bedtime., Disp: , Rfl:    Objective   Previous Pelvic Exam showed: POP-Q(09/02/20):  POP-Q  3 Aa  3 Ba  -4 C   6.5 Gh  4 Pb  8 tvl   -2 Ap  -2 Bp  (n/a)      Assessment/ Plan  Assessment: The patient is a 75 y.o. year old scheduled to undergo Exam under anesthesia,sacrospinous ligament fixation, anterior repair, perineorrhaphy and cystoscopy. Verbal consent was obtained for these procedures.  Plan: General Surgical Consent: The patient has previously been counseled on alternative treatments, and the decision by the patient and provider was to proceed with the procedure listed above.  For all procedures, there are risks of bleeding, infection, damage to surrounding organs including but not limited to bowel, bladder, blood vessels, ureters and nerves, and need for further surgery if an injury were to occur. These risks are all low with minimally invasive surgery.   There are risks of numbness and weakness at any body site or buttock/rectal  pain.  It is possible that baseline pain can be worsened by surgery, either with or without mesh. If surgery is vaginal, there is also a low risk of possible conversion to laparoscopy or open abdominal incision where indicated. Very rare risks include blood transfusion, blood clot, heart attack, pneumonia, or death.   There is also a risk of short-term postoperative urinary retention with need to use a catheter. About half of patients need to go home from surgery with a catheter, which is then later removed in the office. The risk of long-term need for a catheter is very low. There is also a risk of worsening of overactive bladder.   Prolapse (with or without mesh): Risk factors for surgical failure  include things that put pressure on your pelvis and the surgical repair, including obesity, chronic cough, and heavy lifting or straining (including lifting children or adults, straining on the toilet, or lifting heavy objects such as furniture or anything weighing >25 lbs. Risks of recurrence is 20-30% with vaginal native tissue repair and a less than 10% with sacrocolpopexy with mesh.  We discussed consent for blood products. Risks for blood transfusion include allergic reactions, other reactions that can affect different body organs and managed accordingly, transmission of infectious diseases such as HIV or Hepatitis. However, the blood is screened. Patient consents for blood products.  Pre-operative instructions:  She was instructed to not take Aspirin/NSAIDs x 7days prior to surgery. Antibiotic prophylaxis was ordered as indicated.  Cathter use: Patient will go home with foley if needed after post-operative voiding trial.  Post-operative instructions:  She was provided with specific post-operative instructions, including precautions and signs/symptoms for which we would recommend contacting us, in addition to daytime and after-hours contact phone numbers. This was provided on a handout.    Post-operative medications: Prescriptions for motrin, tylenol, miralax, and oxycodone were sent to her pharmacy. Discussed using ibuprofen and tylenol on a schedule to limit use of narcotics.   Laboratory testing:  No labs needed. Recent CBC, CMP and Hgb A1c performed by PCP scanned into media. Hgb is 13.2,  Cr 0.57 and Hgb A1c 6.8.Prior covid test positive, surgery is taking place 2 weeks+ after positive test.   Preoperative clearance:  She does require surgical clearance- received from PCP on 11/25/20.    Post-operative follow-up:  A post-operative appointment will be made for 6 weeks from the date of surgery. If she needs a post-operative nurse visit for a voiding trial, that will be set up after she leaves the hospital.    Patient will call the clinic or use MyChart should anything change or any new issues arise.   [name], MD

## 2020-12-18 ENCOUNTER — Other Ambulatory Visit: Payer: Self-pay

## 2020-12-18 ENCOUNTER — Encounter: Payer: Self-pay | Admitting: Obstetrics and Gynecology

## 2020-12-18 ENCOUNTER — Ambulatory Visit (INDEPENDENT_AMBULATORY_CARE_PROVIDER_SITE_OTHER): Payer: Medicare Other | Admitting: Obstetrics and Gynecology

## 2020-12-18 DIAGNOSIS — E119 Type 2 diabetes mellitus without complications: Secondary | ICD-10-CM | POA: Diagnosis not present

## 2020-12-18 DIAGNOSIS — E1142 Type 2 diabetes mellitus with diabetic polyneuropathy: Secondary | ICD-10-CM | POA: Diagnosis not present

## 2020-12-18 DIAGNOSIS — M2020 Hallux rigidus, unspecified foot: Secondary | ICD-10-CM | POA: Diagnosis not present

## 2020-12-18 DIAGNOSIS — Z01818 Encounter for other preprocedural examination: Secondary | ICD-10-CM

## 2020-12-18 NOTE — Patient Instructions (Signed)
POST OPERATIVE INSTRUCTIONS  General Instructions . Recovery (not bed rest) will last approximately 6 weeks . Walking is encouraged, but refrain from strenuous exercise/ housework/ heavy lifting. o No lifting >10lbs  . Nothing in the vagina- NO intercourse, tampons or douching . Bathing:  Do not submerge in water (NO swimming, bath, hot tub, etc) until after your postop visit. You can shower starting the day after surgery.  . No driving until you are not taking narcotic pain medicine and until your pain is well enough controlled that you can slam on the breaks or make sudden movements if needed.   Taking your medications 1. Please take your acetaminophen and ibuprofen on a schedule for the first 48 hours. Take 669m ibuprofen, then take 5038macetaminophen 3 hours later, then continue to alternate ibuprofen and acetaminophen. That way you are taking each type of medication every 6 hours. 2. Take the prescribed narcotic (oxycodone, tramadol, etc) as needed, with a maximum being every 4 hours.  3. Take a stool softener daily to keep your stools soft and preventing you from straining. If you have diarrhea, you decrease your stool softener. This is explained more below. We have prescribed you Miralax.  Reasons to Call the Nurse (see last page for phone numbers) . Heavy Bleeding (changing your pad every 1-2 hours) . Persistent nausea/vomiting . Fever (100.4 degrees or more) . Incision problems (pus or other fluid coming out, redness, warmth, increased pain)  Things to Expect After Surgery . Mild to Moderate pain is normal during the first day or two after surgery. If prescribed, take Ibuprofen or Tylenol first and use the stronger medicine for "break-through" pain. You can overlap these medicines because they work differently.   . Constipation   . To Prevent Constipation:  Eat a well-balanced diet including protein, grains, fresh fruit and vegetables.  Drink plenty of fluids. Walk regularly.   Depending on specific instructions from your physician: take Miralax daily and additionally you can add a stool softener (colace/ docusate) and fiber supplement. Continue as long as you're on pain medications.   . To Treat Constipation:  If you do not have a bowel movement in 2 days after surgery, you can take 2 Tbs of Milk of Magnesia 1-2 times a day until you have a bowel movement. If diarrhea occurs, decrease the amount or stop the laxative. If no results with Milk of Magnesia, you can drink a bottle of magnesium citrate which you can purchase over the counter.  . Fatigue:  This is a normal response to surgery and will improve with time.  Plan frequent rest periods throughout the day.  . Gas Pain:  This is very common but can also be very painful! Drink warm liquids such as herbal teas, bouillon or soup. Walking will help you pass more gas.  Mylicon or Gas-X can be taken over the counter.  . Leaking Urine:  Varying amounts of leakage may occur after surgery.  This should improve with time. Your bladder needs at least 3 months to recover from surgery. If you leak after surgery, be sure to mention this to your doctor at your post-op visit. If you were taking medications for overactive bladder prior to surgery, be sure to restart the medications immediately after surgery.  . Incisions: If you have incisions on your abdomen, the skin glue will dissolve on its own over time. It is ok to gently rinse with soap and water over these incisions but do not scrub.  Catheter Approximately 50%  of patients are unable to urinate after surgery and need to go home with a catheter. This allows your bladder to rest so it can return to full function. If you go home with a catheter, the office will call to set up a voiding trial a few days after surgery. For most patients, by this visit, they are able to urinate on their own. Long term catheter use is rare.   Return to Work  As work demands and recovery times vary  widely, it is hard to predict when you will want to return to work. If you have a desk job with no strenuous physical activity, and if you would like to return sooner than generally recommended, discuss this with your provider or call our office.   Post op concerns  For non-emergent issues, please call the Urogynecology Nurse. Please leave a message and someone will contact you within one business day.  You can also send a message through [place].   AFTER HOURS (After 5:00 PM and on weekends):  For urgent matters that cannot wait until the next business day. Call our office [phone] and connect to the doctor on call.  Please reserve this for important issues.   **FOR ANY TRUE EMERGENCY ISSUES CALL 911 OR GO TO THE NEAREST EMERGENCY ROOM.** Please inform our office or the doctor on call of any emergency.     APPOINTMENTS: Call [phone]

## 2020-12-21 ENCOUNTER — Encounter (HOSPITAL_BASED_OUTPATIENT_CLINIC_OR_DEPARTMENT_OTHER): Payer: Self-pay | Admitting: Obstetrics and Gynecology

## 2020-12-21 ENCOUNTER — Telehealth: Payer: Self-pay | Admitting: Obstetrics and Gynecology

## 2020-12-21 ENCOUNTER — Other Ambulatory Visit: Payer: Self-pay

## 2020-12-21 ENCOUNTER — Ambulatory Visit (HOSPITAL_BASED_OUTPATIENT_CLINIC_OR_DEPARTMENT_OTHER): Payer: Medicare Other | Admitting: Anesthesiology

## 2020-12-21 ENCOUNTER — Ambulatory Visit (HOSPITAL_BASED_OUTPATIENT_CLINIC_OR_DEPARTMENT_OTHER)
Admission: RE | Admit: 2020-12-21 | Discharge: 2020-12-21 | Disposition: A | Discharge status: Home / Self Care | Payer: Medicare Other | Attending: Obstetrics and Gynecology | Admitting: Obstetrics and Gynecology

## 2020-12-21 ENCOUNTER — Encounter (HOSPITAL_BASED_OUTPATIENT_CLINIC_OR_DEPARTMENT_OTHER)
Admission: RE | Disposition: A | Discharge status: Home / Self Care | Payer: Self-pay | Source: Home / Self Care | Attending: Obstetrics and Gynecology

## 2020-12-21 DIAGNOSIS — Z880 Allergy status to penicillin: Secondary | ICD-10-CM | POA: Insufficient documentation

## 2020-12-21 DIAGNOSIS — Z85828 Personal history of other malignant neoplasm of skin: Secondary | ICD-10-CM | POA: Insufficient documentation

## 2020-12-21 DIAGNOSIS — Z791 Long term (current) use of non-steroidal anti-inflammatories (NSAID): Secondary | ICD-10-CM | POA: Insufficient documentation

## 2020-12-21 DIAGNOSIS — Z7984 Long term (current) use of oral hypoglycemic drugs: Secondary | ICD-10-CM | POA: Diagnosis not present

## 2020-12-21 DIAGNOSIS — Z91040 Latex allergy status: Secondary | ICD-10-CM | POA: Diagnosis not present

## 2020-12-21 DIAGNOSIS — Z881 Allergy status to other antibiotic agents status: Secondary | ICD-10-CM | POA: Insufficient documentation

## 2020-12-21 DIAGNOSIS — Z8616 Personal history of COVID-19: Secondary | ICD-10-CM | POA: Diagnosis not present

## 2020-12-21 DIAGNOSIS — I10 Essential (primary) hypertension: Secondary | ICD-10-CM | POA: Diagnosis not present

## 2020-12-21 DIAGNOSIS — Z90710 Acquired absence of both cervix and uterus: Secondary | ICD-10-CM | POA: Diagnosis not present

## 2020-12-21 DIAGNOSIS — E119 Type 2 diabetes mellitus without complications: Secondary | ICD-10-CM | POA: Diagnosis not present

## 2020-12-21 DIAGNOSIS — N393 Stress incontinence (female) (male): Secondary | ICD-10-CM | POA: Diagnosis not present

## 2020-12-21 DIAGNOSIS — N8110 Cystocele, unspecified: Secondary | ICD-10-CM | POA: Diagnosis not present

## 2020-12-21 DIAGNOSIS — N993 Prolapse of vaginal vault after hysterectomy: Secondary | ICD-10-CM | POA: Diagnosis not present

## 2020-12-21 DIAGNOSIS — Z79899 Other long term (current) drug therapy: Secondary | ICD-10-CM | POA: Diagnosis not present

## 2020-12-21 DIAGNOSIS — E7800 Pure hypercholesterolemia, unspecified: Secondary | ICD-10-CM | POA: Diagnosis not present

## 2020-12-21 DIAGNOSIS — N3281 Overactive bladder: Secondary | ICD-10-CM | POA: Diagnosis not present

## 2020-12-21 DIAGNOSIS — N812 Incomplete uterovaginal prolapse: Secondary | ICD-10-CM | POA: Diagnosis not present

## 2020-12-21 HISTORY — PX: PERINEOPLASTY: SHX2218

## 2020-12-21 HISTORY — PX: CYSTOSCOPY: SHX5120

## 2020-12-21 HISTORY — PX: CYSTOCELE REPAIR: SHX163

## 2020-12-21 HISTORY — PX: ANTERIOR AND POSTERIOR REPAIR WITH SACROSPINOUS FIXATION: SHX6536

## 2020-12-21 LAB — POCT I-STAT, CHEM 8
BUN: 15 mg/dL (ref 8–23)
Calcium, Ion: 1.29 mmol/L (ref 1.15–1.40)
Chloride: 102 mmol/L (ref 98–111)
Creatinine, Ser: 0.5 mg/dL (ref 0.44–1.00)
Glucose, Bld: 141 mg/dL — ABNORMAL HIGH (ref 70–99)
HCT: 41 % (ref 36.0–46.0)
Hemoglobin: 13.9 g/dL (ref 12.0–15.0)
Potassium: 3.8 mmol/L (ref 3.5–5.1)
Sodium: 141 mmol/L (ref 135–145)
TCO2: 26 mmol/L (ref 22–32)

## 2020-12-21 LAB — CBC

## 2020-12-21 LAB — GLUCOSE, CAPILLARY: Glucose-Capillary: 158 mg/dL — ABNORMAL HIGH (ref 70–99)

## 2020-12-21 SURGERY — PERINEOPLASTY
Anesthesia: General | Site: Vagina

## 2020-12-21 MED ORDER — PHENAZOPYRIDINE HCL 100 MG PO TABS
ORAL_TABLET | ORAL | Status: AC
  Filled 2020-12-21: qty 2

## 2020-12-21 MED ORDER — PHENYLEPHRINE 40 MCG/ML (10ML) SYRINGE FOR IV PUSH (FOR BLOOD PRESSURE SUPPORT)
PREFILLED_SYRINGE | INTRAVENOUS | Status: AC
  Filled 2020-12-21: qty 10

## 2020-12-21 MED ORDER — PHENAZOPYRIDINE HCL 100 MG PO TABS
200.0000 mg | ORAL_TABLET | ORAL | Status: AC
  Administered 2020-12-21: 200 mg via ORAL

## 2020-12-21 MED ORDER — EPHEDRINE SULFATE-NACL 50-0.9 MG/10ML-% IV SOSY
PREFILLED_SYRINGE | INTRAVENOUS | Status: DC | PRN
  Administered 2020-12-21 (×3): 10 mg via INTRAVENOUS

## 2020-12-21 MED ORDER — DEXAMETHASONE SODIUM PHOSPHATE 10 MG/ML IJ SOLN
INTRAMUSCULAR | Status: AC
  Filled 2020-12-21: qty 1

## 2020-12-21 MED ORDER — FENTANYL CITRATE (PF) 100 MCG/2ML IJ SOLN
INTRAMUSCULAR | Status: DC | PRN
  Administered 2020-12-21 (×2): 50 ug via INTRAVENOUS

## 2020-12-21 MED ORDER — LIDOCAINE 2% (20 MG/ML) 5 ML SYRINGE
INTRAMUSCULAR | Status: DC | PRN
  Administered 2020-12-21: 100 mg via INTRAVENOUS

## 2020-12-21 MED ORDER — PROPOFOL 10 MG/ML IV BOLUS
INTRAVENOUS | Status: DC | PRN
  Administered 2020-12-21: 150 mg via INTRAVENOUS

## 2020-12-21 MED ORDER — PHENYLEPHRINE 40 MCG/ML (10ML) SYRINGE FOR IV PUSH (FOR BLOOD PRESSURE SUPPORT)
PREFILLED_SYRINGE | INTRAVENOUS | Status: DC | PRN
  Administered 2020-12-21 (×5): 80 ug via INTRAVENOUS

## 2020-12-21 MED ORDER — GENTAMICIN SULFATE 40 MG/ML IJ SOLN
5.0000 mg/kg | INTRAVENOUS | Status: AC
  Administered 2020-12-21: 300 mg via INTRAVENOUS
  Filled 2020-12-21: qty 7.5

## 2020-12-21 MED ORDER — METRONIDAZOLE IN NACL 5-0.79 MG/ML-% IV SOLN
INTRAVENOUS | Status: AC
  Filled 2020-12-21: qty 100

## 2020-12-21 MED ORDER — FENTANYL CITRATE (PF) 100 MCG/2ML IJ SOLN: 25.0000 ug | INTRAMUSCULAR | Status: DC | PRN

## 2020-12-21 MED ORDER — DEXAMETHASONE SODIUM PHOSPHATE 10 MG/ML IJ SOLN
INTRAMUSCULAR | Status: DC | PRN
  Administered 2020-12-21: 5 mg via INTRAVENOUS

## 2020-12-21 MED ORDER — ONDANSETRON HCL 4 MG PO TABS: 4.0000 mg | ORAL_TABLET | Freq: Every day | ORAL | 0 refills | Status: DC | PRN

## 2020-12-21 MED ORDER — FENTANYL CITRATE (PF) 100 MCG/2ML IJ SOLN
INTRAMUSCULAR | Status: AC
  Filled 2020-12-21: qty 2

## 2020-12-21 MED ORDER — ACETAMINOPHEN 500 MG PO TABS
ORAL_TABLET | ORAL | Status: AC
  Filled 2020-12-21: qty 2

## 2020-12-21 MED ORDER — OXYCODONE HCL 5 MG PO TABS: 5.0000 mg | ORAL_TABLET | Freq: Once | ORAL | Status: DC | PRN

## 2020-12-21 MED ORDER — LIDOCAINE-EPINEPHRINE 1 %-1:100000 IJ SOLN
INTRAMUSCULAR | Status: DC | PRN
  Administered 2020-12-21: 14 mL

## 2020-12-21 MED ORDER — AMISULPRIDE (ANTIEMETIC) 5 MG/2ML IV SOLN: 10.0000 mg | Freq: Once | INTRAVENOUS | Status: DC | PRN

## 2020-12-21 MED ORDER — POVIDONE-IODINE 10 % EX SWAB: 2.0000 "application " | Freq: Once | CUTANEOUS | Status: DC

## 2020-12-21 MED ORDER — EPHEDRINE 5 MG/ML INJ
INTRAVENOUS | Status: AC
  Filled 2020-12-21: qty 10

## 2020-12-21 MED ORDER — METRONIDAZOLE IN NACL 5-0.79 MG/ML-% IV SOLN
500.0000 mg | INTRAVENOUS | Status: AC
  Administered 2020-12-21: 500 mg via INTRAVENOUS

## 2020-12-21 MED ORDER — ACETAMINOPHEN 500 MG PO TABS
1000.0000 mg | ORAL_TABLET | ORAL | Status: AC
  Administered 2020-12-21: 1000 mg via ORAL

## 2020-12-21 MED ORDER — LIDOCAINE 2% (20 MG/ML) 5 ML SYRINGE
INTRAMUSCULAR | Status: AC
  Filled 2020-12-21: qty 5

## 2020-12-21 MED ORDER — ONDANSETRON HCL 4 MG/2ML IJ SOLN
INTRAMUSCULAR | Status: AC
  Filled 2020-12-21: qty 2

## 2020-12-21 MED ORDER — LACTATED RINGERS IV SOLN
INTRAVENOUS | Status: DC
  Administered 2020-12-21: 1000 mL via INTRAVENOUS

## 2020-12-21 MED ORDER — PROPOFOL 10 MG/ML IV BOLUS
INTRAVENOUS | Status: AC
  Filled 2020-12-21: qty 20

## 2020-12-21 MED ORDER — OXYCODONE HCL 5 MG/5ML PO SOLN: 5.0000 mg | Freq: Once | ORAL | Status: DC | PRN

## 2020-12-21 MED ORDER — DIPHENHYDRAMINE HCL 50 MG/ML IJ SOLN
INTRAMUSCULAR | Status: DC | PRN
  Administered 2020-12-21: 6.25 mg via INTRAVENOUS

## 2020-12-21 MED ORDER — ONDANSETRON HCL 4 MG/2ML IJ SOLN
INTRAMUSCULAR | Status: DC | PRN
  Administered 2020-12-21: 4 mg via INTRAVENOUS

## 2020-12-21 SURGICAL SUPPLY — 40 items
AGENT HMST KT MTR STRL THRMB (HEMOSTASIS)
APL PRP STRL LF DISP 70% ISPRP (MISCELLANEOUS)
BLADE SURG 15 STRL LF DISP TIS (BLADE) ×2 IMPLANT
BLADE SURG 15 STRL SS (BLADE) ×3
CANISTER SUCT 3000ML PPV (MISCELLANEOUS) ×3 IMPLANT
CHLORAPREP W/TINT 26 (MISCELLANEOUS) IMPLANT
COVER SURGICAL LIGHT HANDLE (MISCELLANEOUS) ×1 IMPLANT
COVER WAND RF STERILE (DRAPES) ×3 IMPLANT
DECANTER SPIKE VIAL GLASS SM (MISCELLANEOUS) ×1 IMPLANT
DEVICE CAPIO SLIM SINGLE (INSTRUMENTS) ×1 IMPLANT
GAUZE PACKING 2X5 YD STRL (GAUZE/BANDAGES/DRESSINGS) IMPLANT
GLOVE SURG ENC MOIS LTX SZ6 (GLOVE) ×3 IMPLANT
GLOVE SURG UNDER POLY LF SZ6.5 (GLOVE) ×3 IMPLANT
GLOVE SURG UNDER POLY LF SZ7 (GLOVE) ×3 IMPLANT
GOWN STRL REUS W/TWL LRG LVL3 (GOWN DISPOSABLE) ×3 IMPLANT
HIBICLENS CHG 4% 4OZ (MISCELLANEOUS) ×3 IMPLANT
KIT TURNOVER CYSTO (KITS) ×3 IMPLANT
MANIFOLD NEPTUNE II (INSTRUMENTS) ×1 IMPLANT
NDL MAYO 6 CRC TAPER PT (NEEDLE) IMPLANT
NEEDLE HYPO 22GX1.5 SAFETY (NEEDLE) ×3 IMPLANT
NEEDLE MAYO 6 CRC TAPER PT (NEEDLE) ×3 IMPLANT
NS IRRIG 1000ML POUR BTL (IV SOLUTION) ×3 IMPLANT
PACK VAGINAL WOMENS (CUSTOM PROCEDURE TRAY) ×3 IMPLANT
RETRACTOR LONE STAR DISPOSABLE (INSTRUMENTS) IMPLANT
RETRACTOR STAY HOOK 5MM (MISCELLANEOUS) ×1 IMPLANT
SET IRRIG Y TYPE TUR BLADDER L (SET/KITS/TRAYS/PACK) ×3 IMPLANT
SURGIFLO W/THROMBIN 8M KIT (HEMOSTASIS) IMPLANT
SUT ABS MONO DBL WITH NDL 48IN (SUTURE) ×2 IMPLANT
SUT CAPIO PGA 48IN SZ 0 (SUTURE) ×3 IMPLANT
SUT CV-0 GORETEX TFX25 36 (SUTURE) IMPLANT
SUT MON AB 2-0 SH 27 (SUTURE) IMPLANT
SUT VIC AB 0 CT1 36 (SUTURE) ×1 IMPLANT
SUT VIC AB 2-0 SH 27 (SUTURE)
SUT VIC AB 2-0 SH 27XBRD (SUTURE) IMPLANT
SUT VIC AB 3-0 SH 18 (SUTURE) IMPLANT
SUT VICRYL 2-0 SH 8X27 (SUTURE) ×1 IMPLANT
SYR BULB EAR ULCER 3OZ GRN STR (SYRINGE) ×3 IMPLANT
TOWEL OR 17X26 10 PK STRL BLUE (TOWEL DISPOSABLE) ×3 IMPLANT
TRAY FOL W/BAG SLVR 16FR STRL (SET/KITS/TRAYS/PACK) ×2 IMPLANT
TRAY FOLEY W/BAG SLVR 16FR LF (SET/KITS/TRAYS/PACK) ×3

## 2020-12-21 NOTE — Anesthesia Postprocedure Evaluation (Signed)
Anesthesia Post Note  Patient: Mary Donovan  Procedure(s) Performed: PERINEOPLASTY (N/A Vagina ) SACROSPINOUS FIXATION; (N/A Vagina ) ANTERIOR REPAIR (CYSTOCELE) (N/A Vagina ) CYSTOSCOPY (N/A Bladder)     Patient location during evaluation: PACU Anesthesia Type: General Level of consciousness: sedated and patient cooperative Pain management: pain level controlled Vital Signs Assessment: post-procedure vital signs reviewed and stable Respiratory status: spontaneous breathing Cardiovascular status: stable Anesthetic complications: no   No complications documented.  Last Vitals:  Vitals:   12/21/20 1300 12/21/20 1315  BP: 132/75 132/62  Pulse: 97 84  Resp: 13 (!) 8  Temp:    SpO2: 99% 100%    Last Pain:  Vitals:   12/21/20 1315  TempSrc:   PainSc: 0-No pain                 Nolon Nations

## 2020-12-21 NOTE — Transfer of Care (Signed)
Immediate Anesthesia Transfer of Care Note  Patient: Mary Donovan  Procedure(s) Performed: Procedure(s) (LRB): PERINEOPLASTY (N/A) SACROSPINOUS FIXATION; (N/A) ANTERIOR REPAIR (CYSTOCELE) (N/A) CYSTOSCOPY (N/A)  Patient Location: PACU  Anesthesia Type: General  Level of Consciousness: awake, oriented, sedated and patient cooperative  Airway & Oxygen Therapy: Patient Spontanous Breathing and Patient connected to face mask oxygen  Post-op Assessment: Report given to PACU RN and Post -op Vital signs reviewed and stable  Post vital signs: Reviewed and stable  Complications: No apparent anesthesia complications Last Vitals:  Vitals Value Taken Time  BP 146/67 12/21/20 1220  Temp    Pulse 94 12/21/20 1222  Resp 21 12/21/20 1222  SpO2 97 % 12/21/20 1222  Vitals shown include unvalidated device data.  Last Pain:  Vitals:   12/21/20 0959  TempSrc: Oral  PainSc: 5       Patients Stated Pain Goal: 5 (15/83/09 4076)  Complications: No complications documented.

## 2020-12-21 NOTE — Telephone Encounter (Signed)
Mary Donovan underwent a sacrospinous ligament fixation, anterior repair, perineorrhaphy and cystoscopy on 12/21/20.   She passed her voiding trial.  324m was backfilled into the bladder Voided twice in a row for a total of 2068m PVR by bladder scan was 10039m  She was discharged without a catheter. Please call her for a routine post op check. Thanks!  MicJaquita FoldsD

## 2020-12-21 NOTE — Op Note (Signed)
Operative Note  Preoperative Diagnosis: anterior vaginal prolapse and vaginal vault prolapse after hysterectomy  Postoperative Diagnosis: same  Procedures performed:  Sacrospinous ligament fixation, anterior repair, perineorrhaphy and cystoscopy  Implants: None  Attending Surgeon: Sherlene Shams, MD  Anesthesia: General endotracheal  Findings: 1. Stage III pelvic organ prolapse on pelvic exam  2. On cystourethroscopy, normal bladder and urethra without injury, lesion or foreign body. Brisk bilateral ureteral efflux noted.   Specimens: none  Estimated blood loss: 50 mL  IV fluids: 800 mL  Urine output: 026 mL  Complications: none  Procedure in Detail:  After informed consent was obtained, the patient was taken to the operating room where anesthesia was induced and found to be adequate. She was placed in dorsal lithotomy position, taking care to avoid any traction on the extremities, and then prepped and draped in the usual sterile fashion. A self-retaining lonestar retractor was placed using four elastic blue stays.  After a foley catheter was inserted into the urethra, the location of the midurethra was palpated. Two Allis clamps were along the anterior vaginal wall defect. 1% lidocaine with epinephrine was injected into the vaginal mucosa.  A vertical incision was made between these two Allis clamps with a 15 blade scalpel.  Allis clamps were placed along this incision and Metzenbaum scissors were used to undermine the vaginal mucosa along the incision.  The vaginal mucosa was then sharply dissected off to the vesicovaginal septum bilaterally to the level of the pubic rami.    For the sacrospinous ligament fixation (SSLF), the ischial spine was accessed on the right side via dissection with Mayo scissors and blunt dissection.  The sacrospinous ligament was palpated. Two 0 PDS suture was then placed at the sacrospinous ligament two fingerbreadths medial to the ischial spine, in  order to avoid the pudendal neurovascular bundle, using a Capio needle driver.  The PDS suture was attached to the vaginal epithelium on the ipsilateral side of the vaginal apex and held. Anterior plication of the vesicovaginal septum was then performed using plicating sutures of 2-0 Vicryl. The vaginal mucosal edges were trimmed and the incision reapproximated with 2-0 Vicryl in a running fashion. The SSLF suture was then tied down with excellent support of the anterior and apical vagina.  The foley catheter was removed. Cystoscopy was performed with a 70 degree 45F cystoscope. The bladder mucosa appeared normal with no injury, lesion or foreign body present. Brisk bilateral ureteral efflux was noted. The bladder was drained. The cystoscope was removed and foley was replaced.   Attention was then turned to the posterior vagina.  Two Allis clamps were placed at the hymenal ring.  1% lidocaine with epinephrine was injected into the vaginal mucosa. A diamond shaped incision was made between these clamps with a 15 blade scalpel, and the epithelium was removed. The underlying tissue was dissected from the epithelium using mayo scissors. Using 0-vicryl suture, perineal body was reapproximated.  The vaginal mucosa was reapproximated using 2-0 Vicryl in a subcutaneous and subcuticular fashion to close the perineal skin.  The vagina was copiously irrigated.  Hemostasis was noted.  Vaginal packing was not placed.  A rectal examination was normal and confirmed no sutures within the rectum.  The patient tolerated the procedure well.  She was awakened from anesthesia and transferred to the recovery room in stable condition. All counts were correct x 2.    Mary Folds, MD

## 2020-12-21 NOTE — Discharge Instructions (Signed)
POST OPERATIVE INSTRUCTIONS  General Instructions . Recovery (not bed rest) will last approximately 6 weeks . Walking is encouraged, but refrain from strenuous exercise/ housework/ heavy lifting. o No lifting >10lbs  . Nothing in the vagina- NO intercourse, tampons or douching . Bathing:  Do not submerge in water (NO swimming, bath, hot tub, etc) until after your postop visit. You can shower starting the day after surgery.  . No driving until you are not taking narcotic pain medicine and until your pain is well enough controlled that you can slam on the breaks or make sudden movements if needed.   Taking your medications 1. Please take your acetaminophen and ibuprofen on a schedule for the first 48 hours. Take 661m ibuprofen, then take 5043macetaminophen 3 hours later, then continue to alternate ibuprofen and acetaminophen. That way you are taking each type of medication every 6 hours. If you are taking celebrex, you do not need to take ibuprofen.  2. Take the prescribed narcotic (oxycodone, tramadol, etc) as needed, with a maximum being every 4 hours.  3. If needed, take a stool softener daily to keep your stools soft and preventing you from straining. If you have diarrhea, you decrease your stool softener. This is explained more below. We have prescribed you Miralax.  Reasons to Call the Nurse (see last page for phone numbers) . Heavy Bleeding (changing your pad every 1-2 hours) . Persistent nausea/vomiting . Fever (100.4 degrees or more) . Incision problems (pus or other fluid coming out, redness, warmth, increased pain)  Things to Expect After Surgery . Mild to Moderate pain is normal during the first day or two after surgery. If prescribed, take Ibuprofen or Tylenol first and use the stronger medicine for "break-through" pain. You can overlap these medicines because they work differently.   . Constipation   . To Prevent Constipation:  Eat a well-balanced diet including protein,  grains, fresh fruit and vegetables.  Drink plenty of fluids. Walk regularly.  Depending on specific instructions from your physician: take Miralax daily and additionally you can add a stool softener (colace/ docusate) and fiber supplement. Continue as long as you're on pain medications.   . To Treat Constipation:  If you do not have a bowel movement in 2 days after surgery, you can take 2 Tbs of Milk of Magnesia 1-2 times a day until you have a bowel movement. If diarrhea occurs, decrease the amount or stop the laxative. If no results with Milk of Magnesia, you can drink a bottle of magnesium citrate which you can purchase over the counter.  . Fatigue:  This is a normal response to surgery and will improve with time.  Plan frequent rest periods throughout the day.  . Gas Pain:  This is very common but can also be very painful! Drink warm liquids such as herbal teas, bouillon or soup. Walking will help you pass more gas.  Mylicon or Gas-X can be taken over the counter.  . Leaking Urine:  Varying amounts of leakage may occur after surgery.  This should improve with time. Your bladder needs at least 3 months to recover from surgery. If you leak after surgery, be sure to mention this to your doctor at your post-op visit. If you were taking medications for overactive bladder prior to surgery, be sure to restart the medications immediately after surgery.  . Incisions: If you have incisions on your abdomen, the skin glue will dissolve on its own over time. It is ok to gently rinse  with soap and water over these incisions but do not scrub.  Catheter Approximately 50% of patients are unable to urinate after surgery and need to go home with a catheter. This allows your bladder to rest so it can return to full function. If you go home with a catheter, the office will call to set up a voiding trial a few days after surgery. For most patients, by this visit, they are able to urinate on their own. Long term catheter  use is rare.   Return to Work  As work demands and recovery times vary widely, it is hard to predict when you will want to return to work. If you have a desk job with no strenuous physical activity, and if you would like to return sooner than generally recommended, discuss this with your provider or call our office.   Post op concerns  For non-emergent issues, please call the Urogynecology Nurse. Please leave a message and someone will contact you within one business day.  You can also send a message through [place].   AFTER HOURS (After 5:00 PM and on weekends):  For urgent matters that cannot wait until the next business day. Call our office [phone] and connect to the doctor on call.  Please reserve this for important issues.   **FOR ANY TRUE EMERGENCY ISSUES CALL 911 OR GO TO THE NEAREST EMERGENCY ROOM.** Please inform our office or the doctor on call of any emergency.    APPOINTMENTS: Call [phone]  Activity:  -No lifting greater than 10-15 pounds for 1 week, or as instructed by your physician.             -No sexual intercourse until your f/u visit Diet:  You may return to your normal diet tomorrow.   It is important to keep your       bowels regular during the postoperative period.  To avoid constipation, drink plenty of fluids during the day (8-10 glasses) and eat plenty of fresh fruits and vegetables.  Use a mild laxative or stool softener if necessary.  Wound Care:  You may begin showering tomorrow, or as instructed by your physician.      Return to Work as instructed by your physician.    Special Instructions:   Call your physician if any of these symptoms occur:   -temperature greater than 101 degrees Farenheit.   -redness, swelling or drainage at incision site.   -foul odor of your urine.   -a significant decrease in the amount of urine you have every day.   -severe pain not relieved by your pain medication.  Post Anesthesia Home Care  Instructions  Activity: Get plenty of rest for the remainder of the day. A responsible individual must stay with you for 24 hours following the procedure.  For the next 24 hours, DO NOT: -Drive a car -Paediatric nurse -Drink alcoholic beverages -Take any medication unless instructed by your physician -Make any legal decisions or sign important papers.  Meals: Start with liquid foods such as gelatin or soup. Progress to regular foods as tolerated. Avoid greasy, spicy, heavy foods. If nausea and/or vomiting occur, drink only clear liquids until the nausea and/or vomiting subsides. Call your physician if vomiting continues.  Special Instructions/Symptoms: Your throat may feel dry or sore from the anesthesia or the breathing tube placed in your throat during surgery. If this causes discomfort, gargle with warm salt water. The discomfort should disappear within 24 hours.

## 2020-12-21 NOTE — Anesthesia Preprocedure Evaluation (Addendum)
Anesthesia Evaluation  Patient identified by MRN, date of birth, ID band Patient awake    Reviewed: Allergy & Precautions, NPO status , Patient's Chart, lab work & pertinent test results  History of Anesthesia Complications (+) PONV and history of anesthetic complications  Airway Mallampati: II  TM Distance: >3 FB Neck ROM: Full    Dental  (+) Dental Advisory Given   Pulmonary neg pulmonary ROS,    Pulmonary exam normal breath sounds clear to auscultation       Cardiovascular hypertension, Normal cardiovascular exam Rhythm:Regular Rate:Normal     Neuro/Psych negative neurological ROS     GI/Hepatic negative GI ROS, Neg liver ROS,   Endo/Other  diabetes  Renal/GU negative Renal ROS     Musculoskeletal  (+) Arthritis ,   Abdominal   Peds  Hematology negative hematology ROS (+)   Anesthesia Other Findings   Reproductive/Obstetrics                            Anesthesia Physical Anesthesia Plan  ASA: III  Anesthesia Plan: General   Post-op Pain Management:    Induction: Intravenous  PONV Risk Score and Plan: 4 or greater and Ondansetron, Dexamethasone, Treatment may vary due to age or medical condition, Midazolam and Diphenhydramine  Airway Management Planned: LMA  Additional Equipment: None  Intra-op Plan:   Post-operative Plan: Extubation in OR  Informed Consent: I have reviewed the patients History and Physical, chart, labs and discussed the procedure including the risks, benefits and alternatives for the proposed anesthesia with the patient or authorized representative who has indicated his/her understanding and acceptance.     Dental advisory given  Plan Discussed with: CRNA  Anesthesia Plan Comments:        Anesthesia Quick Evaluation

## 2020-12-21 NOTE — Anesthesia Procedure Notes (Signed)
Procedure Name: LMA Insertion Date/Time: 12/21/2020 11:03 AM Performed by: Mechele Claude, CRNA Pre-anesthesia Checklist: Patient identified, Emergency Drugs available, Suction available and Patient being monitored Patient Re-evaluated:Patient Re-evaluated prior to induction Oxygen Delivery Method: Circle system utilized Preoxygenation: Pre-oxygenation with 100% oxygen Induction Type: IV induction Ventilation: Mask ventilation without difficulty LMA: LMA inserted LMA Size: 4.0 Number of attempts: 1 Airway Equipment and Method: Bite block Placement Confirmation: positive ETCO2 Tube secured with: Tape Dental Injury: Teeth and Oropharynx as per pre-operative assessment

## 2020-12-21 NOTE — Interval H&P Note (Signed)
History and Physical Interval Note:  12/21/2020 10:31 AM  Mary Donovan  has presented today for surgery, with the diagnosis of uterine vaginal prolapse; prolapse of anterior vaginal wall; SUI; OAB.  The various methods of treatment have been discussed with the patient and family. After consideration of risks, benefits and other options for treatment, the patient has consented to  Procedure(s) with comments: PERINEOPLASTY (N/A) ANTERIOR REPAIR WITH SACROSPINOUS FIXATION (N/A), cystoscopy.  The patient's history has been reviewed, patient examined, no change in status, stable for surgery.    Vitals:   12/21/20 0959  BP: (!) 162/84  Pulse: 89  Resp: 18  Temp: 98.1 F (36.7 C)  SpO2: 99%    Gen: NAD Lungs: Normal respirations Abd: soft, nontender Extremities: swelling in hands bilaterally   I have reviewed the patient's chart and labs.  Questions were answered to the patient's satisfaction.     [name]

## 2020-12-22 ENCOUNTER — Telehealth: Payer: Self-pay | Admitting: *Deleted

## 2020-12-22 ENCOUNTER — Encounter (HOSPITAL_BASED_OUTPATIENT_CLINIC_OR_DEPARTMENT_OTHER): Payer: Self-pay | Admitting: Obstetrics and Gynecology

## 2020-12-22 NOTE — Telephone Encounter (Signed)
Post- Op Call  Mary Donovan underwentsacrospinous ligament fixation, anterior repair, perineorrhaphy and cystoscopy on 12/21/20 with Dr Wannetta Sender. The patient reports that her pain is controlled. She is taking Tylenol for pain. She reports vaginal bleeding. She describes it as spotting.  She has had a bowel movement and is not taking anything for a bowel regimen. She was discharged without a catheter and will not need to come in for voiding trial. She passed her voiding trial.  354m was backfilled into the bladder Voided twice in a row for a total of 2034m PVR by bladder scan was 10035mAdvised to call us [place] she had an increase in pain or bleeding, or if she was unable to pass urine or stool. Pt verbalized understanding.   KimGaylyn RongN

## 2020-12-23 ENCOUNTER — Encounter: Payer: Self-pay | Admitting: Obstetrics and Gynecology

## 2021-01-05 DIAGNOSIS — D229 Melanocytic nevi, unspecified: Secondary | ICD-10-CM | POA: Diagnosis not present

## 2021-01-05 DIAGNOSIS — Z85828 Personal history of other malignant neoplasm of skin: Secondary | ICD-10-CM | POA: Diagnosis not present

## 2021-01-05 DIAGNOSIS — L821 Other seborrheic keratosis: Secondary | ICD-10-CM | POA: Diagnosis not present

## 2021-01-05 DIAGNOSIS — L905 Scar conditions and fibrosis of skin: Secondary | ICD-10-CM | POA: Diagnosis not present

## 2021-01-08 DIAGNOSIS — Z1211 Encounter for screening for malignant neoplasm of colon: Secondary | ICD-10-CM | POA: Diagnosis not present

## 2021-01-08 DIAGNOSIS — E7800 Pure hypercholesterolemia, unspecified: Secondary | ICD-10-CM | POA: Diagnosis not present

## 2021-01-08 DIAGNOSIS — Z79899 Other long term (current) drug therapy: Secondary | ICD-10-CM | POA: Diagnosis not present

## 2021-01-08 DIAGNOSIS — E538 Deficiency of other specified B group vitamins: Secondary | ICD-10-CM | POA: Diagnosis not present

## 2021-01-08 DIAGNOSIS — E1169 Type 2 diabetes mellitus with other specified complication: Secondary | ICD-10-CM | POA: Diagnosis not present

## 2021-01-08 DIAGNOSIS — Z0001 Encounter for general adult medical examination with abnormal findings: Secondary | ICD-10-CM | POA: Diagnosis not present

## 2021-01-08 DIAGNOSIS — I10 Essential (primary) hypertension: Secondary | ICD-10-CM | POA: Diagnosis not present

## 2021-01-08 DIAGNOSIS — M1990 Unspecified osteoarthritis, unspecified site: Secondary | ICD-10-CM | POA: Diagnosis not present

## 2021-01-12 ENCOUNTER — Ambulatory Visit: Payer: Medicare Other | Admitting: Obstetrics and Gynecology

## 2021-01-28 DIAGNOSIS — E538 Deficiency of other specified B group vitamins: Secondary | ICD-10-CM | POA: Diagnosis not present

## 2021-02-01 NOTE — Progress Notes (Signed)
[facility]  Date of Visit: 02/02/2021  History of Present Illness: [name] is a 75 y.o. female scheduled today for a post-operative visit.   Surgery: s/p sacrospinous ligament fixation, anterior repair, perineorrhaphy and cystoscopy on 12/21/20  She passed her postoperative void trial.   Postoperative course has been uncomplicated.   Today she reports having some dribbling of urine. Sometimes has leakage with sneezing as well.   UTI in the last 6 weeks? No  Pain? No  She has not returned to her normal activity (except for postop restrictions) Vaginal bulge? No  Stress incontinence: Yes  Urgency/frequency: Yes , occasionally. Also taking  Urge incontinence: No  Voiding dysfunction: No  Bowel issues: No . Has cut back on the imodium some.   Subjective Success: Do you usually have a bulge or something falling out that you can see or feel in the vaginal area? No  Retreatment Success: Any retreatment with surgery or pessary for any compartment? No    Medications: She has a current medication list which includes the following prescription(s): acetaminophen, celecoxib, clotrimazole-betamethasone, contour next test, estradiol, furosemide, gabapentin, glimepiride, metformin, montelukast, OVER THE COUNTER MEDICATION, OVER THE COUNTER MEDICATION, PRESCRIPTION MEDICATION, simvastatin, UNABLE TO FIND, UNABLE TO FIND, and valsartan.   Allergies: Patient is allergic to clindamycin, penicillin g, tape, latex, and neosporin original [bacitracin-neomycin-polymyxin].   Physical Exam: BP (!) 159/84   Pulse 90   Ht 5' 3"  (1.6 m)   Wt 159 lb (72.1 kg)   BMI 28.17 kg/m   Abdomen: soft, non-tender, without masses or organomegaly Pelvic Examination: Vagina: Incisions healing well. Sutures are present at incision line and at apex and there is no granulation tissue. No tenderness along the anterior or posterior vagina. No apical tenderness. No pelvic masses.   POP-Q: POP-Q  -2                                             Aa   -2                                           Ba  -7                                              C   4                                            Gh  4                                            Pb  8                                            tvl   -2.5  Ap  -2.5                                            Bp                                                 D    ---------------------------------------------------------  Assessment and Plan:  1. Post-operative state   2. Stress incontinence     - The patient was given a copy of her operative note for her records. - Can resume regular activity including exercise and intercourse,  if desired.  - Discussed avoidance of heavy lifting and straining long term to reduce the risk of recurrance.  - For her stress incontinence symptoms, discussed referral to pelvic floor physical therapy to help with pelvic floor muscle strengthening. She is agreeable to this and referral placed. If she does not see significant improvement will plan for urodynamic testing to further evaluate symptoms.   Return 3-4 months  [name], MD

## 2021-02-02 ENCOUNTER — Ambulatory Visit (INDEPENDENT_AMBULATORY_CARE_PROVIDER_SITE_OTHER): Payer: Medicare Other | Admitting: Obstetrics and Gynecology

## 2021-02-02 ENCOUNTER — Encounter: Payer: Self-pay | Admitting: Obstetrics and Gynecology

## 2021-02-02 ENCOUNTER — Other Ambulatory Visit: Payer: Self-pay

## 2021-02-02 VITALS — BP 159/84 | HR 90 | Ht 63.0 in | Wt 159.0 lb

## 2021-02-02 DIAGNOSIS — N393 Stress incontinence (female) (male): Secondary | ICD-10-CM

## 2021-02-02 DIAGNOSIS — Z1211 Encounter for screening for malignant neoplasm of colon: Secondary | ICD-10-CM | POA: Diagnosis not present

## 2021-02-02 DIAGNOSIS — Z9889 Other specified postprocedural states: Secondary | ICD-10-CM

## 2021-02-02 NOTE — Patient Instructions (Addendum)
You can return your regular activity. Avoid lifting heavy things long term to reduce risk of recurrence of prolapse.

## 2021-02-09 ENCOUNTER — Encounter: Payer: Medicare Other | Attending: Obstetrics and Gynecology | Admitting: Physical Therapy

## 2021-02-09 ENCOUNTER — Other Ambulatory Visit: Payer: Self-pay

## 2021-02-09 ENCOUNTER — Encounter: Payer: Self-pay | Admitting: Physical Therapy

## 2021-02-09 DIAGNOSIS — N393 Stress incontinence (female) (male): Secondary | ICD-10-CM | POA: Diagnosis not present

## 2021-02-09 DIAGNOSIS — M6281 Muscle weakness (generalized): Secondary | ICD-10-CM | POA: Diagnosis not present

## 2021-02-09 DIAGNOSIS — R278 Other lack of coordination: Secondary | ICD-10-CM | POA: Diagnosis not present

## 2021-02-09 NOTE — Therapy (Signed)
[place] at [facility] [address], [place], [place], [address] Phone: [phone]   Fax:  [phone]  Physical Therapy Evaluation  Patient Details  Name: Mary Donovan MRN: [id number] Date of Birth: January 06, 1946 Referring Provider (PT): Dr. Sherlene Shams   Encounter Date: 02/09/2021   PT End of Session - 02/09/21 1101    Visit Number 1    Date for PT Re-Evaluation 05/04/21    Authorization Type Medicare    PT Start Time 1030    PT Stop Time 1115    PT Time Calculation (min) 45 min    Activity Tolerance Patient tolerated treatment well    Behavior During Therapy [facility] for tasks assessed/performed           Past Medical History:  Diagnosis Date  . Arthritis   . Complication of anesthesia   . COVID 11/26/2020   asymptomatic  . Crohn disease ([place])   . DM type 2 (diabetes mellitus, type 2) ([place])   . Hypercholesterolemia   . Hypertension   . PONV (postoperative nausea and vomiting)    slowo to wake up  . Skin cancer    forehead and face basal cell carconoma removed  . Uterine prolapse without vaginal wall prolapse   . Vaginal Pap smear, abnormal 1978   hysterectomy  . Vitamin B 12 deficiency     Past Surgical History:  Procedure Laterality Date  . ANTERIOR AND POSTERIOR REPAIR WITH SACROSPINOUS FIXATION N/A 12/21/2020   Procedure: SACROSPINOUS FIXATION;;  Surgeon: [name], MD;  Location: [facility];  Service: Gynecology;  Laterality: N/A;  total time of procedure 120 min; anterior repair only, no posterior  . APPENDECTOMY  1999   with colon surgery  . BREAST REDUCTION SURGERY  1985  . CYSTOCELE REPAIR N/A 12/21/2020   Procedure: ANTERIOR REPAIR (CYSTOCELE);  Surgeon: [name], MD;  Location: [facility];  Service: Gynecology;  Laterality: N/A;  . CYSTOSCOPY N/A 12/21/2020   Procedure: CYSTOSCOPY;  Surgeon: [name], MD;  Location: [facility];  Service: Gynecology;  Laterality: N/A;  . Illectomy  02/1998  . left foot reconstruction  07/2011 left right dec 2012  . PERINEOPLASTY N/A 12/21/2020   Procedure: PERINEOPLASTY;  Surgeon: [name], MD;  Location: [facility];  Service: Gynecology;  Laterality: N/A;  . right big toe blister  drained  2013   wound caree done  . right great toe wound care  2020  . VAGINAL HYSTERECTOMY  1978   partial    There were no vitals filed for this visit.    Subjective Assessment - 02/09/21 1035    Subjective Patient had cystocele repair on 12/21/2020. Patient is having urinary leakage when getting out a low car on a incline. When her gut gets squeezed will leak.    Patient Stated Goals reduce urinary leakage without wearing a pad    Currently in Pain? No/denies    Multiple Pain Sites No              [facility] PT Assessment - 02/09/21 0001      Assessment   Medical Diagnosis N39.3 Stress incontinence    Referring Provider (PT) Dr. Sherlene Shams    Onset Date/Surgical Date 12/21/20    Prior Therapy none      Precautions   Precautions None      Restrictions   Weight Bearing Restrictions No  Balance Screen   Has the patient fallen in the past 6 months No    Has the patient had a decrease in activity level because of a fear of falling?  No    Is the patient reluctant to leave their home because of a fear of falling?  No      Home Ecologist residence      Prior Function   Level of Independence Independent      Cognition   Overall Cognitive Status Within Functional Limits for tasks assessed      Posture/Postural Control   Posture/Postural Control No significant limitations      ROM / Strength   AROM / PROM / Strength AROM;PROM;Strength      AROM   Lumbar Flexion decreased by 25%    Lumbar Extension decreased by 50% and leaked urine    Lumbar - Right Side Bend decreased by 25%    Lumbar - Left  Side Bend decreased by 25%      PROM   Right Hip External Rotation  45    Left Hip External Rotation  45      Strength   Overall Strength Comments unable to contract the lower abdominals      Palpation   Palpation comment tenderness in the lower abdominals                      Objective measurements completed on examination: See above findings.     Pelvic Floor Special Questions - 02/09/21 0001    Prior Pregnancies Yes    Number of Pregnancies 2    Number of Vaginal Deliveries 2    Any difficulty with labor and deliveries Yes   tore and did not know how much   Diastasis Recti 1 finger width above and below the umbilicus with moderate tension    Currently Sexually Active No    Urinary Leakage Yes    Activities that cause leaking Coughing;Sneezing;Other    Other activities that cause leaking getting our of a car on an incline, pressure on her gut    Urinary urgency Yes   urine urgency and fecal urgency due to her chrons   Urinary frequency when she takes a particular medication    Skin Integrity Intact    Pelvic Floor Internal Exam Patient confirmed identification and approves PT to assess pelvic floor and treatment    Exam Type Vaginal    Strength weak squeeze, no lift   holds breath with contraction   Strength # of seconds 2                    PT Education - 02/09/21 1119    Education Details Access Code: [email or web address]    Person(s) Educated Patient    Methods Explanation;Demonstration;Verbal cues;Handout    Comprehension Verbalized understanding;Returned demonstration            PT Short Term Goals - 02/09/21 1527      PT SHORT TERM GOAL #1   Title independent with initial HEP    Time 4    Period Weeks    Status New    Target Date 03/09/21      PT SHORT TERM GOAL #2   Title understand bladder irritants and how they affect the bladder    Time 4    Period Weeks    Status New    Target Date 03/09/21      PT SHORT  TERM GOAL #3   Title able  to contract the pelvic floor for 4 seconds without holding her breath    Time 4    Period Weeks    Status New    Target Date 03/09/21             PT Long Term Goals - 02/09/21 1529      PT LONG TERM GOAL #1   Title Independent with advanced HEP    Time 12    Period Weeks    Status New    Target Date 05/04/21      PT LONG TERM GOAL #2   Title able to get out of a low car on an incline with urinary leakage decreased >/= 75% due to increased strength and coordination    Time 12    Period Weeks    Status New    Target Date 05/04/21      PT LONG TERM GOAL #3   Title able to wear clothes that will place pressure on her abdomen and not leak urine due to correct pressure management and coordination of the pelvic floor    Time 12    Period Weeks    Status New    Target Date 05/04/21      PT LONG TERM GOAL #4   Title able to cough, sneeze, laugh with urinary leakage decreased >/= 75% due to increased strength of the pelvic floor    Time 12    Period Weeks    Status New    Target Date 05/04/21                  Plan - 02/09/21 1119    Clinical Impression Statement Patient is a 75 year old female s/p anterior and posterior repair with sacrospinous fixation, bystocele repair and perineoplasty on 12/21/2020. Patient will leak urine with coughing, sneezing, getting out of a low car on an incline, pressure on her gut and during the vaginal assessment leaked urine with no cause. Pelvic floor strength is 2/5 with weak squeeze and no lift. Patient will hold her breath during a pelvic floor contraction and only able to hold for 2 seconds. Diastasis recti 1 finger width above and below umbilicus with moderate tension. She is unable to contract her lower abdominals and the abdominals are tender. Decreased lumbar ROM. Bilateral hip external rotation passively is 45 degrees. When patient extended her lumbar spine she leaked urine. Patietn will benefit from skilled therapy to improve pelvic  floor strength and coordinaton to reduce her urinary leakage.    Personal Factors and Comorbidities Age;Comorbidity 3+;Education;Past/Current Experience    Comorbidities Anterior and posteroir repair with sacrospinous fixation, cystocele repair and perineoplasty 12/21/2020; vaginal hystreectomy partially 1978; Appendectomy and illectomy 02/1998; Chrons disease; Diabetes    Examination-Activity Limitations Bend;Continence;Lift    Examination-Participation Restrictions Cleaning;Community Activity;Shop;Laundry;Yard Work    Biomedical scientist Low    Rehab Potential Good    PT Frequency 1x / week    PT Duration 12 weeks    PT Treatment/Interventions ADLs/Self Care Home Management;Biofeedback;Electrical Stimulation;Manual techniques;Patient/family education;Neuromuscular re-education;Therapeutic activities;Therapeutic exercise;Spinal Manipulations    PT Next Visit Plan go over bladder irritants; engagement of the lower abdominals; go over lifting techniques to not strain the pelvic floor, pelvic floor strength with leg movements    PT Home Exercise Plan Access Code: [email or web address]    Consulted and Agree with Plan of Care Patient  Patient will benefit from skilled therapeutic intervention in order to improve the following deficits and impairments:  Decreased endurance,Decreased activity tolerance,Decreased strength,Increased fascial restricitons,Decreased range of motion,Impaired flexibility,Decreased coordination  Visit Diagnosis: Muscle weakness (generalized) - Plan: PT plan of care cert/re-cert  Other lack of coordination - Plan: PT plan of care cert/re-cert  Stress incontinence (female) (female) - Plan: PT plan of care cert/re-cert     Problem List Patient Active Problem List   Diagnosis Date Noted  . Prolapse of anterior vaginal wall 09/02/2020  . Vaginal atrophy 09/02/2020    Earlie Counts, PT 02/09/21 3:35  PM   [place] [facility] [address], [place], [place], [address] Phone: [phone]   Fax:  [phone]  Name: Mary Donovan MRN: [id number] Date of Birth: 01/08/1946

## 2021-02-09 NOTE — Patient Instructions (Signed)
Access Code: [email or web address] URL: [email or web address] Date: 02/09/2021 Prepared by: Earlie Counts  Exercises Supine Pelvic Floor Contraction - 3 x daily - 7 x weekly - 1 sets - 10 reps - 2 sec hold Earlie Counts, PT [facility] [address], [place], [place] [address] W: [phone] [email or web address]

## 2021-03-01 DIAGNOSIS — E538 Deficiency of other specified B group vitamins: Secondary | ICD-10-CM | POA: Diagnosis not present

## 2021-03-02 ENCOUNTER — Encounter: Payer: Self-pay | Admitting: Physical Therapy

## 2021-03-02 ENCOUNTER — Other Ambulatory Visit: Payer: Self-pay

## 2021-03-02 ENCOUNTER — Encounter: Payer: Medicare Other | Attending: Obstetrics and Gynecology | Admitting: Physical Therapy

## 2021-03-02 DIAGNOSIS — R278 Other lack of coordination: Secondary | ICD-10-CM

## 2021-03-02 DIAGNOSIS — N393 Stress incontinence (female) (male): Secondary | ICD-10-CM

## 2021-03-02 DIAGNOSIS — M6281 Muscle weakness (generalized): Secondary | ICD-10-CM

## 2021-03-02 NOTE — Therapy (Signed)
[place] at [facility] [address] [place], [place], [address] Phone: [phone]   Fax:  [phone]  Physical Therapy Treatment  Patient Details  Name: Mary Donovan MRN: [id number] Date of Birth: 24-Jun-1946 Referring Provider (PT): Dr. Sherlene Shams   Encounter Date: 03/02/2021   PT End of Session - 03/02/21 1212    Visit Number 2    Date for PT Re-Evaluation 05/04/21    Authorization Type Medicare    Authorization - Visit Number 1    Authorization - Number of Visits 10    PT Start Time 1130    PT Stop Time 1210    PT Time Calculation (min) 40 min    Activity Tolerance Patient tolerated treatment well    Behavior During Therapy [facility] for tasks assessed/performed           Past Medical History:  Diagnosis Date  . Arthritis   . Complication of anesthesia   . COVID 11/26/2020   asymptomatic  . Crohn disease ([place])   . DM type 2 (diabetes mellitus, type 2) ([place])   . Hypercholesterolemia   . Hypertension   . PONV (postoperative nausea and vomiting)    slowo to wake up  . Skin cancer    forehead and face basal cell carconoma removed  . Uterine prolapse without vaginal wall prolapse   . Vaginal Pap smear, abnormal 1978   hysterectomy  . Vitamin B 12 deficiency     Past Surgical History:  Procedure Laterality Date  . ANTERIOR AND POSTERIOR REPAIR WITH SACROSPINOUS FIXATION N/A 12/21/2020   Procedure: SACROSPINOUS FIXATION;;  Surgeon: [name], MD;  Location: [facility];  Service: Gynecology;  Laterality: N/A;  total time of procedure 120 min; anterior repair only, no posterior  . APPENDECTOMY  1999   with colon surgery  . BREAST REDUCTION SURGERY  1985  . CYSTOCELE REPAIR N/A 12/21/2020   Procedure: ANTERIOR REPAIR (CYSTOCELE);  Surgeon: [name], MD;  Location: [facility];  Service: Gynecology;  Laterality: N/A;  . CYSTOSCOPY N/A 12/21/2020   Procedure:  CYSTOSCOPY;  Surgeon: [name], MD;  Location: [facility];  Service: Gynecology;  Laterality: N/A;  . Illectomy  02/1998  . left foot reconstruction  07/2011 left right dec 2012  . PERINEOPLASTY N/A 12/21/2020   Procedure: PERINEOPLASTY;  Surgeon: [name], MD;  Location: [facility];  Service: Gynecology;  Laterality: N/A;  . right big toe blister  drained  2013   wound caree done  . right great toe wound care  2020  . VAGINAL HYSTERECTOMY  1978   partial    There were no vitals filed for this visit.   Subjective Assessment - 03/02/21 1135    Subjective The urinary leakage is better. I do the exercises daily.    Patient Stated Goals reduce urinary leakage without wearing a pad    Currently in Pain? No/denies                             [facility] Adult PT Treatment/Exercise - 03/02/21 0001      Self-Care   Self-Care Other Self-Care Comments    Other Self-Care Comments  discussed with patient on bladder irritants and how it affects the bladder      Lumbar Exercises: Seated   Other Seated Lumbar Exercises sitting ball squeeze with pelvic floor contraction holding for  3 seconds      Lumbar Exercises: Supine   Ab Set 5 reps;5 seconds    AB Set Limitations contracting the pelvic floor    Bridge with Ball Squeeze 10 reps;3 seconds    Bridge with [employer] Limitations with ball squeeze and pelvic floor contraction    Isometric Hip Flexion 10 reps;5 seconds   each leg with abdominal contraction   Isometric Hip Flexion Limitations then diagonal hip flexion resistance to get the obliques                  PT Education - 03/02/21 1145    Education Details instruction on bladder irritants;Access Code: [email or web address]    Person(s) Educated Patient    Methods Explanation;Handout    Comprehension Verbalized understanding            PT Short Term Goals - 03/02/21 1216      PT SHORT TERM GOAL #1   Title  independent with initial HEP    Time 4    Period Weeks    Status Achieved    Target Date 03/09/21      PT SHORT TERM GOAL #2   Title understand bladder irritants and how they affect the bladder    Time 4    Period Weeks    Status Achieved      PT SHORT TERM GOAL #3   Title able to contract the pelvic floor for 4 seconds without holding her breath    Time 4    Period Weeks    Status On-going    Target Date 03/09/21             PT Long Term Goals - 02/09/21 1529      PT LONG TERM GOAL #1   Title Independent with advanced HEP    Time 12    Period Weeks    Status New    Target Date 05/04/21      PT LONG TERM GOAL #2   Title able to get out of a low car on an incline with urinary leakage decreased >/= 75% due to increased strength and coordination    Time 12    Period Weeks    Status New    Target Date 05/04/21      PT LONG TERM GOAL #3   Title able to wear clothes that will place pressure on her abdomen and not leak urine due to correct pressure management and coordination of the pelvic floor    Time 12    Period Weeks    Status New    Target Date 05/04/21      PT LONG TERM GOAL #4   Title able to cough, sneeze, laugh with urinary leakage decreased >/= 75% due to increased strength of the pelvic floor    Time 12    Period Weeks    Status New    Target Date 05/04/21                 Plan - 03/02/21 1213    Clinical Impression Statement Patient reports she feels her urinary leakage is better. She has learned more exercises for strengthening the pelvic floor and lower abdominals. Patient continues to need verbal cues to breath with her exercises. Patient understands bladder irritatnts and how they affect the bladder. Patient will benefit from skilled therapy to improve pelvic floor strength and coordination to reduce her urinary leakage.    Personal Factors and Comorbidities Age;Comorbidity 3+;Education;Past/Current Experience  Comorbidities Anterior and  posteroir repair with sacrospinous fixation, cystocele repair and perineoplasty 12/21/2020; vaginal hystreectomy partially 1978; Appendectomy and illectomy 02/1998; Chrons disease; Diabetes    Examination-Activity Limitations Bend;Continence;Lift    Examination-Participation Restrictions Cleaning;Community Activity;Shop;Laundry;Yard Work    Merchant navy officer Evolving/Moderate complexity    Rehab Potential Good    PT Frequency 1x / week    PT Duration 12 weeks    PT Treatment/Interventions ADLs/Self Care Home Management;Biofeedback;Electrical Stimulation;Manual techniques;Patient/family education;Neuromuscular re-education;Therapeutic activities;Therapeutic exercise;Spinal Manipulations    PT Next Visit Plan go over lifting techniques to not strain the pelvic floor, pelvic floor strength with leg movements; sit to stand with pelvic floor contraction, lumbar extension with pelvic floor contraction    PT Home Exercise Plan Access Code: [email or web address]    Recommended Other Services MD signed intial note    Consulted and Agree with Plan of Care Patient           Patient will benefit from skilled therapeutic intervention in order to improve the following deficits and impairments:  Decreased endurance,Decreased activity tolerance,Decreased strength,Increased fascial restricitons,Decreased range of motion,Impaired flexibility,Decreased coordination  Visit Diagnosis: Muscle weakness (generalized)  Other lack of coordination  Stress incontinence (female) (female)     Problem List Patient Active Problem List   Diagnosis Date Noted  . Prolapse of anterior vaginal wall 09/02/2020  . Vaginal atrophy 09/02/2020    Earlie Counts, PT 03/02/21 12:17 PM   [place] [facility] [address], [place], [place], [address] Phone: [phone]   Fax:  [phone]  Name: Mary Donovan MRN: [id number] Date of Birth: 1946/04/17

## 2021-03-02 NOTE — Patient Instructions (Addendum)
Bladder Irritants  Certain foods and beverages can be irritating to the bladder.  Avoiding these irritants may decrease your symptoms of urinary urgency, frequency or bladder pain.  Even reducing your intake can help with your symptoms.  Not everyone is sensitive to all bladder irritants, so you may consider focusing on one irritant at a time, removing or reducing your intake of that irritant for 7-10 days to see if this change helps your symptoms.  Water intake is also very important.  Below is a list of bladder irritants.  Drinks: alcohol, carbonated beverages, caffeinated beverages such as coffee and tea, drinks with artificial sweeteners, citrus juices, apple juice, tomato juice  Foods: tomatoes and tomato based foods, spicy food, sugar and artificial sweeteners, vinegar, chocolate, raw onion, apples, citrus fruits, pineapple, cranberries, tomatoes, strawberries, plums, peaches, cantaloupe  Other: acidic urine (too concentrated) - see water intake info below  Substitutes you can try that are NOT irritating to the bladder: cooked onion, pears, papayas, sun-brewed decaf teas, watermelons, non-citrus herbal teas, apricots, kava and low-acid instant drinks (Postum).    WATER INTAKE: Remember to drink lots of water (aim for fluid intake of half your body weight with 2/3 of fluids being water).  You may be limiting fluids due to fear of leakage, but this can actually worsen urgency symptoms due to highly concentrated urine.  Water helps balance the pH of your urine so it doesn't become too acidic - acidic urine is a bladder irritant!  Access Code: [email or web address] URL: [email or web address] Date: 03/02/2021 Prepared by: Earlie Counts  Exercises Supine Pelvic Floor Contraction - 3 x daily - 7 x weekly - 1 sets - 10 reps - 2 sec hold Hooklying Isometric Hip Flexion - 1 x daily - 7 x weekly - 1 sets - 10 reps - 5 sec hold Hooklying Isometric Hip Flexion with Opposite Arm - 1 x daily - 7 x  weekly - 1 sets - 10 reps - 5 sec hold Supine Bridge with Mini Swiss Ball Between Knees - 1 x daily - 7 x weekly - 1 sets - 10 reps - 3 sec hold Seated Pelvic Floor Contraction with Isometric Hip Adduction - 2 x daily - 7 x weekly - 1 sets - 5 reps - 5 sec hold Earlie Counts, PT [facility] [address] [place], [place] [address] W: [phone] [email or web address]

## 2021-03-09 ENCOUNTER — Other Ambulatory Visit: Payer: Self-pay

## 2021-03-09 ENCOUNTER — Encounter: Payer: Medicare Other | Admitting: Physical Therapy

## 2021-03-09 ENCOUNTER — Encounter: Payer: Self-pay | Admitting: Physical Therapy

## 2021-03-09 DIAGNOSIS — R278 Other lack of coordination: Secondary | ICD-10-CM

## 2021-03-09 DIAGNOSIS — M6281 Muscle weakness (generalized): Secondary | ICD-10-CM | POA: Diagnosis not present

## 2021-03-09 DIAGNOSIS — N393 Stress incontinence (female) (male): Secondary | ICD-10-CM

## 2021-03-09 NOTE — Therapy (Signed)
[place] [facility] [address], [place], [place], [address] Phone: [phone]   Fax:  [phone]  Physical Therapy Treatment  Patient Details  Name: Mary Donovan MRN: [id number] Date of Birth: 01-Feb-1946 Referring Provider (PT): Dr. Sherlene Shams   Encounter Date: 03/09/2021   PT End of Session - 03/09/21 1138    Visit Number 3    Date for PT Re-Evaluation 05/04/21    Authorization - Visit Number 3    Authorization - Number of Visits 10    PT Start Time 6283    PT Stop Time 1216    PT Time Calculation (min) 41 min    Activity Tolerance Patient tolerated treatment well    Behavior During Therapy [facility] for tasks assessed/performed           Past Medical History:  Diagnosis Date  . Arthritis   . Complication of anesthesia   . COVID 11/26/2020   asymptomatic  . Crohn disease ([place])   . DM type 2 (diabetes mellitus, type 2) ([place])   . Hypercholesterolemia   . Hypertension   . PONV (postoperative nausea and vomiting)    slowo to wake up  . Skin cancer    forehead and face basal cell carconoma removed  . Uterine prolapse without vaginal wall prolapse   . Vaginal Pap smear, abnormal 1978   hysterectomy  . Vitamin B 12 deficiency     Past Surgical History:  Procedure Laterality Date  . ANTERIOR AND POSTERIOR REPAIR WITH SACROSPINOUS FIXATION N/A 12/21/2020   Procedure: SACROSPINOUS FIXATION;;  Surgeon: [name], MD;  Location[facility];  Service: Gynecology;  Laterality: N/A;  total time of procedure 120 min; anterior repair only, no posterior  . APPENDECTOMY  1999   with colon surgery  . BREAST REDUCTION SURGERY  1985  . CYSTOCELE REPAIR N/A 12/21/2020   Procedure: ANTERIOR REPAIR (CYSTOCELE);  Surgeon: [name], MD;  Location: [facility];  Service: Gynecology;  Laterality: N/A;  . CYSTOSCOPY N/A 12/21/2020   Procedure: CYSTOSCOPY;  Surgeon: [name], MD;  Location: [facility];  Service: Gynecology;  Laterality: N/A;  . Illectomy  02/1998  . left foot reconstruction  07/2011 left right dec 2012  . PERINEOPLASTY N/A 12/21/2020   Procedure: PERINEOPLASTY;  Surgeon: [name], MD;  Location: [facility];  Service: Gynecology;  Laterality: N/A;  . right big toe blister  drained  2013   wound caree done  . right great toe wound care  2020  . VAGINAL HYSTERECTOMY  1978   partial    There were no vitals filed for this visit.   Subjective Assessment - 03/09/21 1140    Subjective No pressure with mopping and vacuuming. Pads are not wet at night. Wear a pantyline.  Urinary leakage is 95% better.    Patient Stated Goals reduce urinary leakage without wearing a pad    Currently in Pain? No/denies    Multiple Pain Sites No                             [facility] Adult PT Treatment/Exercise - 03/09/21 0001      Therapeutic Activites    Therapeutic Activities Other Therapeutic Activities;Lifting    Lifting squatting to lift with pelvic floor contraction      Lumbar Exercises: Standing   Other Standing Lumbar Exercises sit to  stand with pelvic floor contraction 5 times    Other Standing Lumbar Exercises alternate shoulder and hip flexion against the wall with pelvic floor contraction 10 x each side      Lumbar Exercises: Supine   Clam 10 reps;1 second    Clam Limitations with one knee and red band with pelvic floor contraction    Bridge with Ball Squeeze 10 reps;3 seconds    Bridge with [employer] Limitations with ball squeeze and pelvic floor contraction; holding red band at chest height wiht tension                  PT Education - 03/09/21 1216    Education Details Access Code: [email or web address]; liftingwith correct technique    Person(s) Educated Patient    Methods Explanation;Demonstration;Verbal cues;Handout    Comprehension Returned demonstration;Verbalized  understanding            PT Short Term Goals - 03/02/21 1216      PT SHORT TERM GOAL #1   Title independent with initial HEP    Time 4    Period Weeks    Status Achieved    Target Date 03/09/21      PT SHORT TERM GOAL #2   Title understand bladder irritants and how they affect the bladder    Time 4    Period Weeks    Status Achieved      PT SHORT TERM GOAL #3   Title able to contract the pelvic floor for 4 seconds without holding her breath    Time 4    Period Weeks    Status On-going    Target Date 03/09/21             PT Long Term Goals - 03/09/21 1223      PT LONG TERM GOAL #1   Title Independent with advanced HEP    Time 12    Period Weeks    Status On-going      PT LONG TERM GOAL #2   Title able to get out of a low car on an incline with urinary leakage decreased >/= 75% due to increased strength and coordination    Time 12    Period Weeks    Status On-going      PT LONG TERM GOAL #3   Title able to wear clothes that will place pressure on her abdomen and not leak urine due to correct pressure management and coordination of the pelvic floor    Time 12    Period Weeks    Status On-going      PT LONG TERM GOAL #4   Title able to cough, sneeze, laugh with urinary leakage decreased >/= 75% due to increased strength of the pelvic floor    Time 12    Period Weeks    Status On-going                 Plan - 03/09/21 1139    Clinical Impression Statement Patient reports her leakage is 95% better. She is dry at [employer]. she is down to a light day pad. Patient has learned advanced exercises for pelvic floor and core. She is not feeling the bulge and reduction of pressure. Patient will benefit from skilled therapy to improve pelvic floor strength and coordination to reduce her urinary leakage.    Personal Factors and Comorbidities Age;Comorbidity 3+;Education;Past/Current Experience    Comorbidities Anterior and posteroir repair with sacrospinous fixation,  cystocele repair and perineoplasty 12/21/2020; vaginal  hystreectomy partially 1978; Appendectomy and illectomy 02/1998; Chrons disease; Diabetes    Examination-Activity Limitations Bend;Continence;Lift    Examination-Participation Restrictions Cleaning;Community Activity;Shop;Laundry;Yard Work    [employer]chant [employer] officer Evolving/Moderate complexity    Rehab Potential Good    PT Frequency 1x / week    PT Duration 12 weeks    PT Treatment/Interventions ADLs/Self Care Home Management;Biofeedback;Electrical Stimulation;Manual techniques;Patient/family education;Neuromuscular re-education;Therapeutic activities;Therapeutic exercise;Spinal Manipulations    PT Next Visit Plan lumbar extension with pelvic floor contraction; if doing well can be discharged; ask about getting out of low car; and presure from clothes on the abdomen    PT Home Exercise Plan Access Code: [email or web address]    Consulted and Agree with Plan of Care Patient           Patient will benefit from skilled therapeutic intervention in order to improve the following deficits and impairments:  Decreased endurance,Decreased activity tolerance,Decreased strength,Increased fascial restricitons,Decreased range of motion,Impaired flexibility,Decreased coordination  Visit Diagnosis: Muscle weakness (generalized)  Other lack of coordination  Stress incontinence (female) (female)     Problem List Patient Active Problem List   Diagnosis Date Noted  . Prolapse of anterior vaginal wall 09/02/2020  . Vaginal atrophy 09/02/2020    Earlie Counts, PT 03/09/21 12:25 PM    [facility] [address] [place], [place], [address] Phone: [phone]   Fax:  [phone]  Name: Mary Donovan MRN: [id number] Date of Birth: 1945-11-10

## 2021-03-09 NOTE — Patient Instructions (Signed)
Access Code: [email or web address] URL: [email or web address] Date: 03/09/2021 Prepared by: Earlie Counts  Exercises Supine Pelvic Floor Contraction - 3 x daily - 7 x weekly - 1 sets - 10 reps - 2 sec hold Supine Bridge with Mini Swiss Ball Between Knees - 1 x daily - 7 x weekly - 1 sets - 10 reps - 3 sec hold Seated Pelvic Floor Contraction with Isometric Hip Adduction - 2 x daily - 7 x weekly - 1 sets - 5 reps - 5 sec hold Squat with Chair Touch - 1 x daily - 7 x weekly - 1 sets - 10 reps [place] on Counter - 1 x daily - 7 x weekly - 3 sets - 10 reps Standing [place] at Tangent 1 x daily - 7 x weekly - 1 sets - 10 reps Supine March - 1 x daily - 7 x weekly - 1 sets - 10 reps Hooklying Single Leg Bent Knee Fallouts with Resistance - 1 x daily - 7 x weekly - 1 sets - 10 reps  Earlie Counts, PT [facility] [place] [address] [place], [place] [address] W: [phone] [email or web address]

## 2021-03-23 ENCOUNTER — Encounter: Payer: Self-pay | Admitting: Physical Therapy

## 2021-03-23 ENCOUNTER — Encounter: Payer: Medicare Other | Attending: Obstetrics and Gynecology | Admitting: Physical Therapy

## 2021-03-23 ENCOUNTER — Other Ambulatory Visit: Payer: Self-pay

## 2021-03-23 DIAGNOSIS — M6281 Muscle weakness (generalized): Secondary | ICD-10-CM | POA: Diagnosis present

## 2021-03-23 DIAGNOSIS — N393 Stress incontinence (female) (male): Secondary | ICD-10-CM | POA: Diagnosis present

## 2021-03-23 DIAGNOSIS — R278 Other lack of coordination: Secondary | ICD-10-CM

## 2021-03-23 NOTE — Therapy (Signed)
[facility] [place] at [facility] [address] [place], [place], [address] Phone: [phone]   Fax:  [phone]  Physical Therapy Treatment  Patient Details  Name: Mary Donovan MRN: [id number] Date of Birth: [birth year] Referring Provider (PT): Dr. Sherlene Shams   Encounter Date: 03/23/2021   PT End of Session - 03/23/21 1524    Visit Number 4    Date for PT Re-Evaluation 05/04/21    Authorization Type Medicare    Authorization - Visit Number 4    Authorization - Number of Visits 10    PT Start Time 1500    PT Stop Time 1550    PT Time Calculation (min) 50 min    Activity Tolerance Patient tolerated treatment well    Behavior During Therapy [name] for tasks assessed/performed           Past Medical History:  Diagnosis Date  . Arthritis   . Complication of anesthesia   . COVID 11/26/2020   asymptomatic  . Crohn disease ([place])   . DM type 2 (diabetes mellitus, type 2) ([place])   . Hypercholesterolemia   . Hypertension   . PONV (postoperative nausea and vomiting)    slowo to wake up  . Skin cancer    forehead and face basal cell carconoma removed  . Uterine prolapse without vaginal wall prolapse   . Vaginal Pap smear, abnormal 1978   hysterectomy  . Vitamin B 12 deficiency     Past Surgical History:  Procedure Laterality Date  . ANTERIOR AND POSTERIOR REPAIR WITH SACROSPINOUS FIXATION N/A 12/21/2020   Procedure: SACROSPINOUS FIXATION;;  Surgeon: [name], MD;  Location: [facility];  Service: Gynecology;  Laterality: N/A;  total time of procedure 120 min; anterior repair only, no posterior  . APPENDECTOMY  1999   with colon surgery  . BREAST REDUCTION SURGERY  1985  . CYSTOCELE REPAIR N/A 12/21/2020   Procedure: ANTERIOR REPAIR (CYSTOCELE);  Surgeon: [name], MD;  Location: [facility];  Service: Gynecology;  Laterality: N/A;  . CYSTOSCOPY N/A 12/21/2020   Procedure:  CYSTOSCOPY;  Surgeon: [name], MD;  Location: [facility];  Service: Gynecology;  Laterality: N/A;  . Illectomy  02/1998  . left foot reconstruction  07/2011 left right dec 2012  . PERINEOPLASTY N/A 12/21/2020   Procedure: PERINEOPLASTY;  Surgeon: [name], MD;  Location: [facility];  Service: Gynecology;  Laterality: N/A;  . right big toe blister  drained  2013   wound caree done  . right great toe wound care  2020  . VAGINAL HYSTERECTOMY  1978   partial    There were no vitals filed for this visit.   Subjective Assessment - 03/23/21 1504    Subjective Patient has not messed with the pads around the house. When arch her back into extension no leakage.    Patient Stated Goals reduce urinary leakage without wearing a pad    Currently in Pain? No/denies              [facility] PT Assessment - 03/23/21 0001      Assessment   Medical Diagnosis N39.3 Stress incontinence    Referring Provider (PT) Dr. Sherlene Shams    Onset Date/Surgical Date 12/21/20    Prior Therapy none      Precautions   Precautions None      Restrictions   Weight Bearing Restrictions No  [place] residence      Prior Function   Level of Independence Independent      Cognition   Overall Cognitive Status Within Functional Limits for tasks assessed      AROM   Lumbar Flexion full    Lumbar Extension full    Lumbar - Right Side Bend full    Lumbar - Left Side Bend full      PROM   Right Hip External Rotation  45    Left Hip External Rotation  50                      Pelvic Floor Special Questions - 03/23/21 0001    Urinary Leakage Yes   only wiht sneezing when she does not contract the pelvic floor   Activities that cause leaking Sneezing    Other activities that cause leaking none    External Palpation pateint had a bulge just above the introitus    Pelvic Floor Internal Exam Patient  confirmed identification and approves PT to assess pelvic floor and treatment    Exam Type Vaginal    Palpation tenderness located in the right sacrospinous ligament, along the iliococcygeus, and tightness in the posterior vaginal canal    Strength fair squeeze, definite lift             [facility] Adult PT Treatment/Exercise - 03/23/21 0001      Self-Care   Self-Care Other Self-Care Comments    Other Self-Care Comments  educated patient on what the surgery she had.; discussed with patient to continue with the estrogen cream Dr. Christin Fudge prescribed for her; discussed with her on not mowing the lawn.      Neuro Re-ed    Neuro Re-ed Details  tapping to the pelvic floor muscles for a contraction, contracting the pelvic floor muslces with the therapist finger in the vaginal canal with abdominal contraction then marching with feet on the bolster      Manual Therapy   Manual Therapy Internal Pelvic Floor    Internal Pelvic Floor manual work to the right sacrospinous ligament, along the righ tiliococcygeus, perineal body, post. vaginal canal, superior transverse perineum                  PT Education - 03/23/21 1603    Education Details Access Code: [email or web address]; education on the procedure Dr. Wannetta Sender did; education on using the estrogen cream, education on not mowing the lawne to reduce the stress on her repair    Person(s) Educated Patient    Methods Explanation;Demonstration;Verbal cues;Handout    Comprehension Verbalized understanding;Returned demonstration            PT Short Term Goals - 03/02/21 1216      PT SHORT TERM GOAL #1   Title independent with initial HEP    Time 4    Period Weeks    Status Achieved    Target Date 03/09/21      PT SHORT TERM GOAL #2   Title understand bladder irritants and how they affect the bladder    Time 4    Period Weeks    Status Achieved      PT SHORT TERM GOAL #3   Title able to contract the pelvic floor for 4 seconds without holding  her breath    Time 4    Period Weeks    Status On-going    Target Date 03/09/21  PT Long Term Goals - 03/23/21 1511      PT LONG TERM GOAL #1   Title Independent with advanced HEP    Time 12    Period Weeks    Status On-going      PT LONG TERM GOAL #2   Title able to get out of a low car on an incline with urinary leakage decreased >/= 75% due to increased strength and coordination    Time 12    Period Weeks    Status Achieved      PT LONG TERM GOAL #3   Title able to wear clothes that will place pressure on her abdomen and not leak urine due to correct pressure management and coordination of the pelvic floor    Time 12    Period Weeks    Status Achieved      PT LONG TERM GOAL #4   Title able to cough, sneeze, laugh with urinary leakage decreased >/= 75% due to increased strength of the pelvic floor    Baseline leak whdn sneezes unless she is careful then will not leak    Time 12    Period Weeks    Status On-going                 Plan - 03/23/21 1604    Clinical Impression Statement Patient reports she is only leaking with sneezing when she does not brace. Pelvic floor strength is 3/5 with a weakn lift.. when she coughs she will bear down. She has a bulge just above the intoitus. Patient is able to contract with abdominal contraction but will need tactile and verbal cues. Patient had a better contraction of the pelvic floor after manual work was done. Patient reports she does not want to return to mowing the lawn and the therapist agreed. Therapist educated patient that she is to take more rest during the day. She is very active with taking care of the household. Patient does not leak with lumbar extension. Patient does not leak with getting out of a low car. Patient will benefit from skilled therapy to improve pelvic floor strength and coordination.    Personal Factors and Comorbidities Age;Comorbidity 3+;Education;Past/Current Experience    Comorbidities  Anterior and posteroir repair with sacrospinous fixation, cystocele repair and perineoplasty 12/21/2020; vaginal hystreectomy partially 1978; Appendectomy and illectomy 02/1998; Chrons disease; Diabetes    Examination-Activity Limitations Bend;Continence;Lift    Examination-Participation Restrictions Cleaning;Community Activity;Shop;Laundry;Yard Work    [employer]chant [employer] officer Evolving/Moderate complexity    Rehab Potential Good    PT Frequency 1x / week    PT Duration 12 weeks    PT Treatment/Interventions ADLs/Self Care Home Management;Biofeedback;Electrical Stimulation;Manual techniques;Patient/family education;Neuromuscular re-education;Therapeutic activities;Therapeutic exercise;Spinal Manipulations    PT Next Visit Plan work on pelvic floor contraction in supine with arm movements and leg movements, review past HEP    PT Home Exercise Plan Access Code: [email or web address]    [email or web address] and Agree with Plan of Care Patient           Patient will benefit from skilled therapeutic intervention in order to improve the following deficits and impairments:  Decreased endurance,Decreased activity tolerance,Decreased strength,Increased fascial restricitons,Decreased range of motion,Impaired flexibility,Decreased coordination  Visit Diagnosis: Muscle weakness (generalized)  Other lack of coordination  Stress incontinence (female) (female)     Problem List Patient Active Problem List   Diagnosis Date Noted  . Prolapse of anterior vaginal wall 09/02/2020  . Vaginal atrophy 09/02/2020    Earlie Counts, PT 03/23/21 4:11  PM    [facility] [address] [place], [place], [address] Phone: [phone]   Fax:  [phone]  Name: Mary Donovan MRN: [id number] Date of Birth: 17-Mar-1946

## 2021-03-23 NOTE — Patient Instructions (Signed)
Access Code: [email or web address] URL: [email or web address] Date: 03/23/2021 Prepared by: Earlie Counts  Exercises Supine March with Elevation - 1 x daily - 7 x weekly - 1 sets - 10 reps Earlie Counts, PT [facility] [place] [address] [place], [place] [address] W: [phone] [email or web address]

## 2021-03-24 DIAGNOSIS — Z23 Encounter for immunization: Secondary | ICD-10-CM | POA: Diagnosis not present

## 2021-03-30 ENCOUNTER — Other Ambulatory Visit: Payer: Self-pay

## 2021-03-30 ENCOUNTER — Encounter: Payer: Self-pay | Admitting: Physical Therapy

## 2021-03-30 ENCOUNTER — Encounter: Payer: Medicare Other | Admitting: Physical Therapy

## 2021-03-30 DIAGNOSIS — N393 Stress incontinence (female) (male): Secondary | ICD-10-CM | POA: Diagnosis not present

## 2021-03-30 DIAGNOSIS — M6281 Muscle weakness (generalized): Secondary | ICD-10-CM | POA: Diagnosis not present

## 2021-03-30 DIAGNOSIS — R278 Other lack of coordination: Secondary | ICD-10-CM | POA: Diagnosis not present

## 2021-03-30 NOTE — Therapy (Signed)
[place] at [facility] [address], [place], [place], [address] Phone: [phone]   Fax:  [phone]  Physical Therapy Treatment  Patient Details  Name: Mary Donovan MRN: [id number] Date of Birth: 03/21/1946 Referring Provider (PT): Dr. Sherlene Shams   Encounter Date: 03/30/2021   PT End of Session - 03/30/21 1355     Visit Number 5    Date for PT Re-Evaluation 05/04/21    Authorization Type Medicare    Authorization - Visit Number 5    Authorization - Number of Visits 10    PT Start Time 1300    PT Stop Time 1350    PT Time Calculation (min) 50 min    Activity Tolerance Patient tolerated treatment well    Behavior During Therapy [facility] for tasks assessed/performed             Past Medical History:  Diagnosis Date   Arthritis    Complication of anesthesia    COVID 11/26/2020   asymptomatic   Crohn disease ([place])    DM type 2 (diabetes mellitus, type 2) ([place])    Hypercholesterolemia    Hypertension    PONV (postoperative nausea and vomiting)    slowo to wake up   Skin cancer    forehead and face basal cell carconoma removed   Uterine prolapse without vaginal wall prolapse    Vaginal Pap smear, abnormal 1978   hysterectomy   Vitamin B 12 deficiency     Past Surgical History:  Procedure Laterality Date   ANTERIOR AND POSTERIOR REPAIR WITH SACROSPINOUS FIXATION N/A 12/21/2020   Procedure: SACROSPINOUS FIXATION;;  Surgeon: [name], MD;  Location: [place];  Service: Gynecology;  Laterality: N/A;  total time of procedure 120 min; anterior repair only, no posterior   APPENDECTOMY  1999   with colon surgery   BREAST REDUCTION SURGERY  1985   CYSTOCELE REPAIR N/A 12/21/2020   Procedure: ANTERIOR REPAIR (CYSTOCELE);  Surgeon: [name], MD;  Location: [facility];  Service: Gynecology;  Laterality: N/A;   CYSTOSCOPY N/A 12/21/2020   Procedure: CYSTOSCOPY;   Surgeon: [name], MD;  Location: [facility];  Service: Gynecology;  Laterality: N/A;   Illectomy  02/1998   left foot reconstruction  07/2011 left right dec 2012   PERINEOPLASTY N/A 12/21/2020   Procedure: PERINEOPLASTY;  Surgeon: [name], MD;  Location: [facility];  Service: Gynecology;  Laterality: N/A;   right big toe blister  drained  2013   wound caree done   right great toe wound care  [place]   partial    There were no vitals filed for this visit.   Subjective Assessment - 03/30/21 1308     Subjective No changes. I have a little bulge in the vaginal area. Buttock pian is now 75% better.    Patient Stated Goals reduce urinary leakage without wearing a pad    Currently in Pain? No/denies                               [facility] Adult PT Treatment/Exercise - 03/30/21 0001       Self-Care   Self-Care Other Self-Care Comments    Other Self-Care Comments  Discussed with patient on not lifting heavy things and reviewed what she is lifting at home. Discussed with patient on places  close by she can do [place] or exercise with other Seniors      Therapeutic Activites    Therapeutic Activities Other Therapeutic Activities    Lifting squatting to lift items with breathing out , good hip flexion, and knee flexion      Lumbar Exercises: Supine   Bent Knee Raise 10 reps;1 second    Bent Knee Raise Limitations each leg with feet on bolster and tactile cues to contract the lower abodminal and pelvic floor    Bridge with [employer] 15 reps;1 second    Bridge with [employer] Limitations feet a little further from the buttocks to reduce leg cramps, VC breath out to reduce stress on the pelvic floor    Other Supine Lumbar Exercises supine with ball squeeze alternate shoulder flexion with pelvic floor and abdominals contracted holding 1# in each arm; then move arm upward side to side  contracting the abdominals and pelvic floor                    PT Education - 03/30/21 1354     Education Details different places she can do Tai Chi or exercise with other Surveyor, mining) Educated Patient    Methods Explanation;Handout    Comprehension Verbalized understanding              PT Short Term Goals - 03/02/21 1216       PT SHORT TERM GOAL #1   Title independent with initial HEP    Time 4    Period Weeks    Status Achieved    Target Date 03/09/21      PT SHORT TERM GOAL #2   Title understand bladder irritants and how they affect the bladder    Time 4    Period Weeks    Status Achieved      PT SHORT TERM GOAL #3   Title able to contract the pelvic floor for 4 seconds without holding her breath    Time 4    Period Weeks    Status On-going    Target Date 03/09/21               PT Long Term Goals - 03/23/21 1511       PT LONG TERM GOAL #1   Title Independent with advanced HEP    Time 12    Period Weeks    Status On-going      PT LONG TERM GOAL #2   Title able to get out of a low car on an incline with urinary leakage decreased >/= 75% due to increased strength and coordination    Time 12    Period Weeks    Status Achieved      PT LONG TERM GOAL #3   Title able to wear clothes that will place pressure on her abdomen and not leak urine due to correct pressure management and coordination of the pelvic floor    Time 12    Period Weeks    Status Achieved      PT LONG TERM GOAL #4   Title able to cough, sneeze, laugh with urinary leakage decreased >/= 75% due to increased strength of the pelvic floor    Baseline leak whdn sneezes unless she is careful then will not leak    Time 12    Period Weeks    Status On-going  Plan - 03/30/21 1319     Clinical Impression Statement Patient reports she is able to feel a slight bulge that is above the entrace to the introitus. She is able to exercise  with abdominal and pelvic floor contraction. Patient understands how to lift with breathing out, flexing hips and kness and contracting the pelvic floor without putting pressure on her bladder. She understands about not lifting heavy things and keeping them light to keep pressure off the bladder. Patient will benefit from skilled therapy to improve pelvic floor strength and coordination.    Personal Factors and Comorbidities Age;Comorbidity 3+;Education;Past/Current Experience    Comorbidities Anterior and posteroir repair with sacrospinous fixation, cystocele repair and perineoplasty 12/21/2020; vaginal hystreectomy partially 1978; Appendectomy and illectomy 02/1998; Chrons disease; Diabetes    Examination-Activity Limitations Bend;Continence;Lift    Examination-Participation Restrictions Cleaning;Community Activity;Shop;Laundry;Yard Work    Merchant navy officer Evolving/Moderate complexity    Rehab Potential Good    PT Frequency 1x / week    PT Duration 12 weeks    PT Treatment/Interventions ADLs/Self Care Home Management;Biofeedback;Electrical Stimulation;Manual techniques;Patient/family education;Neuromuscular re-education;Therapeutic activities;Therapeutic exercise;Spinal Manipulations    PT Next Visit Plan see if she has found a place to exercise. possible discharge    PT Home Exercise Plan Access Code: [email or web address]    Consulted and Agree with Plan of Care Patient             Patient will benefit from skilled therapeutic intervention in order to improve the following deficits and impairments:  Decreased endurance, Decreased activity tolerance, Decreased strength, Increased fascial restricitons, Decreased range of motion, Impaired flexibility, Decreased coordination  Visit Diagnosis: Muscle weakness (generalized)  Other lack of coordination  Stress incontinence (female) (female)     Problem List Patient Active Problem List   Diagnosis Date Noted   Prolapse of anterior  vaginal wall 09/02/2020   Vaginal atrophy 09/02/2020    Earlie Counts, PT 03/30/21 2:01 PM  [place] at [facility] [address], [place], [place], [address] Phone: [phone]   Fax:  [phone]  Name: Mary Donovan MRN: [id number] Date of Birth: 1946-02-27

## 2021-03-30 NOTE — Patient Instructions (Signed)
Evening Class Contact Pete:  [phone] [address] Parking Deck [place], [place], [place] at the topmost accessible level and join [place] in the [place] area. We will warm up for 15 minutes, then move into the [facility] if it's too cold out.  Tuesdays 7:00 - 8:20pm  Daytime Class Contact Sam:  [phone] [facility] Room 305 (top of the stairs) [place], [place]  Thursdays 11:00 - 12:20am   [facility] Address: [address], [place], [place] [address] Hours:  Open ? Closes 9PM Phone: [phone] [email or web address]  Earlie Counts, PT [facility] [address] [place], [place] [address] W: [phone] [email or web address]

## 2021-04-01 DIAGNOSIS — E538 Deficiency of other specified B group vitamins: Secondary | ICD-10-CM | POA: Diagnosis not present

## 2021-04-06 ENCOUNTER — Encounter: Payer: Medicare Other | Admitting: Physical Therapy

## 2021-04-06 ENCOUNTER — Other Ambulatory Visit: Payer: Self-pay

## 2021-04-06 ENCOUNTER — Encounter: Payer: Self-pay | Admitting: Physical Therapy

## 2021-04-06 DIAGNOSIS — N393 Stress incontinence (female) (male): Secondary | ICD-10-CM

## 2021-04-06 DIAGNOSIS — R278 Other lack of coordination: Secondary | ICD-10-CM

## 2021-04-06 DIAGNOSIS — M6281 Muscle weakness (generalized): Secondary | ICD-10-CM | POA: Diagnosis not present

## 2021-04-06 NOTE — Patient Instructions (Signed)
Access Code: [email or web address] URL: [email or web address] Date: 04/06/2021 Prepared by: Earlie Counts  Exercises Supine Pelvic Floor Contraction - 3 x daily - 7 x weekly - 1 sets - 10 reps - 2 sec hold Supine Bridge with Mini Swiss Ball Between Knees - 1 x daily - 7 x weekly - 1 sets - 10 reps - 3 sec hold Seated Pelvic Floor Contraction with Isometric Hip Adduction - 2 x daily - 7 x weekly - 1 sets - 5 reps - 5 sec hold Squat with Chair Touch - 1 x daily - 7 x weekly - 1 sets - 10 reps Bird Dog on Counter - 1 x daily - 7 x weekly - 3 sets - 10 reps Standing [place] [employer] [place] 1 x daily - 7 x weekly - 1 sets - 10 reps Hooklying Single Leg Bent Knee Fallouts with Resistance - 1 x daily - 7 x weekly - 2 sets - 10 reps Supine March - 1 x daily - 7 x weekly - 2 sets - 10 reps Clamshell - 1 x daily - 7 x weekly - 2 sets - 10 reps Earlie Counts, PT [facility] [place] [address], [place], [place] [address] W: [phone] [email or web address]

## 2021-04-06 NOTE — Therapy (Signed)
[place] at [facility] [address] [place], [place], [address] Phone: [phone]   Fax:  [phone]  Physical Therapy Treatment  Patient Details  Name: TAMA GROSZ MRN: [id number] Date of Birth: 1946-04-09 Referring Provider (PT): Dr. Sherlene Shams   Encounter Date: 04/06/2021   PT End of Session - 04/06/21 1352     Visit Number 6    Date for PT Re-Evaluation 05/04/21    Authorization Type Medicare    Authorization - Visit Number 6    Authorization - Number of Visits 10    PT Start Time 1300    PT Stop Time 5462    PT Time Calculation (min) 45 min    Activity Tolerance Patient tolerated treatment well    Behavior During Therapy [facility] for tasks assessed/performed             Past Medical History:  Diagnosis Date   Arthritis    Complication of anesthesia    COVID 11/26/2020   asymptomatic   Crohn disease ([place])    DM type 2 (diabetes mellitus, type 2) ([place])    Hypercholesterolemia    Hypertension    PONV (postoperative nausea and vomiting)    slowo to wake up   Skin cancer    forehead and face basal cell carconoma removed   Uterine prolapse without vaginal wall prolapse    Vaginal Pap smear, abnormal 1978   hysterectomy   Vitamin B 12 deficiency     Past Surgical History:  Procedure Laterality Date   ANTERIOR AND POSTERIOR REPAIR WITH SACROSPINOUS FIXATION N/A 12/21/2020   Procedure: SACROSPINOUS FIXATION;;  Surgeon: [name], MD;  Location: [place];  Service: Gynecology;  Laterality: N/A;  total time of procedure 120 min; anterior repair only, no posterior   APPENDECTOMY  1999   with colon surgery   BREAST REDUCTION SURGERY  1985   CYSTOCELE REPAIR N/A 12/21/2020   Procedure: ANTERIOR REPAIR (CYSTOCELE);  Surgeon: [name], MD;  Location: [facility];  Service: Gynecology;  Laterality: N/A;   CYSTOSCOPY N/A 12/21/2020   Procedure: CYSTOSCOPY;   Surgeon: [name], MD;  Location: [facility];  Service: Gynecology;  Laterality: N/A;   Illectomy  02/1998   left foot reconstruction  07/2011 left right dec 2012   PERINEOPLASTY N/A 12/21/2020   Procedure: PERINEOPLASTY;  Surgeon: [name], MD;  Location: [facility];  Service: Gynecology;  Laterality: N/A;   right big toe blister  drained  2013   wound caree done   right great toe wound care  [place]   partial    There were no vitals filed for this visit.   Subjective Assessment - 04/06/21 1306     Subjective Not soing all of the heavy stuff at home. No issues. Leakage is occasionally dependes on activity. Urinary leakag eis 85%  better.    Patient Stated Goals reduce urinary leakage without wearing a pad    Currently in Pain? No/denies                [facility] PT Assessment - 04/06/21 0001       Assessment   Medical Diagnosis N39.3 Stress incontinence    Referring Provider (PT) Dr. Sherlene Shams    Onset Date/Surgical Date 12/21/20    Prior Therapy none      Precautions   Precautions None  Restrictions   Weight Bearing Restrictions No      Home Environment   Living Environment Private residence      Prior Function   Level of Independence Independent      Cognition   Overall Cognitive Status Within Functional Limits for tasks assessed      AROM   Lumbar Flexion full    Lumbar Extension full    Lumbar - Right Side Bend full    Lumbar - Left Side Bend full      PROM   Right Hip External Rotation  45    Left Hip External Rotation  50                        Pelvic Floor Special Questions - 04/06/21 0001     Pad use thin pad for just in case    Strength fair squeeze, definite lift               [facility] Adult PT Treatment/Exercise - 04/06/21 0001       Self-Care   Self-Care Other Self-Care Comments    Other Self-Care Comments  discussed with  patient about [employer] to work on balance and core strength      Lumbar Exercises: Supine   Bent Knee Raise 10 reps;1 second    Bent Knee Raise Limitations holding a red band at shoulder height    Bridge with clamshell 10 reps;1 second    Bridge with [employer] Limitations each side  with opposite arm and leg move outward    Bridge with March 10 reps    Bridge with [employer] Limitations holding red band at shoulder height      Lumbar Exercises: Sidelying   Clam Right;Left;10 reps    Clam Limitations pressing ball into the mat and keeping the hips stable                    PT Education - 04/06/21 1351     Education Details Access Code: [email or web address]    Person(s) Educated Patient    Methods Explanation;Demonstration;Handout    Comprehension Verbalized understanding;Returned demonstration              PT Short Term Goals - 04/06/21 1311       PT SHORT TERM GOAL #1   Title independent with initial HEP    Time 4    Period Weeks    Status Achieved      PT SHORT TERM GOAL #2   Title understand bladder irritants and how they affect the bladder    Time 4    Period Weeks    Status Achieved    Target Date 03/09/21      PT SHORT TERM GOAL #3   Title able to contract the pelvic floor for 4 seconds without holding her breath    Time 4    Period Weeks    Status Achieved               PT Long Term Goals - 04/06/21 1311       PT LONG TERM GOAL #1   Title Independent with advanced HEP    Time 12    Period Weeks    Status Achieved      PT LONG TERM GOAL #2   Title able to get out of a low car on an incline with urinary leakage decreased >/= 75% due to increased strength and coordination  Time 12    Period Weeks    Status Achieved      PT LONG TERM GOAL #3   Title able to wear clothes that will place pressure on her abdomen and not leak urine due to correct pressure management and coordination of the pelvic floor    Time 12    Period Weeks    Status  Achieved      PT LONG TERM GOAL #4   Title able to cough, sneeze, laugh with urinary leakage decreased >/= 75% due to increased strength of the pelvic floor    Time 12    Period Weeks    Status Achieved                   Plan - 04/06/21 1353     Clinical Impression Statement Patient reports her leakage is 85% better and she will wear a thin pad for just in case. She is doing her HEP daily. Pelvic floor strength is 3/5. She does have aslight bulge that is abouve the introitus . Patient is lifting things correctly that are not heavy. She is having her son and daughter-in-law do more of the heavy stuff and picking items off the ground. She is able to get out of a low car without leakage. She is able to wear tight pants around her mid section without leakage. Patient is ready for discharge.    Personal Factors and Comorbidities Age;Comorbidity 3+;Education;Past/Current Experience    Comorbidities Anterior and posteroir repair with sacrospinous fixation, cystocele repair and perineoplasty 12/21/2020; vaginal hystreectomy partially 1978; Appendectomy and illectomy 02/1998; Chrons disease; Diabetes    Examination-Activity Limitations Bend;Continence;Lift    Examination-Participation Restrictions Cleaning;Community Activity;Shop;Laundry;Yard Work    Stability/Clinical Decision Making Evolving/Moderate complexity    Rehab Potential Good    PT Treatment/Interventions ADLs/Self Care Home Management;Biofeedback;Electrical Stimulation;Manual techniques;Patient/family education;Neuromuscular re-education;Therapeutic activities;Therapeutic exercise;Spinal Manipulations    PT Next Visit Plan Discharge to HEP    PT Home Exercise Plan Access Code: [email or web address]    Consulted and Agree with Plan of Care Patient             Patient will benefit from skilled therapeutic intervention in order to improve the following deficits and impairments:  Decreased endurance, Decreased activity tolerance, Decreased  strength, Increased fascial restricitons, Decreased range of motion, Impaired flexibility, Decreased coordination  Visit Diagnosis: Muscle weakness (generalized)  Other lack of coordination  Stress incontinence (female) (female)     Problem List Patient Active Problem List   Diagnosis Date Noted   Prolapse of anterior vaginal wall 09/02/2020   Vaginal atrophy 09/02/2020    Earlie Counts, PT 04/06/21 1:57 PM   [place] at [place] [facility] [address] [place], [place], [address] Phone: [phone]   Fax:  [phone]  Name: EVELYNA FOLKER MRN: [id number] Date of Birth: 10-03-1946  PHYSICAL THERAPY DISCHARGE SUMMARY  Visits from Start of Care: 6  Current functional level related to goals / functional outcomes: See above.    Remaining deficits: See above.    Education / Equipment: HEP   Patient agrees to discharge. Patient goals were met. Patient is being discharged due to meeting the stated rehab goals. Thank you for the referral. Earlie Counts, PT 04/06/21 1:58 PM

## 2021-04-30 DIAGNOSIS — E538 Deficiency of other specified B group vitamins: Secondary | ICD-10-CM | POA: Diagnosis not present

## 2021-05-28 ENCOUNTER — Other Ambulatory Visit: Payer: Self-pay

## 2021-05-28 ENCOUNTER — Ambulatory Visit (INDEPENDENT_AMBULATORY_CARE_PROVIDER_SITE_OTHER): Payer: Medicare Other | Admitting: Obstetrics and Gynecology

## 2021-05-28 ENCOUNTER — Encounter: Payer: Self-pay | Admitting: Obstetrics and Gynecology

## 2021-05-28 VITALS — BP 163/76 | HR 85

## 2021-05-28 DIAGNOSIS — N393 Stress incontinence (female) (male): Secondary | ICD-10-CM | POA: Diagnosis not present

## 2021-05-28 DIAGNOSIS — N8110 Cystocele, unspecified: Secondary | ICD-10-CM

## 2021-05-28 DIAGNOSIS — N3941 Urge incontinence: Secondary | ICD-10-CM

## 2021-05-28 NOTE — Progress Notes (Signed)
[name] Return Visit  SUBJECTIVE  History of Present Illness: Mary Donovan is a 75 y.o. female seen in follow-up for incontinence. Plan at last visit was to start pelvic floor physical therapy. .   Surgery: s/p sacrospinous ligament fixation, anterior repair, perineorrhaphy and cystoscopy on 12/21/20  Has less leakage with cough or sneeze. Has more of an issue with urgency.  She feels like PT has helped, is still doing the exercises.   Sometime feels like bladder has dropped again. Felt that way since June. Has been trying to avoid anything strenuous.   Past Medical History: Patient  has a past medical history of Arthritis, Complication of anesthesia, COVID (11/26/2020), Crohn disease ([place]), DM type 2 (diabetes mellitus, type 2) ([place]), Hypercholesterolemia, Hypertension, PONV (postoperative nausea and vomiting), Skin cancer, Uterine prolapse without vaginal wall prolapse, Vaginal Pap smear, abnormal (1978), and Vitamin B 12 deficiency.   Past Surgical History: She  has a past surgical history that includes Breast reduction surgery (1985); Illectomy (02/1998); left foot reconstruction (07/2011 left right dec 2012); Vaginal hysterectomy (1978); right big toe blister  drained (2013); right great toe wound care (2020); Appendectomy (1999); Perineoplasty (N/A, 12/21/2020); Anterior and posterior repair with sacrospinous fixation (N/A, 12/21/2020); Cystocele repair (N/A, 12/21/2020); and Cystoscopy (N/A, 12/21/2020).   Medications: She has a current medication list which includes the following prescription(s): acetaminophen, celecoxib, clotrimazole-betamethasone, contour next test, estradiol, furosemide, gabapentin, glimepiride, metformin, montelukast, OVER THE COUNTER MEDICATION, OVER THE COUNTER MEDICATION, PRESCRIPTION MEDICATION, simvastatin, UNABLE TO FIND, UNABLE TO FIND, and valsartan.   Allergies: Patient is allergic to clindamycin, penicillin g, tape, latex, and neosporin  original [bacitracin-neomycin-polymyxin].   Social History: Patient  reports that she has never smoked. She has never used smokeless tobacco. She reports current alcohol use. She reports that she does not use drugs.      OBJECTIVE     Physical Exam: Vitals:   05/28/21 1256  BP: (!) 163/76  Pulse: 85   Gen: No apparent distress, A&O x 3.  Detailed Urogynecologic Evaluation:  Deferred. Prior exam showed:  POP-Q  0.5                                            Aa   0.5                                           Ba  -6                                              C   5                                            Gh  4                                            Pb  7  tvl   -3                                            Ap  -3                                            Bp                                                 D      ASSESSMENT AND PLAN    Mary Donovan is a 75 y.o. with:  1. Prolapse of anterior vaginal wall   2. SUI (stress urinary incontinence, female)   3. Urge incontinence    - Prolapse has recurrent anteriorly. We discussed conservative vs surgical management. She is interested in surgery again, would opt for sacrocolpopexy. She expressed concern that she has had an illectomy due to crohns and had a large abdominal incision. We discussed that I could attempt robotic procedure and if there was too much scar tissue, then we would abandon the robotic procedure and perform vaginal repair instead.  - Will have her undergo urodynamic testing due to her mixed incontinence. Previously only had simple CMG and sling was not performed due to incomplete emptying.   Return for UDS  [name], MD  Time spent: I spent 30 minutes dedicated to the care of this patient on the date of this encounter to include pre-visit review of records, face-to-face time with the patient discussing options and post visit documentation.

## 2021-05-31 DIAGNOSIS — E538 Deficiency of other specified B group vitamins: Secondary | ICD-10-CM | POA: Diagnosis not present

## 2021-06-04 ENCOUNTER — Ambulatory Visit (INDEPENDENT_AMBULATORY_CARE_PROVIDER_SITE_OTHER): Payer: Medicare Other | Admitting: Obstetrics and Gynecology

## 2021-06-04 ENCOUNTER — Encounter: Payer: Self-pay | Admitting: Obstetrics and Gynecology

## 2021-06-04 ENCOUNTER — Other Ambulatory Visit: Payer: Self-pay

## 2021-06-04 VITALS — BP 143/87 | HR 57

## 2021-06-04 DIAGNOSIS — R350 Frequency of micturition: Secondary | ICD-10-CM

## 2021-06-04 DIAGNOSIS — N393 Stress incontinence (female) (male): Secondary | ICD-10-CM

## 2021-06-04 LAB — POCT URINALYSIS DIPSTICK
Appearance: NORMAL
Bilirubin, UA: NEGATIVE
Blood, UA: NEGATIVE
Glucose, UA: NEGATIVE
Ketones, UA: NEGATIVE
Leukocytes, UA: NEGATIVE
Nitrite, UA: NEGATIVE
Protein, UA: NEGATIVE
Spec Grav, UA: 1.025 (ref 1.010–1.025)
Urobilinogen, UA: 0.2 E.U./dL
pH, UA: 5.5 (ref 5.0–8.0)

## 2021-06-04 NOTE — Progress Notes (Signed)
[facility] Urodynamics Procedure  Referring Physician: Lujean Amel, MD Date of Procedure: 06/04/2021  Mary Donovan is a 75 y.o. female who presents for urodynamic evaluation. Indication(s) for study: SUI and OAB  Vital Signs: BP (!) 143/87   Pulse (!) 57   Laboratory Results: A catheterized urine specimen revealed:  POC urine: trace blood  Voiding Diary: She did not complete a bladder diary  Procedure Timeout:  The correct patient was verified and the correct procedure was verified. The patient was in the correct position and safety precautions were reviewed based on at the patient's history.  Urodynamic Procedure A 65F dual lumen urodynamics catheter was placed under sterile conditions into the patient's bladder. A 65F catheter was placed into the rectum in order to measure abdominal pressure. EMG patches were placed in the appropriate position.  All connections were confirmed and calibrations/adjusted made. Saline was instilled into the bladder through the dual lumen catheters.  Cough/valsalva pressures were measured periodically during filling.  Patient was allowed to void.  The bladder was then emptied of its residual.  UROFLOW: She was unable to void.  Had a residual of 1 mL.   CMG: This was performed with sterile water in the sitting position at a fill rate of 20- 30 mL/min.    First sensation of fullness was 85 mLs,  First urge was 93 mLs,  Strong urge was 143 mLs and  Capacity was 350 mLs  Stress incontinence was demonstrated Lowest positive Barrier CLPP was 154 cmH20 at 269 ml. Highest negative Barrier VLPP was 62 cmH20 at 269 ml.  Detrusor function was normal, with no phasic contractions seen.    Compliance:  normal. End fill detrusor pressure was 0cmH20.    UPP: Not performed   MICTURITION STUDY: Voiding was performed with reduction using pessary in the sitting position.  Patient was unable to void. Moved to commode and still could not void.    EMG: This was performed with patches.  She had voluntary contractions, recruitment with fill was present.  The details of the procedure with the study tracings have been scanned into EPIC.   Urodynamic Impression:  1. Sensation was increased; capacity was normal 2. Stress Incontinence was demonstrated at normal pressures; 3. Detrusor Overactivity was not demonstrated. 4. Emptying was dysfunctional - patient unable to void although she states she does not normally have difficulty at home.  Plan: - The patient will follow up  to discuss the findings and treatment options.   [name], MD

## 2021-06-04 NOTE — Patient Instructions (Signed)
Taking Care of Yourself after Urodynamics, Cystoscopy, Coaptite Injection, or Botox Injection   Drink plenty of water for a day or two following your procedure. Try to have about 8 ounces (one cup) at a time, and do this 6 times or more per day unless you have fluid restrictitons AVOID irritative beverages such as coffee, tea, soda, alcoholic or citrus drinks for a day or two, as this may cause burning with urination.  For the first 1-2 days after the procedure, your urine may be pink or red in color. You may have some blood in your urine as a normal side effect of the procedure. Large amounts of bleeding or difficulty urinating are NOT normal. Call the nurse line if this happens or go to the nearest Emergency Room if the bleeding is heavy or you cannot urinate at all and it is after hours. If you had a Coaptite injection in the urethra and need to be catheterized afterward, ask for a pediatric catheter to be used (size 10 or 12-French) so the Coaptite material is not pushed out of place.   You may experience some discomfort or a burning sensation with urination after having this procedure. You can use over the counter Azo or pyridium to help with burning and follow the instructions on the packaging. If it does not improve within 1-2 days, or other symptoms appear (fever, chills, or difficulty urinating) call the office to speak to a nurse.  You may return to normal daily activities such as work, school, driving, exercising and housework on the day of the procedure. If your doctor gave you a prescription, take it as ordered.  If you need a return appointment, the front desk staff will arrange it when you check out.

## 2021-06-24 NOTE — Progress Notes (Signed)
[facility] Return Visit  SUBJECTIVE  History of Present Illness: Mary Donovan is a 75 y.o. female seen in follow-up after urodynamic testing to discuss surgery.   Urodynamic Impression:  1. Sensation was increased; capacity was normal 2. Stress Incontinence was demonstrated at normal pressures; 3. Detrusor Overactivity was not demonstrated. 4. Emptying was dysfunctional - patient unable to void although she states she does not normally have difficulty at home.  Past Medical History: Patient  has a past medical history of Arthritis, Complication of anesthesia, COVID (11/26/2020), Crohn disease ([place]), DM type 2 (diabetes mellitus, type 2) ([place]), Hypercholesterolemia, Hypertension, PONV (postoperative nausea and vomiting), Skin cancer, Uterine prolapse without vaginal wall prolapse, Vaginal Pap smear, abnormal (1978), and Vitamin B 12 deficiency.   Past Surgical History: She  has a past surgical history that includes Breast reduction surgery (1985); Illectomy (02/1998); left foot reconstruction (07/2011 left right dec 2012); Vaginal hysterectomy (1978); right big toe blister  drained (2013); right great toe wound care (2020); Appendectomy (1999); Perineoplasty (N/A, 12/21/2020); Anterior and posterior repair with sacrospinous fixation (N/A, 12/21/2020); Cystocele repair (N/A, 12/21/2020); and Cystoscopy (N/A, 12/21/2020).   Medications: She has a current medication list which includes the following prescription(s): acetaminophen, celecoxib, clotrimazole-betamethasone, contour next test, estradiol, furosemide, gabapentin, glimepiride, metformin, montelukast, OVER THE COUNTER MEDICATION, OVER THE COUNTER MEDICATION, PRESCRIPTION MEDICATION, simvastatin, UNABLE TO FIND, UNABLE TO FIND, and valsartan.   Allergies: Patient is allergic to clindamycin, penicillin g, tape, latex, and neosporin original [bacitracin-neomycin-polymyxin].   Social History: Patient  reports that she has never  smoked. She has never used smokeless tobacco. She reports current alcohol use. She reports that she does not use drugs.      OBJECTIVE     Physical Exam: Vitals:   06/25/21 1135  BP: 135/82  Pulse: 72   Gen: No apparent distress, A&O x 3.  Detailed Urogynecologic Evaluation:  Deferred. Prior exam showed: POP-Q   0.5                                            Aa   0.5                                           Ba   -6                                              C    5                                            Gh   4                                            Pb   7  tvl    -3                                            Ap   -3                                            Bp                                                  D        ASSESSMENT AND PLAN    Mary Donovan is a 75 y.o. with:  1. Prolapse of anterior vaginal wall    No anti-incontinence procedure needed. We reviewed that robotic procedure may not be possible if she has extensive scar tissues from her prior surgery. If this is the case, she prefers vaginal surgery again.   Plan for surgery: Exam under anesthesia, robotic sacrocolpopexy, cystoscopy, possible sacrospinous fixation and anterior repair.   - We reviewed the patient's specific anatomic and functional findings, with the assistance of diagrams, and together finalized the above procedure. The planned surgical procedures were discussed along with the surgical risks outlined below, which were also provided on a detailed handout. Additional treatment options including expectant management, conservative management, medical management were discussed where appropriate.  We reviewed the benefits and risks of each treatment option.   General Surgical Risks: For all procedures, there are risks of bleeding, infection, damage to surrounding organs including but not limited to bowel, bladder, blood vessels, ureters and  nerves, and need for further surgery if an injury were to occur. These risks are all low with minimally invasive surgery.   There are risks of numbness and weakness at any body site or buttock/rectal pain.  It is possible that baseline pain can be worsened by surgery, either with or without mesh. If surgery is vaginal, there is also a low risk of possible conversion to laparoscopy or open abdominal incision where indicated. Very rare risks include blood transfusion, blood clot, heart attack, pneumonia, or death.   There is also a risk of short-term postoperative urinary retention with need to use a catheter. About half of patients need to go home from surgery with a catheter, which is then later removed in the office. The risk of long-term need for a catheter is very low. There is also a risk of worsening of overactive bladder.    Prolapse (with or without mesh): Risk factors for surgical failure  include things that put pressure on your pelvis and the surgical repair, including obesity, chronic cough, and heavy lifting or straining (including lifting children or adults, straining on the toilet, or lifting heavy objects such as furniture or anything weighing >25 lbs. Risks of recurrence is 20-30% with vaginal native tissue repair and a less than 10% with sacrocolpopexy with mesh.    Sacrocolpopexy: Mesh implants may provide more prolapse support, but do have some unique risks to consider. It is important to understand that mesh is permanent and cannot be easily removed. Risks of abdominal sacrocolpopexy mesh include mesh exposure (~3-6%), painful intercourse (recent studies show lower rates after surgery compared  to before, with ~5-8% risk of new onset), and very rare risks of bowel or bladder injury or infection (<1%). The risk of mesh exposure is more likely in a woman with risks for poor healing (prior radiation, poorly controlled diabetes, or immunocompromised). The risk of new or worsened chronic  pain after mesh implant is more common in women with baseline chronic pain and/or poorly controlled anxiety or depression. There is an FDA safety notification on vaginal mesh procedures for prolapse but NOT abdominal mesh procedures and therefore does not apply to your surgery. We have extensive experience and training with mesh placement and we have close postoperative follow up to identify any potential complications from mesh.    - For preop Visit:  She is required to have a visit within 30 days of her surgery.   Today we reviewed pre-operative preparation, peri-operative expectations, and post-operative instructions/recovery.  She was provided with instructional handouts. She understands not to take aspirin (>15m) or NSAIDs 7 days prior to surgery. She does not want prescriptions- she has medication from last surgery that she did not take.   - Medical clearance: Not required- had medical clearance obtained from last surgery in Feb and she was optimized. Will need updated A1c.  - Anticoagulant use: No - Medicaid Hysterectomy form: No - Accepts blood transfusion: Yes - Expected length of stay: outpatient  Request sent for surgery scheduling.   MJaquita Folds MD  Time spent: I spent 30 minutes dedicated to the care of this patient on the date of this encounter to include pre-visit review of records, face-to-face time with the patient discussing surgical options and post visit documentation.

## 2021-06-25 ENCOUNTER — Ambulatory Visit (INDEPENDENT_AMBULATORY_CARE_PROVIDER_SITE_OTHER): Payer: Medicare Other | Admitting: Obstetrics and Gynecology

## 2021-06-25 ENCOUNTER — Other Ambulatory Visit: Payer: Self-pay

## 2021-06-25 ENCOUNTER — Encounter: Payer: Self-pay | Admitting: Obstetrics and Gynecology

## 2021-06-25 VITALS — BP 135/82 | HR 72

## 2021-06-25 DIAGNOSIS — N8110 Cystocele, unspecified: Secondary | ICD-10-CM

## 2021-06-30 DIAGNOSIS — Z79899 Other long term (current) drug therapy: Secondary | ICD-10-CM | POA: Diagnosis not present

## 2021-06-30 DIAGNOSIS — E538 Deficiency of other specified B group vitamins: Secondary | ICD-10-CM | POA: Diagnosis not present

## 2021-06-30 DIAGNOSIS — E1169 Type 2 diabetes mellitus with other specified complication: Secondary | ICD-10-CM | POA: Diagnosis not present

## 2021-07-02 ENCOUNTER — Encounter: Payer: Self-pay | Admitting: Obstetrics and Gynecology

## 2021-07-08 DIAGNOSIS — D485 Neoplasm of uncertain behavior of skin: Secondary | ICD-10-CM | POA: Diagnosis not present

## 2021-07-08 DIAGNOSIS — Z85828 Personal history of other malignant neoplasm of skin: Secondary | ICD-10-CM | POA: Diagnosis not present

## 2021-07-08 DIAGNOSIS — Z08 Encounter for follow-up examination after completed treatment for malignant neoplasm: Secondary | ICD-10-CM | POA: Diagnosis not present

## 2021-07-08 DIAGNOSIS — C44319 Basal cell carcinoma of skin of other parts of face: Secondary | ICD-10-CM | POA: Diagnosis not present

## 2021-07-08 DIAGNOSIS — C44619 Basal cell carcinoma of skin of left upper limb, including shoulder: Secondary | ICD-10-CM | POA: Diagnosis not present

## 2021-07-08 DIAGNOSIS — D235 Other benign neoplasm of skin of trunk: Secondary | ICD-10-CM | POA: Diagnosis not present

## 2021-07-08 DIAGNOSIS — D229 Melanocytic nevi, unspecified: Secondary | ICD-10-CM | POA: Diagnosis not present

## 2021-07-08 DIAGNOSIS — L821 Other seborrheic keratosis: Secondary | ICD-10-CM | POA: Diagnosis not present

## 2021-07-08 DIAGNOSIS — C44612 Basal cell carcinoma of skin of right upper limb, including shoulder: Secondary | ICD-10-CM | POA: Diagnosis not present

## 2021-07-12 DIAGNOSIS — E1169 Type 2 diabetes mellitus with other specified complication: Secondary | ICD-10-CM | POA: Diagnosis not present

## 2021-07-12 DIAGNOSIS — G8929 Other chronic pain: Secondary | ICD-10-CM | POA: Diagnosis not present

## 2021-07-12 DIAGNOSIS — E538 Deficiency of other specified B group vitamins: Secondary | ICD-10-CM | POA: Diagnosis not present

## 2021-07-12 DIAGNOSIS — E7800 Pure hypercholesterolemia, unspecified: Secondary | ICD-10-CM | POA: Diagnosis not present

## 2021-07-12 DIAGNOSIS — I10 Essential (primary) hypertension: Secondary | ICD-10-CM | POA: Diagnosis not present

## 2021-07-12 DIAGNOSIS — G4700 Insomnia, unspecified: Secondary | ICD-10-CM | POA: Diagnosis not present

## 2021-07-15 ENCOUNTER — Encounter: Payer: Self-pay | Admitting: Obstetrics and Gynecology

## 2021-07-20 ENCOUNTER — Other Ambulatory Visit: Payer: Self-pay

## 2021-07-20 ENCOUNTER — Encounter (HOSPITAL_BASED_OUTPATIENT_CLINIC_OR_DEPARTMENT_OTHER): Payer: Self-pay | Admitting: Obstetrics and Gynecology

## 2021-07-20 NOTE — Progress Notes (Signed)
Spoke w/ via phone for pre-op interview---pt Lab needs dos----   none            Lab results------lab appt 07-23-2021 1030 am for cbc bmp t & s hemaglobin a1c, has ekg 12-21-2020 epic, lov neurology dr Randall [facility] 05-28-2021 care everywhere COVID test -----20-04-2021 extended recovery Arrive at -------530 am 07-27-2021 NPO after MN NO Solid Food.  Clear liquids from MN until---430 am Med rec completed Medications to take morning of surgery -----immodium prn Diabetic medication -----none morning of sugrery Patient instructed no nail polish to be worn day of surgery Patient instructed to bring photo id and insurance card day of surgery Patient aware to have Driver (ride ) / caregiver  son patrick   for 24 hours after surgery  Patient Special Instructions -----pt given extended stay instructions Pre-Op special Istructions -----none Patient verbalized understanding of instructions that were given at this phone interview. Patient denies shortness of breath, chest pain, fever, cough at this phone interview.

## 2021-07-21 ENCOUNTER — Encounter: Payer: Self-pay | Admitting: Obstetrics and Gynecology

## 2021-07-21 ENCOUNTER — Ambulatory Visit (INDEPENDENT_AMBULATORY_CARE_PROVIDER_SITE_OTHER): Payer: Medicare Other | Admitting: Obstetrics and Gynecology

## 2021-07-21 DIAGNOSIS — N8110 Cystocele, unspecified: Secondary | ICD-10-CM

## 2021-07-21 DIAGNOSIS — Z01818 Encounter for other preprocedural examination: Secondary | ICD-10-CM

## 2021-07-21 NOTE — H&P (Signed)
[facility] Pre-Operative H&P  Subjective Chief Complaint: Mary Donovan presents for a preoperative encounter.   History of Present Illness: Mary Donovan is a 75 y.o. female who presents for preoperative visit.  She is scheduled to undergo Exam under anesthesia, robotic sacrocolpopexy, cystoscopy, possible sacrospinous fixation and anterior repair on 07/27/21.  Her symptoms include vaginal bulge, and she was was found to have Stage II anterior, Stage I posterior, Stage I apical prolapse .  Past Medical History:  Diagnosis Date   Arthritis    Complication of anesthesia    COVID 11/26/2020   asymptomatic   Crohn disease ([place])    DM type 2 (diabetes mellitus, type 2) ([place])    Hypercholesterolemia    Hypertension    PONV (postoperative nausea and vomiting)    slowo to wake up   Skin cancer    forehead and face basal cell carconoma removed   Uterine prolapse without vaginal wall prolapse    Vaginal Pap smear, abnormal 1978   hysterectomy   Vitamin B 12 deficiency      Past Surgical History:  Procedure Laterality Date   ANTERIOR AND POSTERIOR REPAIR WITH SACROSPINOUS FIXATION N/A 12/21/2020   Procedure: SACROSPINOUS FIXATION;;  Surgeon: [name], MD;  Location: [facility];  Service: Gynecology;  Laterality: N/A;  total time of procedure 120 min; anterior repair only, no posterior   APPENDECTOMY  1999   with colon surgery   BREAST REDUCTION SURGERY  1985   CYSTOCELE REPAIR N/A 12/21/2020   Procedure: ANTERIOR REPAIR (CYSTOCELE);  Surgeon: [name], MD;  Location[facility];  Service: Gynecology;  Laterality: N/A;   CYSTOSCOPY N/A 12/21/2020   Procedure: CYSTOSCOPY;  Surgeon: [name], MD;  Location: [facility];  Service: Gynecology;  Laterality: N/A;   Illectomy  02/1998   left foot reconstruction  07/2011 left right dec 2012   PERINEOPLASTY N/A 12/21/2020   Procedure: PERINEOPLASTY;   Surgeon: [name], MD;  Location: [facility];  Service: Gynecology;  Laterality: N/A;   right big toe blister  drained  2013   wound caree done   right great toe wound care  2020   VAGINAL HYSTERECTOMY  1978   partial    is allergic to clindamycin, penicillin g, tape, latex, and neosporin original [bacitracin-neomycin-polymyxin].   Family History  Problem Relation Age of Onset   Cancer Mother    Diabetes Other    Cancer Paternal Aunt    Breast cancer Paternal Aunt    Cancer Daughter    Hypertension Neg Hx     Social History   Tobacco Use   Smoking status: Never   Smokeless tobacco: Never  Vaping Use   Vaping Use: Never used  Substance Use Topics   Alcohol use: Yes    Comment: rare   Drug use: Never     Review of Systems was negative for a full 10 system review except as noted in the History of Present Illness.  No current facility-administered medications for this encounter.  Current Outpatient Medications:    acetaminophen (TYLENOL) 325 MG tablet, Take 650 mg by mouth at bedtime., Disp: , Rfl:    celecoxib (CELEBREX) 200 MG capsule, TAKE 1 CAPSULE EVERY DAY WITH FOOD, Disp: , Rfl:    CONTOUR NEXT TEST test strip, daily., Disp: , Rfl:    estradiol (ESTRACE) 0.1 MG/GM vaginal cream, Place 0.5 g vaginally 2 (two) times a week. Place  0.5g nightly for two weeks then twice a week after, Disp: 30 g, Rfl: 11   furosemide (LASIX) 20 MG tablet, Take 20 mg by mouth daily., Disp: , Rfl:    gabapentin (NEURONTIN) 300 MG capsule, Take 300 mg by mouth at bedtime., Disp: , Rfl:    glimepiride (AMARYL) 1 MG tablet, Take 1 mg by mouth daily., Disp: , Rfl:    metFORMIN (GLUCOPHAGE) 500 MG tablet, Take 1,000 mg by mouth 2 (two) times daily., Disp: , Rfl:    montelukast (SINGULAIR) 10 MG tablet, Take 10 mg by mouth at bedtime., Disp: , Rfl:    OVER THE COUNTER MEDICATION, 1600 iu vitamin d daily, Disp: , Rfl:    OVER THE COUNTER MEDICATION, Immodium prn 1 to  2 tabs, Disp: , Rfl:    PRESCRIPTION MEDICATION, Vitamin b 12 shot q month, Disp: , Rfl:    simvastatin (ZOCOR) 20 MG tablet, Take 20 mg by mouth daily., Disp: , Rfl:    UNABLE TO FIND, Med Name:calcium 600 mg bid, Disp: , Rfl:    UNABLE TO FIND, Med Name: magnesium 250  mg bid, Disp: , Rfl:    valsartan (DIOVAN) 80 MG tablet, Take 80 mg by mouth daily., Disp: , Rfl:    Objective Gen: No apparent distress, A&O x 3.   Detailed Urogynecologic Evaluation:  Deferred. Prior exam showed:   POP-Q   0.5                                            Aa   0.5                                           Ba   -6                                              C    5                                            Gh   4                                            Pb   7                                            tvl    -3                                            Ap   -3  Bp                                                  D            Assessment/ Plan  \ The patient is a 75 y.o. year old scheduled to undergo Exam under anesthesia, robotic sacrocolpopexy, cystoscopy, possible sacrospinous fixation and anterior repair.     [name], MD

## 2021-07-21 NOTE — Progress Notes (Signed)
[facility] Pre-Operative H&P  Subjective Chief Complaint: Mary Donovan presents for a preoperative encounter.   History of Present Illness: Mary Donovan is a 75 y.o. female who presents for preoperative visit.  She is scheduled to undergo Exam under anesthesia, robotic sacrocolpopexy, cystoscopy, possible sacrospinous fixation and anterior repair on 07/27/21.  Her symptoms include vaginal bulge, and she was was found to have Stage II anterior, Stage I posterior, Stage I apical prolapse .  Past Medical History:  Diagnosis Date   Arthritis    Complication of anesthesia    COVID 11/26/2020   asymptomatic   Crohn disease ([place])    DM type 2 (diabetes mellitus, type 2) ([place])    Hypercholesterolemia    Hypertension    PONV (postoperative nausea and vomiting)    slowo to wake up   Skin cancer    forehead and face basal cell carconoma removed   Uterine prolapse without vaginal wall prolapse    Vaginal Pap smear, abnormal 1978   hysterectomy   Vitamin B 12 deficiency      Past Surgical History:  Procedure Laterality Date   ANTERIOR AND POSTERIOR REPAIR WITH SACROSPINOUS FIXATION N/A 12/21/2020   Procedure: SACROSPINOUS FIXATION;;  Surgeon: [name], MD;  Location: [facility];  Service: Gynecology;  Laterality: N/A;  total time of procedure 120 min; anterior repair only, no posterior   APPENDECTOMY  1999   with colon surgery   BREAST REDUCTION SURGERY  1985   CYSTOCELE REPAIR N/A 12/21/2020   Procedure: ANTERIOR REPAIR (CYSTOCELE);  Surgeon: [name], MD;  Location: [facility];  Service: Gynecology;  Laterality: N/A;   CYSTOSCOPY N/A 12/21/2020   Procedure: CYSTOSCOPY;  Surgeon: [name], MD;  Location: [facility];  Service: Gynecology;  Laterality: N/A;   Illectomy  02/1998   left foot reconstruction  07/2011 left right dec 2012   PERINEOPLASTY N/A 12/21/2020   Procedure: PERINEOPLASTY;   Surgeon: [name], MD;  Location: [facility];  Service: Gynecology;  Laterality: N/A;   right big toe blister  drained  2013   wound caree done   right great toe wound care  2020   VAGINAL HYSTERECTOMY  1978   partial    is allergic to clindamycin, penicillin g, tape, latex, and neosporin original [bacitracin-neomycin-polymyxin].   Family History  Problem Relation Age of Onset   Cancer Mother    Diabetes Other    Cancer Paternal Aunt    Breast cancer Paternal Aunt    Cancer Daughter    Hypertension Neg Hx     Social History   Tobacco Use   Smoking status: Never   Smokeless tobacco: Never  Vaping Use   Vaping Use: Never used  Substance Use Topics   Alcohol use: Yes    Comment: rare   Drug use: Never     Review of Systems was negative for a full 10 system review except as noted in the History of Present Illness.   Current Outpatient Medications:    acetaminophen (TYLENOL) 325 MG tablet, Take 650 mg by mouth at bedtime., Disp: , Rfl:    celecoxib (CELEBREX) 200 MG capsule, TAKE 1 CAPSULE EVERY DAY WITH FOOD, Disp: , Rfl:    CONTOUR NEXT TEST test strip, daily., Disp: , Rfl:    estradiol (ESTRACE) 0.1 MG/GM vaginal cream, Place 0.5 g vaginally 2 (two) times a week. Place 0.5g nightly for two weeks then twice  a week after, Disp: 30 g, Rfl: 11   furosemide (LASIX) 20 MG tablet, Take 20 mg by mouth daily., Disp: , Rfl:    gabapentin (NEURONTIN) 300 MG capsule, Take 300 mg by mouth at bedtime., Disp: , Rfl:    glimepiride (AMARYL) 1 MG tablet, Take 1 mg by mouth daily., Disp: , Rfl:    metFORMIN (GLUCOPHAGE) 500 MG tablet, Take 1,000 mg by mouth 2 (two) times daily., Disp: , Rfl:    montelukast (SINGULAIR) 10 MG tablet, Take 10 mg by mouth at bedtime., Disp: , Rfl:    OVER THE COUNTER MEDICATION, 1600 iu vitamin d daily, Disp: , Rfl:    OVER THE COUNTER MEDICATION, Immodium prn 1 to 2 tabs, Disp: , Rfl:    PRESCRIPTION MEDICATION, Vitamin b 12  shot q month, Disp: , Rfl:    simvastatin (ZOCOR) 20 MG tablet, Take 20 mg by mouth daily., Disp: , Rfl:    UNABLE TO FIND, Med Name:calcium 600 mg bid, Disp: , Rfl:    UNABLE TO FIND, Med Name: magnesium 250  mg bid, Disp: , Rfl:    valsartan (DIOVAN) 80 MG tablet, Take 80 mg by mouth daily., Disp: , Rfl:    Objective Gen: No apparent distress, A&O x 3.   Detailed Urogynecologic Evaluation:  Deferred. Prior exam showed:   POP-Q   0.5                                            Aa   0.5                                           Ba   -6                                              C    5                                            Gh   4                                            Pb   7                                            tvl    -3                                            Ap   -3  Bp                                                  D            Assessment/ Plan  Assessment: The patient is a 75 y.o. year old scheduled to undergo Exam under anesthesia, robotic sacrocolpopexy, cystoscopy, possible sacrospinous fixation and anterior repair. . Verbal consent was obtained for these procedures.  Plan:  Pre-operative instructions:  She was instructed to not take Aspirin/NSAIDs x 7days prior to surgery. Antibiotic prophylaxis was ordered as indicated.  Cathter use: Patient will go home with foley if needed after post-operative voiding trial.  Post-operative instructions:  She was provided with specific post-operative instructions, including precautions and signs/symptoms for which we would recommend contacting us, in addition to daytime and after-hours contact phone numbers. This was provided on a handout.   Post-operative medications: She already has medications from her last surgery so new prescriptions will not be written. Has ibuprofen, tylenol and oxycodone.   Laboratory testing:  We will check labs: CBC, BMP, and type  and screen. Last A1c was 7 on faxed paperwork/   We also discussed that Covid testing will take place 2 days prior to her surgery and will get cancelled if she tests positive.    Preoperative clearance:  She does not require surgical clearance- previously cleared with prior surgery.     Post-operative follow-up:  A post-operative appointment will be made for 6 weeks from the date of surgery. If she needs a post-operative nurse visit for a voiding trial, that will be set up after she leaves the hospital.    Patient will call the clinic or use MyChart should anything change or any new issues arise.   [name], MD

## 2021-07-23 ENCOUNTER — Other Ambulatory Visit: Payer: Self-pay | Admitting: Obstetrics and Gynecology

## 2021-07-23 ENCOUNTER — Other Ambulatory Visit: Payer: Self-pay

## 2021-07-23 ENCOUNTER — Encounter (HOSPITAL_COMMUNITY)
Admission: RE | Admit: 2021-07-23 | Discharge: 2021-07-23 | Disposition: A | Discharge status: Home / Self Care | Payer: Medicare Other | Source: Ambulatory Visit | Attending: Obstetrics and Gynecology | Admitting: Obstetrics and Gynecology

## 2021-07-23 DIAGNOSIS — Z01812 Encounter for preprocedural laboratory examination: Secondary | ICD-10-CM | POA: Insufficient documentation

## 2021-07-23 DIAGNOSIS — E119 Type 2 diabetes mellitus without complications: Secondary | ICD-10-CM | POA: Insufficient documentation

## 2021-07-23 LAB — BASIC METABOLIC PANEL
Anion gap: 10 (ref 5–15)
BUN: 22 mg/dL (ref 8–23)
CO2: 27 mmol/L (ref 22–32)
Calcium: 9.8 mg/dL (ref 8.9–10.3)
Chloride: 108 mmol/L (ref 98–111)
Creatinine, Ser: 0.57 mg/dL (ref 0.44–1.00)
GFR, Estimated: >60 mL/min (ref >60)
Glucose, Bld: 127 mg/dL — ABNORMAL HIGH (ref 70–99)
Potassium: 4.3 mmol/L (ref 3.5–5.1)
Sodium: 145 mmol/L (ref 135–145)

## 2021-07-23 LAB — CBC
HCT: 39.6 % (ref 36.0–46.0)
Hemoglobin: 12.7 g/dL (ref 12.0–15.0)
MCH: 27.7 pg (ref 26.0–34.0)
MCHC: 32.1 g/dL (ref 30.0–36.0)
MCV: 86.5 fL (ref 80.0–100.0)
Platelets: 302 10*3/uL (ref 150–400)
RBC: 4.58 MIL/uL (ref 3.87–5.11)
RDW: 14 % (ref 11.5–15.5)
WBC: 7 10*3/uL (ref 4.0–10.5)
nRBC: 0 % (ref 0.0–0.2)

## 2021-07-23 LAB — HEMOGLOBIN A1C
Hgb A1c MFr Bld: 6.8 % — ABNORMAL HIGH (ref 4.8–5.6)
Mean Plasma Glucose: 148.46 mg/dL

## 2021-07-23 LAB — SARS CORONAVIRUS 2 (TAT 6-24 HRS): SARS Coronavirus 2: NEGATIVE

## 2021-07-26 MED ORDER — GENTAMICIN SULFATE 40 MG/ML IJ SOLN
5.0000 mg/kg | INTRAVENOUS | Status: AC
  Administered 2021-07-27: 380 mg via INTRAVENOUS
  Filled 2021-07-26: qty 9.5

## 2021-07-26 MED ORDER — METRONIDAZOLE 500 MG/100ML IV SOLN
500.0000 mg | INTRAVENOUS | Status: AC
  Administered 2021-07-27: 500 mg via INTRAVENOUS

## 2021-07-27 ENCOUNTER — Inpatient Hospital Stay (HOSPITAL_BASED_OUTPATIENT_CLINIC_OR_DEPARTMENT_OTHER)
Admission: RE | Admit: 2021-07-27 | Discharge: 2021-07-28 | DRG: 748 | Disposition: A | Discharge status: Home / Self Care | Payer: Medicare Other | Attending: Obstetrics and Gynecology | Admitting: Obstetrics and Gynecology

## 2021-07-27 ENCOUNTER — Encounter (HOSPITAL_BASED_OUTPATIENT_CLINIC_OR_DEPARTMENT_OTHER)
Admission: RE | Disposition: A | Discharge status: Home / Self Care | Payer: Self-pay | Source: Home / Self Care | Attending: Obstetrics and Gynecology

## 2021-07-27 ENCOUNTER — Ambulatory Visit (HOSPITAL_BASED_OUTPATIENT_CLINIC_OR_DEPARTMENT_OTHER): Payer: Medicare Other | Admitting: Anesthesiology

## 2021-07-27 ENCOUNTER — Encounter (HOSPITAL_BASED_OUTPATIENT_CLINIC_OR_DEPARTMENT_OTHER): Payer: Self-pay | Admitting: Obstetrics and Gynecology

## 2021-07-27 DIAGNOSIS — M1990 Unspecified osteoarthritis, unspecified site: Secondary | ICD-10-CM | POA: Diagnosis present

## 2021-07-27 DIAGNOSIS — Z803 Family history of malignant neoplasm of breast: Secondary | ICD-10-CM

## 2021-07-27 DIAGNOSIS — Z79899 Other long term (current) drug therapy: Secondary | ICD-10-CM

## 2021-07-27 DIAGNOSIS — K5090 Crohn's disease, unspecified, without complications: Secondary | ICD-10-CM | POA: Diagnosis not present

## 2021-07-27 DIAGNOSIS — E119 Type 2 diabetes mellitus without complications: Secondary | ICD-10-CM | POA: Diagnosis present

## 2021-07-27 DIAGNOSIS — Z85828 Personal history of other malignant neoplasm of skin: Secondary | ICD-10-CM

## 2021-07-27 DIAGNOSIS — Z791 Long term (current) use of non-steroidal anti-inflammatories (NSAID): Secondary | ICD-10-CM

## 2021-07-27 DIAGNOSIS — N736 Female pelvic peritoneal adhesions (postinfective): Secondary | ICD-10-CM | POA: Diagnosis present

## 2021-07-27 DIAGNOSIS — I10 Essential (primary) hypertension: Secondary | ICD-10-CM | POA: Diagnosis present

## 2021-07-27 DIAGNOSIS — E7800 Pure hypercholesterolemia, unspecified: Secondary | ICD-10-CM | POA: Diagnosis present

## 2021-07-27 DIAGNOSIS — Z8616 Personal history of COVID-19: Secondary | ICD-10-CM

## 2021-07-27 DIAGNOSIS — Z881 Allergy status to other antibiotic agents status: Secondary | ICD-10-CM

## 2021-07-27 DIAGNOSIS — Z90710 Acquired absence of both cervix and uterus: Secondary | ICD-10-CM

## 2021-07-27 DIAGNOSIS — Z7984 Long term (current) use of oral hypoglycemic drugs: Secondary | ICD-10-CM

## 2021-07-27 DIAGNOSIS — K660 Peritoneal adhesions (postprocedural) (postinfection): Secondary | ICD-10-CM | POA: Diagnosis not present

## 2021-07-27 DIAGNOSIS — R42 Dizziness and giddiness: Secondary | ICD-10-CM | POA: Diagnosis not present

## 2021-07-27 DIAGNOSIS — Z833 Family history of diabetes mellitus: Secondary | ICD-10-CM | POA: Diagnosis not present

## 2021-07-27 DIAGNOSIS — N993 Prolapse of vaginal vault after hysterectomy: Principal | ICD-10-CM | POA: Diagnosis present

## 2021-07-27 DIAGNOSIS — N8110 Cystocele, unspecified: Secondary | ICD-10-CM | POA: Diagnosis present

## 2021-07-27 DIAGNOSIS — E538 Deficiency of other specified B group vitamins: Secondary | ICD-10-CM | POA: Diagnosis present

## 2021-07-27 HISTORY — PX: ROBOTIC ASSISTED LAPAROSCOPIC SACROCOLPOPEXY: SHX5388

## 2021-07-27 HISTORY — PX: CYSTOSCOPY: SHX5120

## 2021-07-27 HISTORY — PX: ROBOTIC ASSISTED LAPAROSCOPIC LYSIS OF ADHESION: SHX6080

## 2021-07-27 LAB — GLUCOSE, CAPILLARY
Glucose-Capillary: 135 mg/dL — ABNORMAL HIGH (ref 70–99)
Glucose-Capillary: 184 mg/dL — ABNORMAL HIGH (ref 70–99)
Glucose-Capillary: 286 mg/dL — ABNORMAL HIGH (ref 70–99)
Glucose-Capillary: 189 mg/dL — ABNORMAL HIGH (ref 70–99)

## 2021-07-27 LAB — TYPE AND SCREEN
ABO/RH(D): O POS
Antibody Screen: NEGATIVE

## 2021-07-27 LAB — ABO/RH: ABO/RH(D): O POS

## 2021-07-27 SURGERY — SACROCOLPOPEXY, ROBOT-ASSISTED, LAPAROSCOPIC
Anesthesia: General | Site: Urethra

## 2021-07-27 MED ORDER — LIDOCAINE 2% (20 MG/ML) 5 ML SYRINGE
INTRAMUSCULAR | Status: AC
  Filled 2021-07-27: qty 5

## 2021-07-27 MED ORDER — ONDANSETRON HCL 4 MG/2ML IJ SOLN
INTRAMUSCULAR | Status: DC | PRN
  Administered 2021-07-27: 4 mg via INTRAVENOUS

## 2021-07-27 MED ORDER — FENTANYL CITRATE (PF) 100 MCG/2ML IJ SOLN
INTRAMUSCULAR | Status: AC
  Filled 2021-07-27: qty 2

## 2021-07-27 MED ORDER — OXYCODONE-ACETAMINOPHEN 5-325 MG PO TABS
1.0000 | ORAL_TABLET | ORAL | Status: DC | PRN
  Administered 2021-07-27: 2 via ORAL

## 2021-07-27 MED ORDER — FENTANYL CITRATE (PF) 100 MCG/2ML IJ SOLN
25.0000 ug | INTRAMUSCULAR | Status: DC | PRN
Start: 2021-07-27 — End: 2021-07-27
  Administered 2021-07-27: 50 ug via INTRAVENOUS

## 2021-07-27 MED ORDER — ACETAMINOPHEN 325 MG PO TABS
ORAL_TABLET | ORAL | Status: AC
  Filled 2021-07-27: qty 2

## 2021-07-27 MED ORDER — ROCURONIUM BROMIDE 10 MG/ML (PF) SYRINGE
PREFILLED_SYRINGE | INTRAVENOUS | Status: AC
  Filled 2021-07-27: qty 10

## 2021-07-27 MED ORDER — DEXAMETHASONE SODIUM PHOSPHATE 10 MG/ML IJ SOLN
INTRAMUSCULAR | Status: AC
  Filled 2021-07-27: qty 1

## 2021-07-27 MED ORDER — 0.9 % SODIUM CHLORIDE (POUR BTL) OPTIME
TOPICAL | Status: DC | PRN
  Administered 2021-07-27: 500 mL

## 2021-07-27 MED ORDER — OXYCODONE-ACETAMINOPHEN 5-325 MG PO TABS
ORAL_TABLET | ORAL | Status: AC
  Filled 2021-07-27: qty 2

## 2021-07-27 MED ORDER — ONDANSETRON HCL 4 MG/2ML IJ SOLN: 4.0000 mg | Freq: Four times a day (QID) | INTRAMUSCULAR | Status: DC | PRN

## 2021-07-27 MED ORDER — KETOROLAC TROMETHAMINE 30 MG/ML IJ SOLN
INTRAMUSCULAR | Status: AC
  Filled 2021-07-27: qty 1

## 2021-07-27 MED ORDER — SIMVASTATIN 20 MG PO TABS
20.0000 mg | ORAL_TABLET | Freq: Every day | ORAL | Status: DC
  Filled 2021-07-27: qty 1

## 2021-07-27 MED ORDER — ACETAMINOPHEN 160 MG/5ML PO SOLN: 325.0000 mg | ORAL | Status: DC | PRN

## 2021-07-27 MED ORDER — ACETAMINOPHEN 325 MG PO TABS
325.0000 mg | ORAL_TABLET | ORAL | Status: DC | PRN
  Administered 2021-07-27: 650 mg via ORAL

## 2021-07-27 MED ORDER — LACTATED RINGERS IV SOLN: INTRAVENOUS | Status: DC

## 2021-07-27 MED ORDER — MONTELUKAST SODIUM 10 MG PO TABS
10.0000 mg | ORAL_TABLET | Freq: Every day | ORAL | Status: DC
  Administered 2021-07-27: 10 mg via ORAL
  Filled 2021-07-27 (×2): qty 1

## 2021-07-27 MED ORDER — PROPOFOL 10 MG/ML IV BOLUS
INTRAVENOUS | Status: DC | PRN
  Administered 2021-07-27: 120 mg via INTRAVENOUS
  Administered 2021-07-27: 30 mg via INTRAVENOUS

## 2021-07-27 MED ORDER — SODIUM CHLORIDE 0.9 % IR SOLN
Status: DC | PRN
  Administered 2021-07-27: 1000 mL

## 2021-07-27 MED ORDER — METRONIDAZOLE 500 MG/100ML IV SOLN
INTRAVENOUS | Status: AC
  Filled 2021-07-27: qty 100

## 2021-07-27 MED ORDER — PHENAZOPYRIDINE HCL 100 MG PO TABS
ORAL_TABLET | ORAL | Status: AC
  Filled 2021-07-27: qty 2

## 2021-07-27 MED ORDER — KETOROLAC TROMETHAMINE 30 MG/ML IJ SOLN
30.0000 mg | Freq: Once | INTRAMUSCULAR | Status: AC
  Administered 2021-07-27: 30 mg via INTRAVENOUS

## 2021-07-27 MED ORDER — GABAPENTIN 300 MG PO CAPS
ORAL_CAPSULE | ORAL | Status: AC
  Filled 2021-07-27: qty 1

## 2021-07-27 MED ORDER — CELECOXIB 200 MG PO CAPS
200.0000 mg | ORAL_CAPSULE | Freq: Every day | ORAL | Status: DC
  Administered 2021-07-27: 200 mg via ORAL

## 2021-07-27 MED ORDER — PROMETHAZINE HCL 25 MG/ML IJ SOLN: 6.2500 mg | INTRAMUSCULAR | Status: DC | PRN

## 2021-07-27 MED ORDER — CELECOXIB 200 MG PO CAPS
ORAL_CAPSULE | ORAL | Status: AC
  Filled 2021-07-27: qty 1

## 2021-07-27 MED ORDER — IRBESARTAN 75 MG PO TABS
75.0000 mg | ORAL_TABLET | Freq: Every day | ORAL | Status: DC
  Filled 2021-07-27: qty 1

## 2021-07-27 MED ORDER — ROCURONIUM BROMIDE 10 MG/ML (PF) SYRINGE
PREFILLED_SYRINGE | INTRAVENOUS | Status: DC | PRN
  Administered 2021-07-27: 20 mg via INTRAVENOUS
  Administered 2021-07-27: 50 mg via INTRAVENOUS
  Administered 2021-07-27: 20 mg via INTRAVENOUS

## 2021-07-27 MED ORDER — PROPOFOL 10 MG/ML IV BOLUS
INTRAVENOUS | Status: AC
  Filled 2021-07-27: qty 40

## 2021-07-27 MED ORDER — SUGAMMADEX SODIUM 200 MG/2ML IV SOLN
INTRAVENOUS | Status: DC | PRN
  Administered 2021-07-27: 150 mg via INTRAVENOUS

## 2021-07-27 MED ORDER — GLIMEPIRIDE 1 MG PO TABS
1.0000 mg | ORAL_TABLET | Freq: Every day | ORAL | Status: DC
  Administered 2021-07-28: 1 mg via ORAL
  Filled 2021-07-27 (×2): qty 1

## 2021-07-27 MED ORDER — POVIDONE-IODINE 10 % EX SWAB: 2.0000 "application " | Freq: Once | CUTANEOUS | Status: DC

## 2021-07-27 MED ORDER — OXYCODONE HCL 5 MG/5ML PO SOLN: 5.0000 mg | Freq: Once | ORAL | Status: DC | PRN

## 2021-07-27 MED ORDER — FENTANYL CITRATE (PF) 250 MCG/5ML IJ SOLN
INTRAMUSCULAR | Status: AC
  Filled 2021-07-27: qty 5

## 2021-07-27 MED ORDER — OXYCODONE HCL 5 MG PO TABS: 5.0000 mg | ORAL_TABLET | Freq: Once | ORAL | Status: DC | PRN

## 2021-07-27 MED ORDER — ONDANSETRON HCL 4 MG/2ML IJ SOLN
INTRAMUSCULAR | Status: AC
  Filled 2021-07-27: qty 2

## 2021-07-27 MED ORDER — AMISULPRIDE (ANTIEMETIC) 5 MG/2ML IV SOLN
10.0000 mg | Freq: Once | INTRAVENOUS | Status: AC | PRN
  Administered 2021-07-27: 10 mg via INTRAVENOUS

## 2021-07-27 MED ORDER — FUROSEMIDE 20 MG PO TABS
20.0000 mg | ORAL_TABLET | Freq: Every day | ORAL | Status: DC
  Administered 2021-07-28: 20 mg via ORAL
  Filled 2021-07-27 (×2): qty 1

## 2021-07-27 MED ORDER — FENTANYL CITRATE (PF) 100 MCG/2ML IJ SOLN
INTRAMUSCULAR | Status: DC | PRN
  Administered 2021-07-27: 75 ug via INTRAVENOUS
  Administered 2021-07-27: 50 ug via INTRAVENOUS
  Administered 2021-07-27: 75 ug via INTRAVENOUS

## 2021-07-27 MED ORDER — GABAPENTIN 300 MG PO CAPS
300.0000 mg | ORAL_CAPSULE | Freq: Every day | ORAL | Status: DC
  Administered 2021-07-27: 300 mg via ORAL

## 2021-07-27 MED ORDER — LABETALOL HCL 5 MG/ML IV SOLN: 5.0000 mg | INTRAVENOUS | Status: DC | PRN

## 2021-07-27 MED ORDER — ONDANSETRON HCL 4 MG PO TABS: 4.0000 mg | ORAL_TABLET | Freq: Four times a day (QID) | ORAL | Status: DC | PRN

## 2021-07-27 MED ORDER — ACETAMINOPHEN 325 MG PO TABS: 650.0000 mg | ORAL_TABLET | ORAL | Status: DC | PRN

## 2021-07-27 MED ORDER — DEXAMETHASONE SODIUM PHOSPHATE 10 MG/ML IJ SOLN
INTRAMUSCULAR | Status: DC | PRN
  Administered 2021-07-27: 5 mg via INTRAVENOUS

## 2021-07-27 MED ORDER — ACETAMINOPHEN 10 MG/ML IV SOLN
INTRAVENOUS | Status: AC
  Filled 2021-07-27: qty 100

## 2021-07-27 MED ORDER — BUPIVACAINE HCL (PF) 0.25 % IJ SOLN
INTRAMUSCULAR | Status: DC | PRN
  Administered 2021-07-27: 15 mL

## 2021-07-27 MED ORDER — METFORMIN HCL 500 MG PO TABS
1000.0000 mg | ORAL_TABLET | Freq: Two times a day (BID) | ORAL | Status: DC
  Administered 2021-07-28: 1000 mg via ORAL
  Filled 2021-07-27 (×2): qty 2

## 2021-07-27 MED ORDER — AMISULPRIDE (ANTIEMETIC) 5 MG/2ML IV SOLN
INTRAVENOUS | Status: AC
  Filled 2021-07-27: qty 4

## 2021-07-27 MED ORDER — LIDOCAINE 2% (20 MG/ML) 5 ML SYRINGE
INTRAMUSCULAR | Status: DC | PRN
  Administered 2021-07-27: 40 mg via INTRAVENOUS

## 2021-07-27 MED ORDER — INSULIN ASPART 100 UNIT/ML IJ SOLN
0.0000 [IU] | Freq: Three times a day (TID) | INTRAMUSCULAR | Status: DC
  Administered 2021-07-28: 2 [IU] via SUBCUTANEOUS

## 2021-07-27 MED ORDER — ACETAMINOPHEN 10 MG/ML IV SOLN
1000.0000 mg | Freq: Once | INTRAVENOUS | Status: DC | PRN
  Administered 2021-07-27: 1000 mg via INTRAVENOUS

## 2021-07-27 MED ORDER — PHENAZOPYRIDINE HCL 100 MG PO TABS
200.0000 mg | ORAL_TABLET | ORAL | Status: AC
  Administered 2021-07-27: 200 mg via ORAL

## 2021-07-27 MED ORDER — SIMETHICONE 80 MG PO CHEW: 80.0000 mg | CHEWABLE_TABLET | Freq: Four times a day (QID) | ORAL | Status: DC | PRN

## 2021-07-27 SURGICAL SUPPLY — 71 items
ADH SKN CLS APL DERMABOND .7 (GAUZE/BANDAGES/DRESSINGS) ×8
APL PRP STRL LF DISP 70% ISPRP (MISCELLANEOUS) ×4
BLADE SURG 15 STRL LF DISP TIS (BLADE) ×4 IMPLANT
BLADE SURG 15 STRL SS (BLADE) ×5
CHLORAPREP W/TINT 26 (MISCELLANEOUS) ×5 IMPLANT
COVER BACK TABLE 60X90IN (DRAPES) ×5 IMPLANT
COVER TIP SHEARS 8 DVNC (MISCELLANEOUS) ×4 IMPLANT
COVER TIP SHEARS 8MM DA VINCI (MISCELLANEOUS) ×5
DEFOGGER SCOPE WARMER CLEARIFY (MISCELLANEOUS) ×5 IMPLANT
DERMABOND ADVANCED (GAUZE/BANDAGES/DRESSINGS) ×2
DERMABOND ADVANCED .7 DNX12 (GAUZE/BANDAGES/DRESSINGS) ×5 IMPLANT
DRAPE ARM DVNC X/XI (DISPOSABLE) ×16 IMPLANT
DRAPE COLUMN DVNC XI (DISPOSABLE) ×4 IMPLANT
DRAPE DA VINCI XI ARM (DISPOSABLE) ×20
DRAPE DA VINCI XI COLUMN (DISPOSABLE) ×5
DRAPE SHEET LG 3/4 BI-LAMINATE (DRAPES) ×2 IMPLANT
ELECT REM PT RETURN 9FT ADLT (ELECTROSURGICAL) ×5
ELECTRODE REM PT RTRN 9FT ADLT (ELECTROSURGICAL) ×4 IMPLANT
GAUZE 4X4 16PLY ~~LOC~~+RFID DBL (SPONGE) ×10 IMPLANT
GLOVE SURG POLYISO LF SZ6 (GLOVE) ×8 IMPLANT
GLOVE SURG POLYISO LF SZ7 (GLOVE) ×2 IMPLANT
GLOVE SURG UNDER POLY LF SZ6.5 (GLOVE) ×25 IMPLANT
GLOVE SURG UNDER POLY LF SZ7 (GLOVE) ×10 IMPLANT
GLOVE SURG UNDER POLY LF SZ7.5 (GLOVE) ×2 IMPLANT
GOWN STRL REUS W/TWL LRG LVL3 (GOWN DISPOSABLE) ×5 IMPLANT
HIBICLENS CHG 4% 4OZ (MISCELLANEOUS) ×5 IMPLANT
HOLDER FOLEY CATH W/STRAP (MISCELLANEOUS) ×5 IMPLANT
IRRIG SUCT STRYKERFLOW 2 WTIP (MISCELLANEOUS) ×5
IRRIGATION SUCT STRKRFLW 2 WTP (MISCELLANEOUS) ×4 IMPLANT
IV NS 1000ML (IV SOLUTION) ×10
IV NS 1000ML BAXH (IV SOLUTION) ×2 IMPLANT
KIT TURNOVER CYSTO (KITS) ×5 IMPLANT
LEGGING LITHOTOMY PAIR STRL (DRAPES) ×5 IMPLANT
MANIFOLD NEPTUNE II (INSTRUMENTS) ×5 IMPLANT
MESH RESTORELLE Y CONTOUR (Mesh General) ×2 IMPLANT
NEEDLE HYPO 22GX1.5 SAFETY (NEEDLE) ×5 IMPLANT
NEEDLE INSUFFLATION 120MM (ENDOMECHANICALS) ×5 IMPLANT
NS IRRIG 1000ML POUR BTL (IV SOLUTION) ×5 IMPLANT
OBTURATOR OPTICAL STANDARD 8MM (TROCAR) ×5
OBTURATOR OPTICAL STND 8 DVNC (TROCAR) ×4
OBTURATOR OPTICALSTD 8 DVNC (TROCAR) ×4 IMPLANT
PACK ROBOT WH (CUSTOM PROCEDURE TRAY) ×5 IMPLANT
PACK ROBOTIC GOWN (GOWN DISPOSABLE) ×5 IMPLANT
PACK VAGINAL MINOR WOMEN LF (CUSTOM PROCEDURE TRAY) ×5 IMPLANT
PACK VAGINAL WOMENS (CUSTOM PROCEDURE TRAY) ×5 IMPLANT
PAD OB MATERNITY 4.3X12.25 (PERSONAL CARE ITEMS) ×5 IMPLANT
PAD POSITIONING PINK XL (MISCELLANEOUS) ×5 IMPLANT
PAD PREP 24X48 CUFFED NSTRL (MISCELLANEOUS) ×5 IMPLANT
POUCH LAPAROSCOPIC INSTRUMENT (MISCELLANEOUS) ×5 IMPLANT
PROTECTOR NERVE ULNAR (MISCELLANEOUS) ×5 IMPLANT
RETRACTOR LONE STAR DISPOSABLE (INSTRUMENTS) ×5 IMPLANT
RETRACTOR STAY HOOK 5MM (MISCELLANEOUS) ×5 IMPLANT
SCISSORS LAP 5X35 DISP (ENDOMECHANICALS) ×2 IMPLANT
SEAL CANN UNIV 5-8 DVNC XI (MISCELLANEOUS) ×16 IMPLANT
SEAL XI 5MM-8MM UNIVERSAL (MISCELLANEOUS) ×20
SET IRRIG Y TYPE TUR BLADDER L (SET/KITS/TRAYS/PACK) ×5 IMPLANT
SET TUBE SMOKE EVAC HIGH FLOW (TUBING) ×5 IMPLANT
SPONGE T-LAP 4X18 ~~LOC~~+RFID (SPONGE) ×5 IMPLANT
SUT DVC VLOC 180 0 12IN GS21 (SUTURE) ×5
SUT GORETEX NAB #0 THX26 36IN (SUTURE) ×2 IMPLANT
SUT MNCRL AB 4-0 PS2 18 (SUTURE) ×9 IMPLANT
SUT MON AB 2-0 SH 27 (SUTURE) ×7 IMPLANT
SUT VIC AB 2-0 SH 27 (SUTURE) ×5
SUT VIC AB 2-0 SH 27X BRD (SUTURE) ×1 IMPLANT
SUT VICRYL 2-0 SH 8X27 (SUTURE) ×5 IMPLANT
SUT VLOC 180 2-0 9IN GS21 (SUTURE) ×10 IMPLANT
SUTURE DVC VLC 180 0 12IN GS21 (SUTURE) ×4 IMPLANT
SYR BULB EAR ULCER 3OZ GRN STR (SYRINGE) ×5 IMPLANT
TOWEL OR 17X26 10 PK STRL BLUE (TOWEL DISPOSABLE) ×5 IMPLANT
TRAY FOLEY W/BAG SLVR 14FR LF (SET/KITS/TRAYS/PACK) ×5 IMPLANT
TROCAR XCEL NON BLADE 8MM B8LT (ENDOMECHANICALS) ×4 IMPLANT

## 2021-07-27 NOTE — Progress Notes (Signed)
RN spoke with Dr Wannetta Sender about voiding trial.  Patient passed and MD gave orders for discharge.

## 2021-07-27 NOTE — Op Note (Signed)
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 Item Serial No. Manufacturer Lot No. LRB No. Used Action  MESH Julaine Hua - [id number] [place] [id number] N/A 1 Implanted    Attending Surgeon: Sherlene Shams, MD   Anesthesia: General endotracheal  Findings: 1. Stage II pelvic organ prolapse on vaginal exam  2. On laparoscopy, adhesions of omentum to the anterior midline abdominal wall   3. On cystoscopy, normal bladder and urethra without injury or lesion. Brisk bilateral ureteral efflux noted.   4. Normal appearing bilateral fallopian tubes and ovaries  Specimens: * No specimens in log *  Estimated blood loss: 75 mL  IV fluids: 200 mL  Urine output: 017 mL  Complications: none  Procedure in Detail:  After informed consent was obtained, the patient was taken to the operating room, where general anesthesia was induced and found to be adequate. She was placed in dorsolithotomy position in yellowfin stirrups. Her hips were noted not to be hyperflexed or hyperextended. Her arms were padded with gel pads and tucked to her sides. Her hands were surrounded by foam. A padded strap was placed across her chest with foam between the pad and her skin. She was noted to be appropriately positioned with all pressure points well padded and off tension. A tilt test showed no slippage. She was prepped and draped in the usual sterile fashion.  A sterile Foley catheter was inserted.   0.25% plain Marcaine was injected in the left upper quadrant area and an 8 mm supraumbilical skin incision was made with the scalpel.  A Veress needle was  inserted into the incision, CO2 insufflation was started, a low opening pressure was noted, and pneumoperitoneum was obtained. The Veress needle was removed and a 25m robotic trocar was placed. The robotic camera was inserted and intraperitoneal placement was confirmed. Survey of the abdomen and pelvis revealed the findings as noted above, specifically adhesions along the midline abdomen of the omentum to the anterior abdominal wall. The sacrum and lateral abdominal walls appeared to be free of any adhesive disease. After determining placement for the other ports, Local anesthetic was injected at each site and two 8 mm incisions were made for robotic ports at 10 cm lateral to and at the level of the umbilical port. Two additional 8 mm incisions were made 10 cm lateral to these and 30 degrees down followed by 8 mm robotic ports - the right side for an assistant port. The left side trocars were placed first. All trocars were placed sequentially under direct visualization of the camera. Adhesions were removed with laparoscopic monopolar scissors and cautery. The right sided ports were placed for additional visualization. Approximately 30 min was needed for lysis of adhesions. The patient was placed in Trendelenburg. The robot was docked on the patient's left side. Monopolar endoshears were placed in the right arm, a [place] bipolar grasper was placed in the 2nd arm of the patient's left side, and a Tip up grasper was placed in the 3rd arm on the patient's left side.    With a lucite probe in the vagina, the anterior vaginal dissection was then performed with sharp dissection and electrosurgery to remove the bladder from the vagina. The posterior vaginal dissection was  then performed with sharp dissection and electrosurgery in order to dissect the rectum away from the posterior vagina. Attention was then returned to the sacral promontory, which was palpated with an assistant grasper to confirm correct location.  The  overlying areolar and adipose tissue were taken down until the anterior longitudinal sacral ligament was identified. Small vessels were cauterized along the way to obtain excellent hemostasis. The peritoneal incision was extended down to the posterior cul-de-sac. This was performed with care to avoid the ureter on the right side and the sigmoid colon and its mesentary on the left side.  A "Y" mesh was then inserted into the abdomen after trimming to appropriate size. With the probe in the vagina, the anterior leaf of the Y mesh was affixed to the anterior portion of the vagina using a 2-0 v-loc suture in a spiral pattern to distribute the suture evenly across the surface of the anterior mesh leaf. In a similar fashion, the posterior leaf of the Y mesh was attached to the posterior surface of the vagina with 2-0 v-loc suture. The distal end of the mesh was then brought to overlie the sacrum. The correct amount of tension was determined in order to elevate the vagina, but not put the mesh under tension. The distal end of the mesh was then affixed to the anterior longitudinal sacral ligament using two interrupted transverse stitches of CV-2 Gortex. The excess distal mesh was then cut and removed. The peritoneum was reapproximated over the mesh using 2-0 monocryl. The bladder flap was incorporated to completely retroperitonealize the mesh. All areas were noted to be hemostatic as the CO2 gas was deflated. All instruments were removed from the patient's abdomen.   The Foley catheter was removed.  A 70-degree cystoscope was introduced, and 360-degree inspection revealed no injury, lesion or foreign body in the bladder. Brisk bilateral ureteral efflux was noted with the assistance of pyridium.  The bladder was drained and the cystoscope was removed.  The Foley catheter was replaced.  The robot was undocked. The CO2 gas was removed and the ports were removed.  The skin incisions were closed with subcutaneous stitches  of 4-0 Monocryl and covered with skin glue.  Sponge, lap, and needle counts were correct x 2. The patient tolerated the procedure well. She was awakened from anesthesia and transferred to the recovery room in stable condition.   [name], MD

## 2021-07-27 NOTE — Anesthesia Postprocedure Evaluation (Signed)
Anesthesia Post Note  Patient: RAYAN DYAL  Procedure(s) Performed: XI ROBOTIC ASSISTED LAPAROSCOPIC SACROCOLPOPEXY (Abdomen) CYSTOSCOPY (Urethra) XI ROBOTIC ASSISTED LAPAROSCOPIC LYSIS OF ADHESION (Abdomen)     Patient location during evaluation: PACU Anesthesia Type: General Level of consciousness: awake and alert Pain management: pain level controlled Vital Signs Assessment: post-procedure vital signs reviewed and stable Respiratory status: spontaneous breathing, nonlabored ventilation, respiratory function stable and patient connected to nasal cannula oxygen Cardiovascular status: blood pressure returned to baseline and stable Postop Assessment: no apparent nausea or vomiting Anesthetic complications: no   No notable events documented.  Last Vitals:  Vitals:   07/27/21 1330 07/27/21 1424  BP: 118/64 119/66  Pulse: 78 80  Resp: 15 16  Temp: 36.6 C 36.6 C  SpO2: 94% 97%    Last Pain:  Vitals:   07/27/21 1310  TempSrc:   PainSc: 0-No pain                 Effie Berkshire

## 2021-07-27 NOTE — Transfer of Care (Signed)
Immediate Anesthesia Transfer of Care Note  Patient: Mary Donovan  Procedure(s) Performed: XI ROBOTIC ASSISTED LAPAROSCOPIC SACROCOLPOPEXY (Abdomen) CYSTOSCOPY (Urethra) XI ROBOTIC ASSISTED LAPAROSCOPIC LYSIS OF ADHESION (Abdomen)  Patient Location: PACU  Anesthesia Type:General  Level of Consciousness: sedated  Airway & Oxygen Therapy: Patient Spontanous Breathing and Patient connected to nasal cannula oxygen  Post-op Assessment: Report given to RN  Post vital signs: Reviewed and stable  Last Vitals:  Vitals Value Taken Time  BP 132/66 07/27/21 1215  Temp 36.1 C 07/27/21 1120  Pulse 90 07/27/21 1226  Resp 20 07/27/21 1226  SpO2 90 % 07/27/21 1226  Vitals shown include unvalidated device data.  Last Pain:  Vitals:   07/27/21 1215  TempSrc:   PainSc: Asleep      Patients Stated Pain Goal: 4 (25/[id number]/63 [id number])  Complications: No notable events documented.

## 2021-07-27 NOTE — Progress Notes (Signed)
RN spoke with Dr Wannetta Sender about patient's BP and not feeling well as we had her in the [facility] ready for discharge.  MD said to keep patient a little longer and push fluids and we could discharge if feeling better.  RN assisted patient back to bed with no problems.

## 2021-07-27 NOTE — Anesthesia Preprocedure Evaluation (Addendum)
Anesthesia Evaluation  Patient identified by MRN, date of birth, ID band Patient awake    Reviewed: Allergy & Precautions, NPO status , Patient's Chart, lab work & pertinent test results  History of Anesthesia Complications (+) PONV and history of anesthetic complications  Airway Mallampati: II  TM Distance: >3 FB Neck ROM: Full    Dental  (+) Teeth Intact, Dental Advisory Given   Pulmonary neg pulmonary ROS,    breath sounds clear to auscultation       Cardiovascular hypertension, Pt. on medications  Rhythm:Regular Rate:Normal     Neuro/Psych negative neurological ROS  negative psych ROS   GI/Hepatic negative GI ROS, Neg liver ROS,   Endo/Other  diabetes, Type 2, Oral Hypoglycemic Agents  Renal/GU negative Renal ROS     Musculoskeletal  (+) Arthritis ,   Abdominal Normal abdominal exam  (+)   Peds  Hematology   Anesthesia Other Findings   Reproductive/Obstetrics                            Anesthesia Physical Anesthesia Plan  ASA: 2  Anesthesia Plan: General   Post-op Pain Management:    Induction: Intravenous  PONV Risk Score and Plan: 4 or greater and Ondansetron, Dexamethasone, Midazolam and Scopolamine patch - Pre-op  Airway Management Planned: Oral ETT  Additional Equipment: None  Intra-op Plan:   Post-operative Plan: Extubation in OR  Informed Consent: I have reviewed the patients History and Physical, chart, labs and discussed the procedure including the risks, benefits and alternatives for the proposed anesthesia with the patient or authorized representative who has indicated his/her understanding and acceptance.     Dental advisory given  Plan Discussed with: CRNA  Anesthesia Plan Comments: (- 2 IV's)       Anesthesia Quick Evaluation

## 2021-07-27 NOTE — Interval H&P Note (Signed)
History and Physical Interval Note:  07/27/2021 7:12 AM  Mary Donovan  has presented today for surgery, with the diagnosis of anterior prolapse.  The various methods of treatment have been discussed with the patient and family. After consideration of risks, benefits and other options for treatment, the patient has consented to  Procedure(s): XI ROBOTIC ASSISTED LAPAROSCOPIC SACROCOLPOPEXY (N/A) CYSTOSCOPY (N/A) SACROSPINOUS FIXATION (possible) (N/A) ANTERIOR REPAIR (CYSTOCELE) (possible) (N/A) as a surgical intervention.    Vitals:   07/27/21 0615  BP: (!) 173/82  Pulse: 70  Resp: 17  Temp: 97.9 F (36.6 C)  SpO2: 98%    Gen: NAD CV: S1 S2 RRR Lungs: Clear to auscultation bilaterally Abd: soft, nontender  The patient's history has been reviewed, patient examined, no change in status, stable for surgery.  I have reviewed the patient's chart and labs.  Questions were answered to the patient's satisfaction.     [name]

## 2021-07-27 NOTE — Anesthesia Procedure Notes (Signed)
Procedure Name: Intubation Date/Time: 07/27/2021 7:40 AM Performed by: Bonney Aid, CRNA Pre-anesthesia Checklist: Patient identified, Emergency Drugs available, Suction available and Patient being monitored Patient Re-evaluated:Patient Re-evaluated prior to induction Oxygen Delivery Method: Circle system utilized Preoxygenation: Pre-oxygenation with 100% oxygen Induction Type: IV induction Ventilation: Mask ventilation without difficulty Laryngoscope Size: Mac, 3 and Glidescope Tube type: Oral Tube size: 7.0 mm Number of attempts: 1 Airway Equipment and Method: Stylet Placement Confirmation: ETT inserted through vocal cords under direct vision, positive ETCO2 and breath sounds checked- equal and bilateral Secured at: 20 cm Tube secured with: Tape Dental Injury: Teeth and Oropharynx as per pre-operative assessment  Difficulty Due To: Difficulty was unanticipated, Difficult Airway- due to reduced neck mobility and Difficult Airway- due to limited oral opening Comments: DL times 2 with very limited view, no attempt to intubate.  Easily ventilated, glidescope used with good view and atraumatic intubation per Dr Smith Robert.

## 2021-07-27 NOTE — Discharge Instructions (Addendum)
POST OPERATIVE INSTRUCTIONS  General Instructions Recovery (not bed rest) will last approximately 6 weeks Walking is encouraged, but refrain from strenuous exercise/ housework/ heavy lifting. No lifting >10lbs  Nothing in the vagina- NO intercourse, tampons or douching Bathing:  Do not submerge in water (NO swimming, bath, hot tub, etc) until after your postop visit. You can shower starting the day after surgery.  No driving until you are not taking narcotic pain medicine and until your pain is well enough controlled that you can slam on the breaks or make sudden movements if needed.   Taking your medications Please take your acetaminophen and ibuprofen on a schedule for the first 48 hours. Take 675m ibuprofen, then take 508macetaminophen 3 hours later, then continue to alternate ibuprofen and acetaminophen. That way you are taking each type of medication every 6 hours. Take the prescribed narcotic (oxycodone, tramadol, etc) as needed, with a maximum being every 4 hours.  Take a stool softener daily to keep your stools soft and preventing you from straining. If you have diarrhea, you decrease your stool softener. This is explained more below. We have prescribed you Miralax.  Reasons to Call the Nurse (see last page for phone numbers) Heavy Bleeding (changing your pad every 1-2 hours) Persistent nausea/vomiting Fever (100.4 degrees or more) Incision problems (pus or other fluid coming out, redness, warmth, increased pain)  Things to Expect After Surgery Mild to Moderate pain is normal during the first day or two after surgery. If prescribed, take Ibuprofen or Tylenol first and use the stronger medicine for "break-through" pain. You can overlap these medicines because they work differently.   Constipation   To Prevent Constipation:  Eat a well-balanced diet including protein, grains, fresh fruit and vegetables.  Drink plenty of fluids. Walk regularly.  Depending on specific instructions  from your physician: take Miralax daily and additionally you can add a stool softener (colace/ docusate) and fiber supplement. Continue as long as you're on pain medications.   To Treat Constipation:  If you do not have a bowel movement in 2 days after surgery, you can take 2 Tbs of Milk of Magnesia 1-2 times a day until you have a bowel movement. If diarrhea occurs, decrease the amount or stop the laxative. If no results with Milk of Magnesia, you can drink a bottle of magnesium citrate which you can purchase over the counter.  Fatigue:  This is a normal response to surgery and will improve with time.  Plan frequent rest periods throughout the day.  Gas Pain:  This is very common but can also be very painful! Drink warm liquids such as herbal teas, bouillon or soup. Walking will help you pass more gas.  Mylicon or Gas-X can be taken over the counter.  Leaking Urine:  Varying amounts of leakage may occur after surgery.  This should improve with time. Your bladder needs at least 3 months to recover from surgery. If you leak after surgery, be sure to mention this to your doctor at your post-op visit. If you were taking medications for overactive bladder prior to surgery, be sure to restart the medications immediately after surgery.  Incisions: If you have incisions on your abdomen, the skin glue will dissolve on its own over time. It is ok to gently rinse with soap and water over these incisions but do not scrub.  Catheter Approximately 50% of patients are unable to urinate after surgery and need to go home with a catheter. This allows your bladder to  rest so it can return to full function. If you go home with a catheter, the office will call to set up a voiding trial a few days after surgery. For most patients, by this visit, they are able to urinate on their own. Long term catheter use is rare.   Return to Work  As work demands and recovery times vary widely, it is hard to predict when you will want  to return to work. If you have a desk job with no strenuous physical activity, and if you would like to return sooner than generally recommended, discuss this with your provider or call our office.   Post op concerns  For non-emergent issues, please call the Urogynecology Nurse. Please leave a message and someone will contact you within one business day.  You can also send a message through [employer].   AFTER HOURS (After 5:00 PM and on weekends):  For urgent matters that cannot wait until the next business day. Call our office [phone] and connect to the doctor on call.  Please reserve this for important issues.   **FOR ANY TRUE EMERGENCY ISSUES CALL 911 OR GO TO THE NEAREST EMERGENCY ROOM.** Please inform our office or the doctor on call of any emergency.     APPOINTMENTS: Call [phone]   Post Anesthesia Home Care Instructions  Activity: Get plenty of rest for the remainder of the day. A responsible individual must stay with you for 24 hours following the procedure.  For the next 24 hours, DO NOT: -Drive a car -Paediatric nurse -Drink alcoholic beverages -Take any medication unless instructed by your physician -Make any legal decisions or sign important papers.  Meals: Start with liquid foods such as gelatin or soup. Progress to regular foods as tolerated. Avoid greasy, spicy, heavy foods. If nausea and/or vomiting occur, drink only clear liquids until the nausea and/or vomiting subsides. Call your physician if vomiting continues.  Special Instructions/Symptoms: Your throat may feel dry or sore from the anesthesia or the breathing tube placed in your throat during surgery. If this causes discomfort, gargle with warm salt water. The discomfort should disappear within 24 hours.  If you had a scopolamine patch placed behind your ear for the management of post- operative nausea and/or vomiting:  1. The medication in the patch is effective for 72 hours, after which it should be  removed.  Wrap patch in a tissue and discard in the trash. Wash hands thoroughly with soap and water. 2. You may remove the patch earlier than 72 hours if you experience unpleasant side effects which may include dry mouth, dizziness or visual disturbances. 3. Avoid touching the patch. Wash your hands with soap and water after contact with the patch.

## 2021-07-27 NOTE — Progress Notes (Signed)
Dr. Wannetta Sender called updated with BP 85/51, Pt up to BR unable to void back to bed bladder scanned for 105 cc of urine in bladder.   Pt not drining and lasted voided at 1640 200 cc.   Orders received and will continue to monitor output and VS

## 2021-07-28 ENCOUNTER — Encounter (HOSPITAL_BASED_OUTPATIENT_CLINIC_OR_DEPARTMENT_OTHER): Payer: Self-pay | Admitting: Obstetrics and Gynecology

## 2021-07-28 DIAGNOSIS — Z85828 Personal history of other malignant neoplasm of skin: Secondary | ICD-10-CM | POA: Diagnosis not present

## 2021-07-28 DIAGNOSIS — R42 Dizziness and giddiness: Secondary | ICD-10-CM | POA: Diagnosis not present

## 2021-07-28 DIAGNOSIS — Z881 Allergy status to other antibiotic agents status: Secondary | ICD-10-CM | POA: Diagnosis not present

## 2021-07-28 DIAGNOSIS — Z8616 Personal history of COVID-19: Secondary | ICD-10-CM | POA: Diagnosis not present

## 2021-07-28 DIAGNOSIS — Z90710 Acquired absence of both cervix and uterus: Secondary | ICD-10-CM | POA: Diagnosis not present

## 2021-07-28 DIAGNOSIS — E538 Deficiency of other specified B group vitamins: Secondary | ICD-10-CM | POA: Diagnosis present

## 2021-07-28 DIAGNOSIS — E7800 Pure hypercholesterolemia, unspecified: Secondary | ICD-10-CM | POA: Diagnosis present

## 2021-07-28 DIAGNOSIS — Z79899 Other long term (current) drug therapy: Secondary | ICD-10-CM | POA: Diagnosis not present

## 2021-07-28 DIAGNOSIS — N736 Female pelvic peritoneal adhesions (postinfective): Secondary | ICD-10-CM | POA: Diagnosis present

## 2021-07-28 DIAGNOSIS — E119 Type 2 diabetes mellitus without complications: Secondary | ICD-10-CM | POA: Diagnosis present

## 2021-07-28 DIAGNOSIS — M1990 Unspecified osteoarthritis, unspecified site: Secondary | ICD-10-CM | POA: Diagnosis present

## 2021-07-28 DIAGNOSIS — Z803 Family history of malignant neoplasm of breast: Secondary | ICD-10-CM | POA: Diagnosis not present

## 2021-07-28 DIAGNOSIS — Z791 Long term (current) use of non-steroidal anti-inflammatories (NSAID): Secondary | ICD-10-CM | POA: Diagnosis not present

## 2021-07-28 DIAGNOSIS — N993 Prolapse of vaginal vault after hysterectomy: Secondary | ICD-10-CM | POA: Diagnosis present

## 2021-07-28 DIAGNOSIS — I10 Essential (primary) hypertension: Secondary | ICD-10-CM | POA: Diagnosis present

## 2021-07-28 DIAGNOSIS — K5090 Crohn's disease, unspecified, without complications: Secondary | ICD-10-CM | POA: Diagnosis present

## 2021-07-28 DIAGNOSIS — Z833 Family history of diabetes mellitus: Secondary | ICD-10-CM | POA: Diagnosis not present

## 2021-07-28 DIAGNOSIS — Z7984 Long term (current) use of oral hypoglycemic drugs: Secondary | ICD-10-CM | POA: Diagnosis not present

## 2021-07-28 LAB — GLUCOSE, CAPILLARY
Glucose-Capillary: 162 mg/dL — ABNORMAL HIGH (ref 70–99)
Glucose-Capillary: 128 mg/dL — ABNORMAL HIGH (ref 70–99)

## 2021-07-28 MED ORDER — INSULIN ASPART 100 UNIT/ML IJ SOLN
INTRAMUSCULAR | Status: AC
  Filled 2021-07-28: qty 1

## 2021-07-28 NOTE — Progress Notes (Signed)
1 Day Post-Op Procedure(s) (LRB): XI ROBOTIC ASSISTED LAPAROSCOPIC SACROCOLPOPEXY (N/A) CYSTOSCOPY (N/A) XI ROBOTIC ASSISTED LAPAROSCOPIC LYSIS OF ADHESION  Subjective: Patient reports she is feeling well today. Overnight had some nausea and dizziness which has improved. Able to tolerate regular diet. Voided yesterday with voiding trial but has not voided much since then. Nurse did a bladder scan this morning which only showed 154m. Pain is well controlled. Amublating well.   Objective: Today's Vitals   07/28/21 0445 07/28/21 0615 07/28/21 0627 07/28/21 0729  BP:  (!) 98/48 (!) 100/53   Pulse:  79    Resp:  (!) 22    Temp:  98.1 F (36.7 C)    TempSrc:      SpO2:  93%    Weight:      Height:      PainSc: Asleep 0-No pain  0-No pain   Body mass index is 28.66 kg/m. Last glucose: 162  General: alert and cooperative GI: soft, non-tender; no masses,  no organomegaly and incision: clean, dry, and intact Extremities: edema in ankles, SCDs in palce  Assessment: s/p Procedure(s): XI ROBOTIC ASSISTED LAPAROSCOPIC SACROCOLPOPEXY (N/A) CYSTOSCOPY (N/A) XI ROBOTIC ASSISTED LAPAROSCOPIC LYSIS OF ADHESION: stable  Plan: - regular carb controlled diet - Encourage ambulation - Discontinue IV fluids - She will take her morning lasix and try to void. If unable to void, will straight catheterize and assess need for catheter on discharge.  - Plan for discharge home   LOS: 0 days    MJaquita Folds10/09/2021, 8:22 AM

## 2021-07-28 NOTE — Progress Notes (Signed)
07/28/2021 11:11 AM Confirmed with Dr. Wannetta Sender pt. Is appropriate for d/c home at this time via telephone call. Pt. D/c home per prior orders.  Mary Donovan, Arville Lime

## 2021-07-28 NOTE — Progress Notes (Signed)
Pt doesn't feel like she has to go to void.  Pt OOB assisted to BR sat on toilet unable to void.   Pt back to bed bladder scanner used for amts of 94 cc, 96 cc and 126 cc of uine in bladder.   Pt given fresh ice water and encouraged to drink.  Will continue to monitor.

## 2021-07-29 ENCOUNTER — Encounter (HOSPITAL_BASED_OUTPATIENT_CLINIC_OR_DEPARTMENT_OTHER): Payer: Self-pay | Admitting: Obstetrics and Gynecology

## 2021-07-30 ENCOUNTER — Telehealth: Payer: Self-pay | Admitting: Obstetrics and Gynecology

## 2021-07-30 NOTE — Telephone Encounter (Signed)
Pt states that she has been bleeding from the top right laparoscopic surgical site. She states that the bleeding started at 1130 on Wednesday night. She states that she has kept it covered and wrapped a gauze around to help apply pressure since her skin is sensitive to tape. She states that the amount of bleeding has slowed down since then. Advised to continue with pressure guaze to the area. Advised that if it starts to actively bleed again to call after hours line or go to ED. Advised that we would follow up with her on Monday and have her come into the office if needed. Pt verbalized understanding.

## 2021-08-02 ENCOUNTER — Encounter: Payer: Self-pay | Admitting: Obstetrics and Gynecology

## 2021-08-02 ENCOUNTER — Other Ambulatory Visit: Payer: Self-pay

## 2021-08-02 ENCOUNTER — Encounter: Payer: Self-pay | Admitting: *Deleted

## 2021-08-02 ENCOUNTER — Ambulatory Visit (INDEPENDENT_AMBULATORY_CARE_PROVIDER_SITE_OTHER): Payer: Medicare Other | Admitting: Obstetrics and Gynecology

## 2021-08-02 VITALS — BP 168/83 | HR 109

## 2021-08-02 DIAGNOSIS — S301XXA Contusion of abdominal wall, initial encounter: Secondary | ICD-10-CM

## 2021-08-02 LAB — CBC
Hematocrit: 23.6 % — ABNORMAL LOW (ref 34.0–46.6)
Hemoglobin: 7.8 g/dL — ABNORMAL LOW (ref 11.1–15.9)
MCH: 28.4 pg (ref 26.6–33.0)
MCHC: 33.1 g/dL (ref 31.5–35.7)
MCV: 86 fL (ref 79–97)
Platelets: 409 10*3/uL (ref 150–450)
RBC: 2.75 x10E6/uL — ABNORMAL LOW (ref 3.77–5.28)
RDW: 13.9 % (ref 11.7–15.4)
WBC: 11.5 10*3/uL — ABNORMAL HIGH (ref 3.4–10.8)

## 2021-08-02 NOTE — Progress Notes (Signed)
[facility]  Date of Visit: 08/02/2021  History of Present Illness: [name] is a 75 y.o. female scheduled today for a post-operative visit.   Surgery: s/p Robotic sacrocolpopexy, lysis of adhesions and cystoscopy on 07/27/21  She reports that she has not been feeling well, feeling very fatigued. Has noticed bleeding from her upper right port site, which comes and goes. She also has bruising on her abdomen. Her PCP has taken her off some of her blood pressure medications since her BP has been running lower than normal.   Medications: She has a current medication list which includes the following prescription(s): acetaminophen, celecoxib, contour next test, estradiol, furosemide, gabapentin, glimepiride, metformin, montelukast, OVER THE COUNTER MEDICATION, OVER THE COUNTER MEDICATION, PRESCRIPTION MEDICATION, simvastatin, UNABLE TO FIND, UNABLE TO FIND, and valsartan.   Allergies: Patient is allergic to clindamycin, penicillin g, tape, latex, and neosporin original [bacitracin-neomycin-polymyxin].   Physical Exam: BP (!) 168/83   Pulse (!) 109   Abdomen: Extensive purple bruising on the lower abdomen and suprapubic area sparing the midline and flanks. Appears dependent. soft, non-tender, without masses or organomegaly 6 laparoscopic Incisions with skin glue. Upper right incision slowly oozing, pressure dressing placed.    ---------------------------------------------------------  Assessment and Plan:  1. Contusion of abdominal wall, initial encounter    - Bruising appears superficial but want to ensure there is no deeper bleeding process. Will obtain CBC and CT abdomen and pelvis with contrast.   [name], MD

## 2021-08-02 NOTE — Discharge Summary (Signed)
Physician Discharge Summary   Patient ID: Mary Donovan [id number] 75 y.o. 1946-06-27  Admit date: 07/27/2021  Discharge date and time: 07/28/2021 11:15 AM   Admitting Physician: [name], MD   Discharge Physician: [name]   Admission Diagnoses: Prolapse of anterior vaginal wall [N81.10] Vaginal vault prolapse after hysterectomy [N99.3]  Discharge Diagnoses: same  Admission Condition: good  Discharged Condition: good  Indication for Admission: post-operative care  Hospital Course: The patient was admitted on 07/27/21 for a Robotic assisted Sacrocolpopexy, lysis of adhesions and cystoscopy. Post operatively she felt dizzy, nauseated and blood pressure remained lower than normal. She was given IV fluids and anti-emetics. She passed her voiding trial. On POD#1, symptoms had resolved, and she was tolerating regular diet, ambulating and voiding.   Consults: None  Significant Diagnostic Studies: none  Treatments: IV hydration  Discharge Exam: General: alert and cooperative GI: soft, non-tender; no masses,  no organomegaly and incision: clean, dry, and intact Extremities: edema in ankles, SCDs in palce  Disposition: Discharge disposition: 01-Home or Self Care       Patient Instructions:  Allergies as of 07/28/2021       Reactions   Clindamycin Swelling   Penicillin G Swelling   Tape    paper   Latex Rash   Neosporin Original [bacitracin-neomycin-polymyxin] Rash        Medication List     TAKE these medications    acetaminophen 325 MG tablet Commonly known as: TYLENOL Take 650 mg by mouth at bedtime.   celecoxib 200 MG capsule Commonly known as: CELEBREX TAKE 1 CAPSULE EVERY DAY WITH FOOD   Contour Next Test test strip Generic drug: glucose blood daily.   estradiol 0.1 MG/GM vaginal cream Commonly known as: ESTRACE Place 0.5 g vaginally 2 (two) times a week. Place 0.5g nightly for two weeks then twice a week after    furosemide 20 MG tablet Commonly known as: LASIX Take 20 mg by mouth daily.   gabapentin 300 MG capsule Commonly known as: NEURONTIN Take 300 mg by mouth at bedtime.   glimepiride 1 MG tablet Commonly known as: AMARYL Take 1 mg by mouth daily.   metFORMIN 500 MG tablet Commonly known as: GLUCOPHAGE Take 1,000 mg by mouth 2 (two) times daily.   montelukast 10 MG tablet Commonly known as: SINGULAIR Take 10 mg by mouth at bedtime.   OVER THE COUNTER MEDICATION 1600 iu vitamin d daily   OVER THE COUNTER MEDICATION Immodium prn 1 to 2 tabs   PRESCRIPTION MEDICATION Vitamin b 12 shot q month   simvastatin 20 MG tablet Commonly known as: ZOCOR Take 20 mg by mouth daily.   UNABLE TO FIND Med Name:calcium 600 mg bid   UNABLE TO FIND Med Name: magnesium 250  mg bid   valsartan 80 MG tablet Commonly known as: DIOVAN Take 80 mg by mouth daily.       Activity: activity as tolerated and no lifting, driving, or strenuous exercise for 6 weeks Diet: diabetic diet Wound Care: keep wound clean and dry  Follow-up 6 weeks or sooner if needed  Signed: [name] 08/02/2021 2:25 PM

## 2021-08-03 ENCOUNTER — Ambulatory Visit (HOSPITAL_COMMUNITY)
Admission: RE | Admit: 2021-08-03 | Discharge: 2021-08-03 | Disposition: A | Discharge status: Home / Self Care | Payer: Medicare Other | Source: Ambulatory Visit | Attending: Obstetrics and Gynecology | Admitting: Obstetrics and Gynecology

## 2021-08-03 ENCOUNTER — Encounter (HOSPITAL_COMMUNITY): Payer: Self-pay | Admitting: Obstetrics and Gynecology

## 2021-08-03 ENCOUNTER — Encounter (HOSPITAL_COMMUNITY): Payer: Self-pay

## 2021-08-03 ENCOUNTER — Observation Stay (HOSPITAL_COMMUNITY)
Admission: AD | Admit: 2021-08-03 | Discharge: 2021-08-04 | Disposition: A | Discharge status: Home / Self Care | Payer: Medicare Other | Source: Ambulatory Visit | Attending: Obstetrics and Gynecology | Admitting: Obstetrics and Gynecology

## 2021-08-03 DIAGNOSIS — Z7984 Long term (current) use of oral hypoglycemic drugs: Secondary | ICD-10-CM | POA: Insufficient documentation

## 2021-08-03 DIAGNOSIS — K429 Umbilical hernia without obstruction or gangrene: Secondary | ICD-10-CM | POA: Diagnosis not present

## 2021-08-03 DIAGNOSIS — Z79899 Other long term (current) drug therapy: Secondary | ICD-10-CM | POA: Insufficient documentation

## 2021-08-03 DIAGNOSIS — Z85828 Personal history of other malignant neoplasm of skin: Secondary | ICD-10-CM | POA: Insufficient documentation

## 2021-08-03 DIAGNOSIS — S301XXA Contusion of abdominal wall, initial encounter: Secondary | ICD-10-CM

## 2021-08-03 DIAGNOSIS — Z20822 Contact with and (suspected) exposure to covid-19: Secondary | ICD-10-CM | POA: Insufficient documentation

## 2021-08-03 DIAGNOSIS — E119 Type 2 diabetes mellitus without complications: Secondary | ICD-10-CM | POA: Insufficient documentation

## 2021-08-03 DIAGNOSIS — I2510 Atherosclerotic heart disease of native coronary artery without angina pectoris: Secondary | ICD-10-CM | POA: Insufficient documentation

## 2021-08-03 DIAGNOSIS — Z881 Allergy status to other antibiotic agents status: Secondary | ICD-10-CM | POA: Insufficient documentation

## 2021-08-03 DIAGNOSIS — D6489 Other specified anemias: Secondary | ICD-10-CM | POA: Diagnosis not present

## 2021-08-03 DIAGNOSIS — D649 Anemia, unspecified: Secondary | ICD-10-CM | POA: Diagnosis present

## 2021-08-03 DIAGNOSIS — K8020 Calculus of gallbladder without cholecystitis without obstruction: Secondary | ICD-10-CM | POA: Diagnosis not present

## 2021-08-03 DIAGNOSIS — Z880 Allergy status to penicillin: Secondary | ICD-10-CM | POA: Insufficient documentation

## 2021-08-03 DIAGNOSIS — Z8616 Personal history of COVID-19: Secondary | ICD-10-CM | POA: Insufficient documentation

## 2021-08-03 DIAGNOSIS — I700 Atherosclerosis of aorta: Secondary | ICD-10-CM | POA: Insufficient documentation

## 2021-08-03 DIAGNOSIS — R109 Unspecified abdominal pain: Secondary | ICD-10-CM | POA: Diagnosis not present

## 2021-08-03 LAB — RESP PANEL BY RT-PCR (FLU A&B, COVID) ARPGX2
Influenza A by PCR: NEGATIVE
Influenza B by PCR: NEGATIVE
SARS Coronavirus 2 by RT PCR: NEGATIVE

## 2021-08-03 LAB — GLUCOSE, CAPILLARY
Glucose-Capillary: 108 mg/dL — ABNORMAL HIGH (ref 70–99)
Glucose-Capillary: 131 mg/dL — ABNORMAL HIGH (ref 70–99)

## 2021-08-03 LAB — PREPARE RBC (CROSSMATCH)

## 2021-08-03 MED ORDER — GABAPENTIN 300 MG PO CAPS: 300.0000 mg | ORAL_CAPSULE | Freq: Every day | ORAL | Status: DC

## 2021-08-03 MED ORDER — SODIUM CHLORIDE 0.9% IV SOLUTION: Freq: Once | INTRAVENOUS | Status: AC

## 2021-08-03 MED ORDER — ACETAMINOPHEN 325 MG PO TABS
650.0000 mg | ORAL_TABLET | Freq: Once | ORAL | Status: AC
  Administered 2021-08-03: 650 mg via ORAL
  Filled 2021-08-03: qty 2

## 2021-08-03 MED ORDER — ACETAMINOPHEN 650 MG RE SUPP: 650.0000 mg | Freq: Four times a day (QID) | RECTAL | Status: DC | PRN

## 2021-08-03 MED ORDER — IOHEXOL 350 MG/ML SOLN
80.0000 mL | Freq: Once | INTRAVENOUS | Status: AC | PRN
  Administered 2021-08-03: 80 mL via INTRAVENOUS

## 2021-08-03 MED ORDER — ACETAMINOPHEN 325 MG PO TABS: 650.0000 mg | ORAL_TABLET | Freq: Four times a day (QID) | ORAL | Status: DC | PRN

## 2021-08-03 MED ORDER — METFORMIN HCL 500 MG PO TABS
1000.0000 mg | ORAL_TABLET | Freq: Two times a day (BID) | ORAL | Status: DC
  Administered 2021-08-04: 1000 mg via ORAL
  Filled 2021-08-03: qty 2

## 2021-08-03 MED ORDER — ONDANSETRON HCL 4 MG PO TABS: 4.0000 mg | ORAL_TABLET | Freq: Four times a day (QID) | ORAL | Status: DC | PRN

## 2021-08-03 MED ORDER — DIPHENHYDRAMINE HCL 25 MG PO CAPS
25.0000 mg | ORAL_CAPSULE | Freq: Once | ORAL | Status: AC
  Administered 2021-08-03: 25 mg via ORAL
  Filled 2021-08-03: qty 1

## 2021-08-03 MED ORDER — ONDANSETRON HCL 4 MG/2ML IJ SOLN: 4.0000 mg | Freq: Four times a day (QID) | INTRAMUSCULAR | Status: DC | PRN

## 2021-08-03 MED ORDER — MONTELUKAST SODIUM 10 MG PO TABS: 10.0000 mg | ORAL_TABLET | Freq: Every day | ORAL | Status: DC

## 2021-08-03 MED ORDER — GLIMEPIRIDE 1 MG PO TABS
1.0000 mg | ORAL_TABLET | Freq: Every day | ORAL | Status: DC
  Administered 2021-08-04: 1 mg via ORAL
  Filled 2021-08-03: qty 1

## 2021-08-03 MED ORDER — FUROSEMIDE 10 MG/ML IJ SOLN
20.0000 mg | Freq: Once | INTRAMUSCULAR | Status: AC
  Administered 2021-08-03: 20 mg via INTRAVENOUS
  Filled 2021-08-03: qty 2

## 2021-08-03 MED ORDER — SIMVASTATIN 20 MG PO TABS
20.0000 mg | ORAL_TABLET | Freq: Every day | ORAL | Status: DC
  Administered 2021-08-04: 20 mg via ORAL
  Filled 2021-08-03: qty 1

## 2021-08-03 NOTE — H&P (Signed)
History and Physical   HPI:  Mary Donovan is an 75 y.o. female s/p robotic sacrolpopexy, lysis of adhesions and cystoscopy on 07/27/21. She presents for admission for blood transfusion due to post-operative anemia. She was seen in the office on 10/17 and was noted to have bleeding from the right port site, and extensive bruising on the abdomen. Patient reports fatigue, shaking and dizziness with standing. Also has had a headache for a few days not relieved with tylenol. Her PCP has taken her off BP medication due to her BP running low. CT scan was ordered which did not show any hematoma or fluid collection. Hgb was ordered which was 7.8. Denies fever or chills.    Past Medical History:  Diagnosis Date   Arthritis    Complication of anesthesia    COVID 11/26/2020   asymptomatic   Crohn disease ([place])    DM type 2 (diabetes mellitus, type 2) ([place])    Hypercholesterolemia    Hypertension    PONV (postoperative nausea and vomiting)    slowo to wake up   Skin cancer    forehead and face basal cell carconoma removed   Uterine prolapse without vaginal wall prolapse    Vaginal Pap smear, abnormal 1978   hysterectomy   Vitamin B 12 deficiency     Past Surgical History:  Procedure Laterality Date   ANTERIOR AND POSTERIOR REPAIR WITH SACROSPINOUS FIXATION N/A 12/21/2020   Procedure: SACROSPINOUS FIXATION;;  Surgeon: [name], MD;  Location: [facility];  Service: Gynecology;  Laterality: N/A;  total time of procedure 120 min; anterior repair only, no posterior   APPENDECTOMY  1999   with colon surgery   BREAST REDUCTION SURGERY  1985   CYSTOCELE REPAIR N/A 12/21/2020   Procedure: ANTERIOR REPAIR (CYSTOCELE);  Surgeon: [name], MD;  Location: [facility];  Service: Gynecology;  Laterality: N/A;   CYSTOSCOPY N/A 12/21/2020   Procedure: CYSTOSCOPY;  Surgeon: [name], MD;  Location: [facility];  Service: Gynecology;   Laterality: N/A;   CYSTOSCOPY N/A 07/27/2021   Procedure: CYSTOSCOPY;  Surgeon: [name], MD;  Location: [facility];  Service: Gynecology;  Laterality: N/A;   Illectomy  02/1998   left foot reconstruction  07/2011 left right dec 2012   PERINEOPLASTY N/A 12/21/2020   Procedure: PERINEOPLASTY;  Surgeon: [name], MD;  Location[facility];  Service: Gynecology;  Laterality: N/A;   right big toe blister  drained  2013   wound caree done   right great toe wound care  2020   ROBOTIC ASSISTED LAPAROSCOPIC LYSIS OF ADHESION  07/27/2021   Procedure: XI ROBOTIC ASSISTED LAPAROSCOPIC LYSIS OF ADHESION;  Surgeon: [name], MD;  Location: [facility];  Service: Gynecology;;   ROBOTIC ASSISTED LAPAROSCOPIC SACROCOLPOPEXY N/A 07/27/2021   Procedure: XI ROBOTIC ASSISTED LAPAROSCOPIC SACROCOLPOPEXY;  Surgeon: [name], MD;  Location: [facility];  Service: Gynecology;  Laterality: N/A;   VAGINAL HYSTERECTOMY  1978   partial    Family History  Problem Relation Age of Onset   Cancer Mother    Cancer Paternal Grandmother    Cancer Daughter    Cancer Paternal Aunt    Breast cancer Paternal Aunt    Diabetes Other    Hypertension Neg Hx     Social History:  reports that she has never smoked. She has never used smokeless tobacco. She reports that she  does not currently use alcohol. She reports that she does not use drugs.  Allergies:  Allergies  Allergen Reactions   Clindamycin Swelling   Penicillin G Swelling   Tape     paper   Latex Rash   Neosporin Original [Bacitracin-Neomycin-Polymyxin] Rash    Medications Prior to Admission  Medication Sig Dispense Refill Last Dose   acetaminophen (TYLENOL) 325 MG tablet Take 650 mg by mouth at bedtime.   08/02/2021   furosemide (LASIX) 20 MG tablet Take 20 mg by mouth daily.      gabapentin (NEURONTIN) 300 MG capsule Take 300 mg by mouth at  bedtime.   08/02/2021   glimepiride (AMARYL) 1 MG tablet Take 1 mg by mouth daily.   08/03/2021   metFORMIN (GLUCOPHAGE) 500 MG tablet Take 1,000 mg by mouth 2 (two) times daily.   08/03/2021   montelukast (SINGULAIR) 10 MG tablet Take 10 mg by mouth at bedtime.   08/02/2021   OVER THE COUNTER MEDICATION 1600 iu vitamin d daily   Past Week   PRESCRIPTION MEDICATION Vitamin b 12 shot q month   Past Month   simvastatin (ZOCOR) 20 MG tablet Take 20 mg by mouth daily.   08/03/2021   UNABLE TO FIND Med Name:calcium 600 mg bid   08/03/2021   UNABLE TO FIND Med Name: magnesium 250  mg bid   08/03/2021   celecoxib (CELEBREX) 200 MG capsule TAKE 1 CAPSULE EVERY DAY WITH FOOD      CONTOUR NEXT TEST test strip daily.      estradiol (ESTRACE) 0.1 MG/GM vaginal cream Place 0.5 g vaginally 2 (two) times a week. Place 0.5g nightly for two weeks then twice a week after 30 g 11    OVER THE COUNTER MEDICATION Immodium prn 1 to 2 tabs   Unknown   valsartan (DIOVAN) 80 MG tablet Take 80 mg by mouth daily.       Review of Systems  Constitutional:  Positive for appetite change and fatigue.  Respiratory:  Negative for shortness of breath.   Cardiovascular:  Positive for leg swelling. Negative for chest pain.  Gastrointestinal:  Negative for nausea.  Genitourinary:  Negative for difficulty urinating and vaginal bleeding.  Musculoskeletal:  Positive for arthralgias and neck pain.  Neurological:  Positive for tremors, weakness and headaches.  Psychiatric/Behavioral:  Negative for sleep disturbance.    Blood pressure (!) 141/77, pulse 79, temperature 98.5 F (36.9 C), temperature source Oral, resp. rate 18, height 5' 3"  (1.6 m), weight 74.4 kg, SpO2 100 %. Physical Exam Constitutional:      Appearance: Normal appearance.  HENT:     Head: Normocephalic and atraumatic.  Cardiovascular:     Rate and Rhythm: Normal rate.  Pulmonary:     Effort: Pulmonary effort is normal.  Abdominal:     Palpations: Abdomen  is soft.     Tenderness: There is no abdominal tenderness. There is no guarding or rebound.     Comments: Extensive ecchymosis on lower abdomen, dark purple in color. 6 laparoscopic incisions with skin glue. Right upper incision covered with clean and dry gauze.   Musculoskeletal:        General: Swelling present.  Skin:    Findings: Bruising present.  Neurological:     Mental Status: She is alert and oriented to person, place, and time.  Psychiatric:        Mood and Affect: Mood normal.        Behavior: Behavior normal.  Results for orders placed or performed during the hospital encounter of 08/03/21 (from the past 24 hour(s))  Prepare RBC (crossmatch)     Status: None   Collection Time: 08/03/21  2:57 PM  Result Value Ref Range   Order Confirmation      ORDER PROCESSED BY BLOOD BANK Performed at [facility] [address] [place], [place] [address]   Type and screen [place]     Status: None (Preliminary result)   Collection Time: 08/03/21  2:57 PM  Result Value Ref Range   ABO/RH(D) O POS    Antibody Screen NEG    Sample Expiration      08/06/2021,2359 Performed at [facility] [address], [place], [place] [address]    Unit Number [id number]    Blood Component Type RED CELLS,LR    Unit division 00    Status of Unit ALLOCATED    Transfusion Status OK TO TRANSFUSE    Crossmatch Result Compatible    Unit Number [id number]    Blood Component Type RED CELLS,LR    Unit division 00    Status of Unit ALLOCATED    Transfusion Status OK TO TRANSFUSE    Crossmatch Result Compatible   Resp Panel by RT-PCR (Flu A&B, Covid) Nasopharyngeal Swab     Status: None   Collection Time: 08/03/21  4:07 PM   Specimen: Nasopharyngeal Swab; Nasopharyngeal(NP) swabs in vial transport medium  Result Value Ref Range   SARS Coronavirus 2 by RT PCR NEGATIVE NEGATIVE   Influenza A by PCR NEGATIVE NEGATIVE   Influenza B by  PCR NEGATIVE NEGATIVE    CT ABDOMEN PELVIS W CONTRAST  Result Date: 08/03/2021 CLINICAL DATA:  75 year old female with acute abdominal pain postoperative day 7 status post surgery for vaginal prolapse after hysterectomy. Operative lysis of omental adhesions to the anterior midline abdominal wall. Sacrocolpopexy and cystoscopy. EXAM: CT ABDOMEN AND PELVIS WITH CONTRAST TECHNIQUE: Multidetector CT imaging of the abdomen and pelvis was performed using the standard protocol following bolus administration of intravenous contrast. CONTRAST:  70m OMNIPAQUE IOHEXOL 350 MG/ML SOLN COMPARISON:  None. FINDINGS: Lower chest: Calcified coronary artery atherosclerosis. Cardiac size at the upper limits of normal. No pericardial or pleural effusion. Minimal lung base atelectasis or scarring. Hepatobiliary: Cholelithiasis. Mixed density 22 mm gallstone. But no pericholecystic inflammation. Liver enhancement within normal limits. No bile duct enlargement. Pancreas: Negative. Spleen: Negative.  Incidental small splenule, normal variant. Adrenals/Urinary Tract: Normal adrenal glands. Symmetric renal enhancement with nonobstructed kidneys. Symmetric delayed renal contrast excretion. No hydroureter. No nephrolithiasis. Urinary bladder is diminutive with trace non dependent gas (series 2, image 86), but otherwise within normal limits. Stomach/Bowel: Retained stool in the distal large bowel. Sigmoid redundancy with occasional diverticula. Nondilated transverse colon, but nodularity in the transverse colon omentum with intermediate density more resembles omental caking than fluid but might be hematoma in the omentum (series 2, image 49). No free air identified. No free fluid in the abdomen. This finding is in proximity to a small right paraumbilical fat herniation measuring 24 mm diameter. And there is a superimposed midline overlying ventral abdominal incision. Evidence of postoperative changes to the cecum with suspected neo  terminal ileum. No convincing large bowel inflammation. No dilated small bowel loops. Small gastric hiatal hernia. Otherwise negative stomach and duodenum. Vascular/Lymphatic: Calcified aortic atherosclerosis. Major arterial structures in the abdomen and pelvis are patent. No lymphadenopathy. Portal venous system is patent. Reproductive: Small volume of gas within the  vagina. Surgically absent uterus. Ovaries are within normal limits. Other: Very small volume of free fluid in the pelvis, some with mildly complex fluid density. Trace presacral stranding Musculoskeletal: Advanced lumbar spine degeneration with mild scoliosis. No acute osseous abnormality identified. Patchy widespread ventral lower abdominal wall subcutaneous stranding. No abdominal wall fluid collection or gas. Small fat containing right paraumbilical hernia does not appear inflamed on series 2, image 48 and sagittal image 62. IMPRESSION: 1. Superficial abdominal wall inflammation compatible with recent surgery. Underlying a small fat containing paraumbilical hernia (2.4 cm) there is confluent mixed density within the greater omentum. This is most compatible with omental Hematoma following reported lysis of adhesions. There is no other hemoperitoneum, and no free air or free fluid in the abdomen. There is trace hemoperitoneum suspected in the pelvis. 2. Trace gas within the vagina. Vaginal cuff and ovaries are normal limits. 3. Prior postoperative changes also to the cecum with suspected neo terminal ileum. No bowel obstruction or convincing bowel inflammation. 4. Trace gas within the urinary bladder, might be related to recent catheterization but UTI not excluded. 5. Cholelithiasis without CT evidence of acute cholecystitis. 6. Calcified coronary artery atherosclerosis. Aortic Atherosclerosis (ICD10-I70.0). Electronically Signed   By: Genevie Ann M.D.   On: 08/03/2021 08:03    Assessment/Plan: 75 y.o. female s/p robotic sacrolpopexy, lysis of  adhesions and cystoscopy on 07/27/21 with symptomatic anemia from bleeding port site (now resolved).   CV- home BP meds held per PCP. Will order 20 lasix post transfusion.  Resp- no acute concerns, home singulair ordered FEN/ GI- carb controlled diet, KVO Endo- glucose checks 4x day, home metformin and glimiperide ordered Heme- Transfuse 2 units PRBC. Plan for repeat H/H after transfusion Neuro/ pain- tylenol 683m for pain as needed  MJaquita Folds10/18/2022, 5:56 PM

## 2021-08-04 DIAGNOSIS — E119 Type 2 diabetes mellitus without complications: Secondary | ICD-10-CM | POA: Diagnosis not present

## 2021-08-04 DIAGNOSIS — D6489 Other specified anemias: Secondary | ICD-10-CM | POA: Diagnosis not present

## 2021-08-04 DIAGNOSIS — Z85828 Personal history of other malignant neoplasm of skin: Secondary | ICD-10-CM | POA: Diagnosis not present

## 2021-08-04 DIAGNOSIS — Z20822 Contact with and (suspected) exposure to covid-19: Secondary | ICD-10-CM | POA: Diagnosis not present

## 2021-08-04 DIAGNOSIS — Z7984 Long term (current) use of oral hypoglycemic drugs: Secondary | ICD-10-CM | POA: Diagnosis not present

## 2021-08-04 DIAGNOSIS — Z8616 Personal history of COVID-19: Secondary | ICD-10-CM | POA: Diagnosis not present

## 2021-08-04 LAB — GLUCOSE, CAPILLARY: Glucose-Capillary: 127 mg/dL — ABNORMAL HIGH (ref 70–99)

## 2021-08-04 LAB — HEMOGLOBIN AND HEMATOCRIT, BLOOD
HCT: 30.7 % — ABNORMAL LOW (ref 36.0–46.0)
Hemoglobin: 10.1 g/dL — ABNORMAL LOW (ref 12.0–15.0)

## 2021-08-04 MED ORDER — FERROUS SULFATE 325 (65 FE) MG PO TABS: 325.0000 mg | ORAL_TABLET | Freq: Every day | ORAL | 2 refills | Status: AC

## 2021-08-04 NOTE — Discharge Summary (Signed)
Physician Discharge Summary   Patient ID: Mary Donovan [id number] 75 y.o. [birth year]  Admit date: 08/03/2021  Discharge date and time: 08/04/2021 8:20 AM  Admitting Physician: [name], MD   Discharge Physician: [name], MD   Admission Diagnoses: Anemia [D64.9]  Discharge Diagnoses: Anemia  Admission Condition: fair  Discharged Condition: good  Indication for Admission: Symptomatic anemia requiring blood transfusion  Hospital Course: Patient was admitted on 08/03/21 for symptomatic anemia and Hgb of 7.8 following Robotic sacrocolpopexy, LOA and cystoscopy on 07/27/21. She was noted to have abdominal ecchymosis and bleeding from a port site prior to admission. She was transfused 2 units of PRBC and symptoms of dizziness, headache and lethargy resolved. She was discharged on HD #2 after transfusion was complete.   Consults: None  Significant Diagnostic Studies: labs:  Lab Results  Component Value Date   WBC 11.5 (H) 08/02/2021   HGB 10.1 (L) 08/04/2021   HCT 30.7 (L) 08/04/2021   MCV 86 08/02/2021   PLT 409 08/02/2021     Treatments: transfusion of 2 units PRBC  Discharge Exam: BP 120/67 (BP Location: Right Arm)   Pulse 79   Temp 98.6 F (37 C) (Oral)   Resp 16   Ht 5' 3"  (1.6 m)   Wt 74.4 kg   SpO2 98%   BMI 29.06 kg/m   General Appearance:    Alert, cooperative, no distress, appears stated age  Head:    Normocephalic, without obvious abnormality, atraumatic  Lungs:     Clear to auscultation bilaterally, respirations unlabored  Chest Wall:    No tenderness or deformity   Heart:    Regular rate and rhythm, S1 and S2 normal, no murmur, rub   or gallop  Abdomen:     Soft, non-tender,    no masses, no organomegaly. 6 port sites clean and dry.   Extremities:   Extremities normal, atraumatic, no cyanosis. Mild 1+ LE edema  Skin:   Ecchymosis over lower abdomen    Disposition: Discharge to home  Patient Instructions:  Allergies as of  08/04/2021       Reactions   Tape    paper   Clindamycin Swelling, Rash   Facial swelling   Latex Rash   Neosporin Original [bacitracin-neomycin-polymyxin] Rash   Penicillin G Swelling, Rash   Facial swelling        Medication List     STOP taking these medications    celecoxib 200 MG capsule Commonly known as: CELEBREX   furosemide 20 MG tablet Commonly known as: LASIX   valsartan 80 MG tablet Commonly known as: DIOVAN       TAKE these medications    acetaminophen 325 MG tablet Commonly known as: TYLENOL Take 650 mg by mouth at bedtime.   Contour Next Test test strip Generic drug: glucose blood daily.   cyanocobalamin 1000 MCG/ML injection Commonly known as: (VITAMIN B-12) Inject 1,000 mcg into the muscle every 30 (thirty) days.   estradiol 0.1 MG/GM vaginal cream Commonly known as: ESTRACE Place 0.5 g vaginally 2 (two) times a week. Place 0.5g nightly for two weeks then twice a week after   ferrous sulfate 325 (65 FE) MG tablet Take 1 tablet (325 mg total) by mouth daily.   gabapentin 300 MG capsule Commonly known as: NEURONTIN Take 300 mg by mouth at bedtime.   glimepiride 1 MG tablet Commonly known as: AMARYL Take 2 mg by mouth daily.   metFORMIN 500 MG tablet Commonly known as:  GLUCOPHAGE Take 1,000 mg by mouth 2 (two) times daily.   montelukast 10 MG tablet Commonly known as: SINGULAIR Take 10 mg by mouth at bedtime.   OVER THE COUNTER MEDICATION Take 1 capsule by mouth daily. Calcium-vitamin D3 1600units   OVER THE COUNTER MEDICATION Take 2 mg by mouth daily as needed (for diarrhea). Immodium 20m   PRESCRIPTION MEDICATION Vitamin b 12 shot q month   simvastatin 20 MG tablet Commonly known as: ZOCOR Take 20 mg by mouth daily.   UNABLE TO FIND Take 600 mg by mouth 2 (two) times daily. Calcium 600 mg   UNABLE TO FIND Take 250 mg by mouth 2 (two) times daily. Magnesium 2575m      Activity: activity as tolerated and no  heavy lifting for 6 weeks Diet: diabetic diet Wound Care: keep wound clean and dry  Follow-up as scheduled with Dr ScWannetta Senderor post op check  Signed: MiJaquita Folds0/19/2022 8:20 AM

## 2021-08-04 NOTE — Plan of Care (Signed)
  Problem: Education: Goal: Knowledge of General Education information will improve Description: Including pain rating scale, medication(s)/side effects and non-pharmacologic comfort measures Outcome: Progressing   Problem: Clinical Measurements: Goal: Ability to maintain clinical measurements within normal limits will improve Outcome: Progressing Goal: Will remain free from infection Outcome: Progressing Goal: Diagnostic test results will improve Outcome: Progressing Goal: Respiratory complications will improve Outcome: Progressing Goal: Cardiovascular complication will be avoided Outcome: Progressing   Problem: Nutrition: Goal: Adequate nutrition will be maintained Outcome: Progressing   Problem: Elimination: Goal: Will not experience complications related to bowel motility Outcome: Progressing Goal: Will not experience complications related to urinary retention Outcome: Progressing   Problem: Pain Managment: Goal: General experience of comfort will improve Outcome: Progressing

## 2021-08-04 NOTE — Plan of Care (Signed)
  Problem: Education: Goal: Knowledge of General Education information will improve Description: Including pain rating scale, medication(s)/side effects and non-pharmacologic comfort measures Outcome: Adequate for Discharge   Problem: Clinical Measurements: Goal: Ability to maintain clinical measurements within normal limits will improve Outcome: Adequate for Discharge Goal: Will remain free from infection Outcome: Adequate for Discharge Goal: Diagnostic test results will improve Outcome: Adequate for Discharge Goal: Respiratory complications will improve Outcome: Adequate for Discharge Goal: Cardiovascular complication will be avoided Outcome: Adequate for Discharge   Problem: Nutrition: Goal: Adequate nutrition will be maintained Outcome: Adequate for Discharge   Problem: Elimination: Goal: Will not experience complications related to bowel motility Outcome: Adequate for Discharge Goal: Will not experience complications related to urinary retention Outcome: Adequate for Discharge   Problem: Pain Managment: Goal: General experience of comfort will improve Outcome: Adequate for Discharge

## 2021-08-04 NOTE — Progress Notes (Signed)
Pt was discharged home today. Instructions were reviewed with patient, and questions were answered. Pt was taken to main entrance via wheelchair by NT.  

## 2021-08-04 NOTE — Discharge Instructions (Signed)
Take BP at home and follow up with PCP regarding need to restart blood pressure medications.   Take 1 tab iron daily. A prescription was sent to your pharmacy.

## 2021-08-05 ENCOUNTER — Telehealth: Payer: Self-pay

## 2021-08-05 ENCOUNTER — Other Ambulatory Visit: Payer: Self-pay | Admitting: *Deleted

## 2021-08-05 LAB — TYPE AND SCREEN
ABO/RH(D): O POS
Antibody Screen: NEGATIVE
Unit division: 0
Unit division: 0

## 2021-08-05 LAB — BPAM RBC
Blood Product Expiration Date: 202211192359
Blood Product Expiration Date: 202211192359
ISSUE DATE / TIME: 202210181859
ISSUE DATE / TIME: 202210190132
Unit Type and Rh: 5100
Unit Type and Rh: 5100

## 2021-08-05 NOTE — Patient Outreach (Signed)
[place] [facility]) Care Management  08/05/2021  Mary Donovan 1946/02/22 [id number]    Referral Date: 10/20 Referral Source: EMMI Referral Reason: Chronic care management Insurance: DCE   Outreach attempt #1, successful.  Identity verified.  This care manager introduced self and stated purpose of call.  [facility] care management services explained.    Member familiar with program, state her husband was involved a few years ago.  Discussed some changes of program (no longer making home visits) as well as benefits.  State she was looking forward to visits, not sure if she would like only telephonic involvement.  Benefits again explained, requested to consider, she agrees and will let this care manager know if she would like to be involved.  Plan: RN CM will send outreach letter with brochure and this care manager's contact information.  Will follow up within the next week regarding decision on engagement.  Valente David, [place], MSN [place] [phone]

## 2021-08-06 ENCOUNTER — Encounter: Payer: Medicare Other | Admitting: Obstetrics and Gynecology

## 2021-08-12 ENCOUNTER — Other Ambulatory Visit: Payer: Self-pay | Admitting: *Deleted

## 2021-08-12 ENCOUNTER — Telehealth: Payer: Self-pay | Admitting: Obstetrics and Gynecology

## 2021-08-12 NOTE — Patient Outreach (Signed)
[facility]) Care Management  08/12/2021  Mary Donovan 1946-04-10 [id number]   Outgoing call placed to member to follow up on decision for [facility] engagement.  She declines at this time but stat she will contact this care manager if needs change. She is familiar with program and benefits.  Will notify PCP of case closure.  Valente David, [place], MSN [place] [phone]

## 2021-08-12 NOTE — Telephone Encounter (Signed)
Pt called stating she had some diarrhea last weekend and took some imodium but now has not had a bowel movement since. She does not feel like she needs to go but does not want to strain. She has miralax and wanted to confirm dose- advised to take 1 cap full daily and try that for a few days. She also started the iron last week and is taking daily. Recommended she take it every other day and that can help with the constipation.   [name], MD

## 2021-08-30 DIAGNOSIS — C44619 Basal cell carcinoma of skin of left upper limb, including shoulder: Secondary | ICD-10-CM | POA: Diagnosis not present

## 2021-08-30 DIAGNOSIS — C44319 Basal cell carcinoma of skin of other parts of face: Secondary | ICD-10-CM | POA: Diagnosis not present

## 2021-09-01 DIAGNOSIS — E538 Deficiency of other specified B group vitamins: Secondary | ICD-10-CM | POA: Diagnosis not present

## 2021-09-06 NOTE — Progress Notes (Signed)
[facility]  Date of Visit: 09/07/2021  History of Present Illness: Ms. Crandall is a 75 y.o. female scheduled today for a post-operative visit.   Surgery: s/p Robotic assisted sacrocolpopexy, lysis of adhesions and cystoscopy on 07/27/21  She passed her postoperative void trial.   Postoperative course complicated by anemia due to incisional bleeding/ hematoma. She was admitted and had two units PRBC transfused.  Today she reports she has noticed a bulge in the vagina. She has been very careful not to do anything strenuous or lifting.   UTI in the last 6 weeks? No  Pain? No  She has not returned to her normal activity- has limited her line Vaginal bulge? Yes  Stress incontinence: yes, some, but has been rare Urgency/frequency: No  Urge incontinence: No  Voiding dysfunction: No  Bowel issues:  diarrhea (normal due to her chrons)  Subjective Success: Do you usually have a bulge or something falling out that you can see or feel in the vaginal area? Yes  Retreatment Success: Any retreatment with surgery or pessary for any compartment? No     Medications: She has a current medication list which includes the following prescription(s): acetaminophen, contour next test, cyanocobalamin, estradiol, ferrous sulfate, gabapentin, glimepiride, metformin, montelukast, OVER THE COUNTER MEDICATION, OVER THE COUNTER MEDICATION, PRESCRIPTION MEDICATION, simvastatin, UNABLE TO FIND, and UNABLE TO FIND.   Allergies: Patient is allergic to tape, clindamycin, latex, neosporin original [bacitracin-neomycin-polymyxin], and penicillin g.   Physical Exam: BP (!) 159/82   Pulse 86   Wt 164 lb (74.4 kg)   BMI 29.05 kg/m   Abdomen: soft, non-tender, without masses or organomegaly Laparoscopic Incisions: healing well.  Pelvic Examination: Prolapse noted. Vaginal mucosa appears normal. No visible or palpable mesh or sutures.  POP-Q:  POP-Q  2                                            Aa    2                                           Ba  -7                                              C   5                                            Gh  4                                            Pb  8                                            tvl   -3  Ap  -3                                            Bp                                                 D    ---------------------------------------------------------  Assessment and Plan:  1. Prolapse of anterior vaginal wall     - She has recurrence of the anterior vaginal wall. We discussed that the mesh may have retracted from this area or detached from the sacrum to cause recurrence.  - We discussed the options of expectant management, pessary or repeat laparoscopy to identify the problem with the mesh. She would like to consider surgery again. Advised that we should wait an additional few months to recover, especially since her surgery was complicated by need for blood transfusion.  - Can resume regular activity.  Return 2 months for follow up  [name], MD

## 2021-09-07 ENCOUNTER — Encounter: Payer: Self-pay | Admitting: Obstetrics and Gynecology

## 2021-09-07 ENCOUNTER — Other Ambulatory Visit: Payer: Self-pay

## 2021-09-07 ENCOUNTER — Ambulatory Visit (INDEPENDENT_AMBULATORY_CARE_PROVIDER_SITE_OTHER): Payer: Medicare Other | Admitting: Obstetrics and Gynecology

## 2021-09-07 VITALS — BP 159/82 | HR 86 | Wt 164.0 lb

## 2021-09-07 DIAGNOSIS — N8110 Cystocele, unspecified: Secondary | ICD-10-CM

## 2021-09-17 DIAGNOSIS — C44612 Basal cell carcinoma of skin of right upper limb, including shoulder: Secondary | ICD-10-CM | POA: Diagnosis not present

## 2021-09-17 DIAGNOSIS — L905 Scar conditions and fibrosis of skin: Secondary | ICD-10-CM | POA: Diagnosis not present

## 2021-10-01 DIAGNOSIS — E538 Deficiency of other specified B group vitamins: Secondary | ICD-10-CM | POA: Diagnosis not present

## 2021-10-18 DIAGNOSIS — C44619 Basal cell carcinoma of skin of left upper limb, including shoulder: Secondary | ICD-10-CM | POA: Diagnosis not present

## 2021-10-18 DIAGNOSIS — L905 Scar conditions and fibrosis of skin: Secondary | ICD-10-CM | POA: Diagnosis not present

## 2021-11-02 DIAGNOSIS — E538 Deficiency of other specified B group vitamins: Secondary | ICD-10-CM | POA: Diagnosis not present

## 2021-11-09 ENCOUNTER — Ambulatory Visit (INDEPENDENT_AMBULATORY_CARE_PROVIDER_SITE_OTHER): Payer: Medicare Other | Admitting: Obstetrics and Gynecology

## 2021-11-09 ENCOUNTER — Encounter: Payer: Self-pay | Admitting: Obstetrics and Gynecology

## 2021-11-09 ENCOUNTER — Other Ambulatory Visit: Payer: Self-pay

## 2021-11-09 VITALS — BP 125/78 | HR 73

## 2021-11-09 DIAGNOSIS — N8110 Cystocele, unspecified: Secondary | ICD-10-CM

## 2021-11-09 DIAGNOSIS — N993 Prolapse of vaginal vault after hysterectomy: Secondary | ICD-10-CM | POA: Diagnosis not present

## 2021-11-09 NOTE — Progress Notes (Signed)
[name] Return Visit  SUBJECTIVE  History of Present Illness: Mary Donovan is a 76 y.o. female seen in follow-up for prolapse.   Surgery: s/p Robotic assisted sacrocolpopexy, lysis of adhesions and cystoscopy on 07/27/21 and anterior repair, sacroscopious ligament fixation, perineorrhaphy on 11/30/20  Postoperative course complicated by anemia due to incisional bleeding/ hematoma. She was admitted and had two units PRBC transfused.  She feels that the prolapse is back to where it was prior to both surgeries.   Past Medical History: Patient  has a past medical history of Arthritis, Complication of anesthesia, COVID (11/26/2020), Crohn disease ([place]), DM type 2 (diabetes mellitus, type 2) ([place]), Hypercholesterolemia, Hypertension, PONV (postoperative nausea and vomiting), Skin cancer, Uterine prolapse without vaginal wall prolapse, Vaginal Pap smear, abnormal (1978), and Vitamin B 12 deficiency.   Past Surgical History: She  has a past surgical history that includes Breast reduction surgery (1985); Illectomy (02/1998); left foot reconstruction (07/2011 left right dec 2012); Vaginal hysterectomy (1978); right big toe blister  drained (2013); right great toe wound care (2020); Appendectomy (1999); Perineoplasty (N/A, 12/21/2020); Anterior and posterior repair with sacrospinous fixation (N/A, 12/21/2020); Cystocele repair (N/A, 12/21/2020); Cystoscopy (N/A, 12/21/2020); Robotic assisted laparoscopic sacrocolpopexy (N/A, 07/27/2021); Cystoscopy (N/A, 07/27/2021); and Robotic assisted laparoscopic lysis of adhesion (07/27/2021).   Medications: She has a current medication list which includes the following prescription(s): acetaminophen, contour next test, cyanocobalamin, estradiol, ferrous sulfate, gabapentin, glimepiride, metformin, montelukast, OVER THE COUNTER MEDICATION, OVER THE COUNTER MEDICATION, PRESCRIPTION MEDICATION, simvastatin, UNABLE TO FIND, and UNABLE TO FIND.    Allergies: Patient is allergic to tape, clindamycin, latex, neosporin original [bacitracin-neomycin-polymyxin], and penicillin g.   Social History: Patient  reports that she has never smoked. She has never used smokeless tobacco. She reports that she does not currently use alcohol. She reports that she does not use drugs.      OBJECTIVE     Physical Exam: Vitals:   11/09/21 1256  BP: 125/78  Pulse: 73   Gen: No apparent distress, A&O x 3.  Detailed Urogynecologic Evaluation:  Normal external genitalia. Speculum exam reveals vaginal atrophy. No evidence of mesh erosion.   Prior exam showed: POP-Q  2                                            Aa   2                                           Ba  -5                                              C   5                                            Gh  4  Pb  8                                            tvl   -2.5                                            Ap  -2.5                                            Bp                                                 D      ASSESSMENT AND PLAN    Ms. Ballantine is a 77 y.o. with:  1. Prolapse of anterior vaginal wall   2. Vaginal vault prolapse after hysterectomy    Plan for surgery: Exam under anesthesia, revision robotic sacrocolpopexy, possible anterior repair, cystoscopy  - We reviewed the patient's specific anatomic and functional findings, with the assistance of diagrams, and together finalized the above procedure. The planned surgical procedures were discussed. Additional treatment options including expectant management, conservative management, medical management were discussed where appropriate.  We reviewed the benefits and risks of each treatment option.  - We reviewed that I will not know exactly why the procedure failed until I evaluate on the day of surgery. If the mesh detached, then its possible to reattach it  - For preop  Visit:  She is required to have a visit within 30 days of her surgery.    - Medical clearance: need updated A1c prior to surgery - Anticoagulant use: No - Medicaid Hysterectomy form: n/a - Accepts blood transfusion: Yes - Expected length of stay: observation  Request sent for surgery scheduling.   [name], MD  Time spent: I spent 25 minutes dedicated to the care of this patient on the date of this encounter to include pre-visit review of records, face-to-face time with the patient and post visit documentation.

## 2021-12-02 DIAGNOSIS — E538 Deficiency of other specified B group vitamins: Secondary | ICD-10-CM | POA: Diagnosis not present

## 2021-12-29 ENCOUNTER — Ambulatory Visit (INDEPENDENT_AMBULATORY_CARE_PROVIDER_SITE_OTHER): Payer: Medicare Other | Admitting: Obstetrics and Gynecology

## 2021-12-29 ENCOUNTER — Other Ambulatory Visit: Payer: Self-pay

## 2021-12-29 ENCOUNTER — Encounter: Payer: Self-pay | Admitting: Obstetrics and Gynecology

## 2021-12-29 VITALS — BP 163/86 | HR 68

## 2021-12-29 DIAGNOSIS — Z01818 Encounter for other preprocedural examination: Secondary | ICD-10-CM

## 2021-12-29 NOTE — Progress Notes (Signed)
[facility] ?Pre-Operative visit ? ?Subjective ?Chief Complaint: Mary Donovan presents for a preoperative encounter.  ? ?History of Present Illness: ?Mary Donovan is a 76 y.o. female who presents for preoperative visit.  She is scheduled to undergo Exam under anesthesia, revision robotic sacrocolpopexy, possible anterior repair, cystoscopy on 01/20/22.  Her symptoms include recurrent vaginal bulge, and she was was found to have Stage III anterior, Stage I posterior, Stage I apical prolapse ? ?S/p Robotic assisted sacrocolpopexy, lysis of adhesions and cystoscopy on 07/27/21 and anterior repair, sacroscopious ligament fixation, perineorrhaphy on 11/30/20 ?? Postoperative course complicated by anemia due to incisional bleeding/ hematoma. She was admitted and had two units PRBC transfused. ? ? ?Past Medical History:  ?Diagnosis Date  ? Arthritis   ? Complication of anesthesia   ? COVID 11/26/2020  ? asymptomatic  ? Crohn disease ([place])   ? DM type 2 (diabetes mellitus, type 2) ([place])   ? Hypercholesterolemia   ? Hypertension   ? PONV (postoperative nausea and vomiting)   ? slowo to wake up  ? Skin cancer   ? forehead and face basal cell carconoma removed  ? Uterine prolapse without vaginal wall prolapse   ? Vaginal Pap smear, abnormal 1978  ? hysterectomy  ? Vitamin B 12 deficiency   ?  ? ?Past Surgical History:  ?Procedure Laterality Date  ? ANTERIOR AND POSTERIOR REPAIR WITH SACROSPINOUS FIXATION N/A 12/21/2020  ? Procedure: SACROSPINOUS FIXATION;;  Surgeon: [name], MD;  Location: [facility];  Service: Gynecology;  Laterality: N/A;  total time of procedure 120 min; anterior repair only, no posterior  ? APPENDECTOMY  1999  ? with colon surgery  ? BREAST REDUCTION SURGERY  1985  ? CYSTOCELE REPAIR N/A 12/21/2020  ? Procedure: ANTERIOR REPAIR (CYSTOCELE);  Surgeon: [name], MD;  Location: [facility];  Service: Gynecology;  Laterality: N/A;  ? CYSTOSCOPY  N/A 12/21/2020  ? Procedure: CYSTOSCOPY;  Surgeon: [name], MD;  Location: [facility];  Service: Gynecology;  Laterality: N/A;  ? CYSTOSCOPY N/A 07/27/2021  ? Procedure: CYSTOSCOPY;  Surgeon: [name], MD;  Location: [facility];  Service: Gynecology;  Laterality: N/A;  ? Illectomy  02/1998  ? left foot reconstruction  07/2011 left right dec 2012  ? PERINEOPLASTY N/A 12/21/2020  ? Procedure: PERINEOPLASTY;  Surgeon: [name], MD;  Location: [facility];  Service: Gynecology;  Laterality: N/A;  ? right big toe blister  drained  2013  ? wound caree done  ? right great toe wound care  2020  ? ROBOTIC ASSISTED LAPAROSCOPIC LYSIS OF ADHESION  07/27/2021  ? Procedure: XI ROBOTIC ASSISTED LAPAROSCOPIC LYSIS OF ADHESION;  Surgeon: [name], MD;  Location: [facility];  Service: Gynecology;;  ? ROBOTIC ASSISTED LAPAROSCOPIC SACROCOLPOPEXY N/A 07/27/2021  ? Procedure: XI ROBOTIC ASSISTED LAPAROSCOPIC SACROCOLPOPEXY;  Surgeon: [name], MD;  Location: [facility];  Service: Gynecology;  Laterality: N/A;  ? VAGINAL HYSTERECTOMY  1978  ? partial  ? ? ?is allergic to tape, clindamycin, latex, neosporin original [bacitracin-neomycin-polymyxin], and penicillin g.  ? ?Family History  ?Problem Relation Age of Onset  ? Cancer Mother   ? Cancer Paternal Grandmother   ? Cancer Daughter   ? Cancer Paternal Aunt   ? Breast cancer Paternal Aunt   ? Diabetes Other   ? Hypertension Neg Hx   ? ? ?Social History  ? ?Tobacco  Use  ? Smoking status: Never  ? Smokeless tobacco: Never  ?Vaping Use  ? Vaping Use: Never used  ?Substance Use Topics  ? Alcohol use: Not Currently  ?  Comment: rare  ? Drug use: Never  ? ? ? ?Review of Systems was negative for a full 10 system review except as noted in the History of Present Illness. ? ? ?Current Outpatient Medications:  ?  acetaminophen (TYLENOL) 325 MG tablet, Take  650 mg by mouth at bedtime., Disp: , Rfl:  ?  CONTOUR NEXT TEST test strip, daily., Disp: , Rfl:  ?  cyanocobalamin (,VITAMIN B-12,) 1000 MCG/ML injection, Inject 1,000 mcg into the muscle every 30 (thirty) days., Disp: , Rfl:  ?  ferrous sulfate 325 (65 FE) MG tablet, Take 1 tablet (325 mg total) by mouth daily., Disp: 30 tablet, Rfl: 2 ?  gabapentin (NEURONTIN) 300 MG capsule, Take 300 mg by mouth at bedtime., Disp: , Rfl:  ?  glimepiride (AMARYL) 1 MG tablet, Take 2 mg by mouth daily., Disp: , Rfl:  ?  metFORMIN (GLUCOPHAGE) 500 MG tablet, Take 1,000 mg by mouth 2 (two) times daily., Disp: , Rfl:  ?  montelukast (SINGULAIR) 10 MG tablet, Take 10 mg by mouth at bedtime., Disp: , Rfl:  ?  OVER THE COUNTER MEDICATION, Take 1 capsule by mouth daily. Calcium-vitamin D3 1600units, Disp: , Rfl:  ?  OVER THE COUNTER MEDICATION, Take 2 mg by mouth daily as needed (for diarrhea). Immodium 41m, Disp: , Rfl:  ?  PRESCRIPTION MEDICATION, Vitamin b 12 shot q month, Disp: , Rfl:  ?  simvastatin (ZOCOR) 20 MG tablet, Take 20 mg by mouth daily., Disp: , Rfl:  ?  UNABLE TO FIND, Take 600 mg by mouth 2 (two) times daily. Calcium 600 mg, Disp: , Rfl:  ?  UNABLE TO FIND, Take 250 mg by mouth 2 (two) times daily. Magnesium 2530m Disp: , Rfl:   ? ?Objective ?Vitals:  ? 12/29/21 1451  ?BP: (!) 163/86  ?Pulse: 68  ? ? ?Gen: NAD ?CV: S1 S2 RRR ?Lungs: Clear to auscultation bilaterally ?Abd: soft, nontender ? ? ?Previous Pelvic Exam showed: ?POP-Q ?  ?2  ?                                          Aa   ?2 ?                                          Ba   ?-5  ?                                            C  ?  ?5  ?                                          Gh   ?4  ?  Pb   ?8  ?                                          tvl  ?  ?-2.5  ?                                          Ap   ?-2.5  ?                                          Bp   ?   ?                                            D  ?  ?   ? ? ? ?Assessment/ Plan ? ?Assessment: ?The patient is a 76 y.o. year old scheduled to undergo Exam under anesthesia, revision robotic sacrocolpopexy, possible anterior repair, cystoscopy. Verbal consent was obtained for these procedures. ? ?Plan: ?General Surgical Consent: ?The patient has previously been counseled on alternative treatments, and the decision by the patient and provider was to proceed with the procedure listed above. ? ?For all procedures, there are risks of bleeding, infection, damage to surrounding organs including but not limited to bowel, bladder, blood vessels, ureters and nerves, and need for further surgery if an injury were to occur. These risks are all low with minimally invasive surgery.  ? ?There are risks of numbness and weakness at any body site or buttock/rectal pain.  It is possible that baseline pain can be worsened by surgery, either with or without mesh. If surgery is vaginal, there is also a low risk of possible conversion to laparoscopy or open abdominal incision where indicated. Very rare risks include blood transfusion, blood clot, heart attack, pneumonia, or death.  ? ?There is also a risk of short-term postoperative urinary retention with need to use a catheter. About half of patients need to go home from surgery with a catheter, which is then later removed in the office. The risk of long-term need for a catheter is very low. There is also a risk of worsening of overactive bladder.  ? ? Prolapse (with or without mesh): ?Risk factors for surgical failure  include things that put pressure on your pelvis and the surgical repair, including obesity, chronic cough, and heavy lifting or straining (including lifting children or adults, straining on the toilet, or lifting heavy objects such as furniture or anything weighing >25 lbs. Risks of recurrence is 20-30% with vaginal native tissue repair and a less than 10% with sacrocolpopexy with mesh.   ? ?Sacrocolpopexy: ?Mesh implants  may provide more prolapse support, but do have some unique risks to consider. It is important to understand that mesh is permanent and cannot be easily removed. Risks of abdominal sacrocolpopexy mesh include mesh expo

## 2021-12-30 ENCOUNTER — Encounter: Payer: Self-pay | Admitting: Obstetrics and Gynecology

## 2021-12-30 NOTE — Progress Notes (Signed)
Surgery orders requested via Epic inbox. °

## 2021-12-30 NOTE — H&P (Signed)
[facility] ?Pre-Operative H&P ? ?Subjective ?Chief Complaint: Mary Donovan presents for a preoperative encounter.  ? ?History of Present Illness: ?Mary Donovan is a 76 y.o. female who presents for preoperative visit.  She is scheduled to undergo Exam under anesthesia, revision robotic sacrocolpopexy, possible anterior repair, cystoscopy on 01/20/22.  Her symptoms include recurrent vaginal bulge, and she was was found to have Stage III anterior, Stage I posterior, Stage I apical prolapse ? ?S/p Robotic assisted sacrocolpopexy, lysis of adhesions and cystoscopy on 07/27/21 and anterior repair, sacroscopious ligament fixation, perineorrhaphy on 11/30/20 ?? Postoperative course complicated by anemia due to incisional bleeding/ hematoma. She was admitted and had two units PRBC transfused. ? ? ?Past Medical History:  ?Diagnosis Date  ? Arthritis   ? Complication of anesthesia   ? COVID 11/26/2020  ? asymptomatic  ? Crohn disease ([place])   ? DM type 2 (diabetes mellitus, type 2) ([place])   ? Hypercholesterolemia   ? Hypertension   ? PONV (postoperative nausea and vomiting)   ? slowo to wake up  ? Skin cancer   ? forehead and face basal cell carconoma removed  ? Uterine prolapse without vaginal wall prolapse   ? Vaginal Pap smear, abnormal 1978  ? hysterectomy  ? Vitamin B 12 deficiency   ?  ? ?Past Surgical History:  ?Procedure Laterality Date  ? ANTERIOR AND POSTERIOR REPAIR WITH SACROSPINOUS FIXATION N/A 12/21/2020  ? Procedure: SACROSPINOUS FIXATION;;  Surgeon: [name], MD;  Location: [facility];  Service: Gynecology;  Laterality: N/A;  total time of procedure 120 min; anterior repair only, no posterior  ? APPENDECTOMY  1999  ? with colon surgery  ? BREAST REDUCTION SURGERY  1985  ? CYSTOCELE REPAIR N/A 12/21/2020  ? Procedure: ANTERIOR REPAIR (CYSTOCELE);  Surgeon: [name], MD;  Location: [facility];  Service: Gynecology;  Laterality: N/A;  ? CYSTOSCOPY N/A  12/21/2020  ? Procedure: CYSTOSCOPY;  Surgeon: [name], MD;  Location: [facility];  Service: Gynecology;  Laterality: N/A;  ? CYSTOSCOPY N/A 07/27/2021  ? Procedure: CYSTOSCOPY;  Surgeon: [name], MD;  Location: [facility];  Service: Gynecology;  Laterality: N/A;  ? Illectomy  02/1998  ? left foot reconstruction  07/2011 left right dec 2012  ? PERINEOPLASTY N/A 12/21/2020  ? Procedure: PERINEOPLASTY;  Surgeon: [name], MD;  Location: [facility];  Service: Gynecology;  Laterality: N/A;  ? right big toe blister  drained  2013  ? wound caree done  ? right great toe wound care  2020  ? ROBOTIC ASSISTED LAPAROSCOPIC LYSIS OF ADHESION  07/27/2021  ? Procedure: XI ROBOTIC ASSISTED LAPAROSCOPIC LYSIS OF ADHESION;  Surgeon: [name], MD;  Location: [facility];  Service: Gynecology;;  ? ROBOTIC ASSISTED LAPAROSCOPIC SACROCOLPOPEXY N/A 07/27/2021  ? Procedure: XI ROBOTIC ASSISTED LAPAROSCOPIC SACROCOLPOPEXY;  Surgeon: [name], MD;  Location: [facility];  Service: Gynecology;  Laterality: N/A;  ? VAGINAL HYSTERECTOMY  1978  ? partial  ? ? ?is allergic to tape, clindamycin, latex, neosporin original [bacitracin-neomycin-polymyxin], and penicillin g.  ? ?Family History  ?Problem Relation Age of Onset  ? Cancer Mother   ? Cancer Paternal Grandmother   ? Cancer Daughter   ? Cancer Paternal Aunt   ? Breast cancer Paternal Aunt   ? Diabetes Other   ? Hypertension Neg Hx   ? ? ?Social History  ? ?Tobacco  Use  ? Smoking status: Never  ? Smokeless tobacco: Never  ?Vaping Use  ? Vaping Use: Never used  ?Substance Use Topics  ? Alcohol use: Not Currently  ?  Comment: rare  ? Drug use: Never  ? ? ? ?Review of Systems was negative for a full 10 system review except as noted in the History of Present Illness. ? ?No current facility-administered medications for this encounter. ? ?Current Outpatient  Medications:  ?  acetaminophen (TYLENOL) 325 MG tablet, Take 650 mg by mouth at bedtime., Disp: , Rfl:  ?  CONTOUR NEXT TEST test strip, daily., Disp: , Rfl:  ?  cyanocobalamin (,VITAMIN B-12,) 1000 MCG/ML injection, Inject 1,000 mcg into the muscle every 30 (thirty) days., Disp: , Rfl:  ?  ferrous sulfate 325 (65 FE) MG tablet, Take 1 tablet (325 mg total) by mouth daily., Disp: 30 tablet, Rfl: 2 ?  gabapentin (NEURONTIN) 300 MG capsule, Take 300 mg by mouth at bedtime., Disp: , Rfl:  ?  glimepiride (AMARYL) 1 MG tablet, Take 2 mg by mouth daily., Disp: , Rfl:  ?  metFORMIN (GLUCOPHAGE) 500 MG tablet, Take 1,000 mg by mouth 2 (two) times daily., Disp: , Rfl:  ?  montelukast (SINGULAIR) 10 MG tablet, Take 10 mg by mouth at bedtime., Disp: , Rfl:  ?  OVER THE COUNTER MEDICATION, Take 1 capsule by mouth daily. Calcium-vitamin D3 1600units, Disp: , Rfl:  ?  OVER THE COUNTER MEDICATION, Take 2 mg by mouth daily as needed (for diarrhea). Immodium 27m, Disp: , Rfl:  ?  PRESCRIPTION MEDICATION, Vitamin b 12 shot q month, Disp: , Rfl:  ?  simvastatin (ZOCOR) 20 MG tablet, Take 20 mg by mouth daily., Disp: , Rfl:  ?  UNABLE TO FIND, Take 600 mg by mouth 2 (two) times daily. Calcium 600 mg, Disp: , Rfl:  ?  UNABLE TO FIND, Take 250 mg by mouth 2 (two) times daily. Magnesium 2581m Disp: , Rfl:   ? ?Objective ?There were no vitals filed for this visit. ? ? ?Gen: NAD ?CV: S1 S2 RRR ?Lungs: Clear to auscultation bilaterally ?Abd: soft, nontender ? ? ?Previous Pelvic Exam showed: ?POP-Q ?  ?2  ?                                          Aa   ?2 ?                                          Ba   ?-5  ?                                            C  ?  ?5  ?                                          Gh   ?4  ?  Pb   ?8  ?                                          tvl  ?  ?-2.5  ?                                          Ap   ?-2.5  ?                                          Bp   ?   ?                                             D  ?  ?  ? ? ? ?Assessment/ Plan ? ? ?The patient is a 76 y.o. year old with recurrent stage III POP scheduled to undergo Exam under anesthesia, revision robotic sacrocolpopexy, possible anterior repair, cystoscopy.  ? ?[name], MD ? ? ? ? ?

## 2021-12-31 DIAGNOSIS — E538 Deficiency of other specified B group vitamins: Secondary | ICD-10-CM | POA: Diagnosis not present

## 2022-01-07 NOTE — Progress Notes (Addendum)
COVID Vaccine Completed: yes x 4 ?Date COVID Vaccine completed: 03/10/20, 04/08/20 ?Has received booster: 10/28/20, 03/24/21 ?COVID vaccine manufacturer: [place]  ? ?Date of COVID positive in last 54 days:no ? ?PCP - Lujean Amel, MD ?Cardiologist - n/a ? ?Chest x-ray - n/a ?EKG - 01/10/21 Epic ?Stress Test - n/a ?ECHO - n/a ?Cardiac Cath - n/a ?Pacemaker/ICD device last checked: n/a ?Spinal Cord Stimulator: n/a ? ?Bowel Prep - no ? ?Sleep Study - n/a ?CPAP -  ? ?Fasting Blood Sugar - 110-130s ?Checks Blood Sugar 1 times a day ? ?Blood Thinner Instructions: n/a ?Aspirin Instructions: ?Last Dose: ? ?Activity level: Can go up a flight of stairs and perform activities of daily living without stopping and without symptoms of chest pain or shortness of breath. ?   ?Anesthesia review:  ? ?Patient denies shortness of breath, fever, cough and chest pain at PAT appointment ? ? ?Patient verbalized understanding of instructions that were given to them at the PAT appointment. Patient was also instructed that they will need to review over the PAT instructions again at home before surgery.  ?

## 2022-01-07 NOTE — Patient Instructions (Addendum)
DUE TO COVID-19 ONLY ONE VISITOR  (aged 75 and older)  IS ALLOWED TO COME WITH YOU AND STAY IN THE WAITING ROOM ONLY DURING PRE OP AND PROCEDURE.   ?**NO VISITORS ARE ALLOWED IN THE SHORT STAY AREA OR RECOVERY ROOM!!** ? ?IF YOU WILL BE ADMITTED INTO THE HOSPITAL YOU ARE ALLOWED ONLY TWO SUPPORT PEOPLE DURING VISITATION HOURS ONLY (7 AM -8PM)   ?The support person(s) must pass our screening, gel in and out, and wear a mask at all times, including in the patient?s room. ?Patients must also wear a mask when staff or their support person are in the room. ?Visitors GUEST BADGE MUST BE WORN VISIBLY  ?One adult visitor may remain with you overnight and MUST be in the room by 8 P.M. ?  ? ? Your procedure is scheduled on: 01/20/22 ? ? Report to [facility] Main Entrance ? ?  Report to admitting at 9:20 AM ? ? Call this number if you have problems the morning of surgery [phone] ? ? Do not eat food :After Midnight. ? ? After Midnight you may have the following liquids until 9:00 AM/ PM DAY OF SURGERY ? ?Water ?Black Coffee (sugar ok, NO MILK/CREAM OR CREAMERS)  ?Tea (sugar ok, NO MILK/CREAM OR CREAMERS) regular and decaf                             ?Plain Jell-O (NO RED)                                           ?Fruit ices (not with fruit pulp, NO RED)                                     ?Popsicles (NO RED)                                                                  ?Juice: apple, WHITE grape, WHITE cranberry ?Sports drinks like Gatorade (NO RED) ?Clear broth(vegetable,chicken,beef) ? ?FOLLOW BOWEL PREP AND ANY ADDITIONAL PRE OP INSTRUCTIONS YOU RECEIVED FROM YOUR SURGEON'S OFFICE!!! ?  ?  ?Oral Hygiene is also important to reduce your risk of infection.                                    ?Remember - BRUSH YOUR TEETH THE MORNING OF SURGERY WITH YOUR REGULAR TOOTHPASTE ? ? Take these medicines the morning of surgery with A SIP OF WATER: Simvastatin  ? ?DO NOT TAKE ANY ORAL DIABETIC MEDICATIONS DAY OF YOUR  SURGERY ? ?How to Manage Your Diabetes ?Before and After Surgery ? ?Why is it important to control my blood sugar before and after surgery? ?Improving blood sugar levels before and after surgery helps healing and can limit problems. ?A way of improving blood sugar control is eating a healthy diet by: ? Eating less sugar and carbohydrates ? Increasing activity/exercise ? Talking with your doctor about reaching your blood sugar goals ?  High blood sugars (greater than 180 mg/dL) can raise your risk of infections and slow your recovery, so you will need to focus on controlling your diabetes during the weeks before surgery. ?Make sure that the doctor who takes care of your diabetes knows about your planned surgery including the date and location. ? ?How do I manage my blood sugar before surgery? ?Check your blood sugar at least 4 times a day, starting 2 days before surgery, to make sure that the level is not too high or low. ?Check your blood sugar the morning of your surgery when you wake up and every 2 hours until you get to the Short Stay unit. ?If your blood sugar is less than 70 mg/dL, you will need to treat for low blood sugar: ?Do not take insulin. ?Treat a low blood sugar (less than 70 mg/dL) with ? cup of clear juice (cranberry or apple), 4 glucose tablets, OR glucose gel. ?Recheck blood sugar in 15 minutes after treatment (to make sure it is greater than 70 mg/dL). If your blood sugar is not greater than 70 mg/dL on recheck, call [phone] for further instructions. ?Report your blood sugar to the short stay nurse when you get to Short Stay. ? ?If you are admitted to the hospital after surgery: ?Your blood sugar will be checked by the staff and you will probably be given insulin after surgery (instead of oral diabetes medicines) to make sure you have good blood sugar levels. ?The goal for blood sugar control after surgery is 80-180 mg/dL. ? ? ?WHAT DO I DO ABOUT MY DIABETES MEDICATION? ? ?Do not take oral  diabetes medicines (pills) the morning of surgery. ? ?THE DAY BEFORE SURGERY, take Metformin as prescribed. Only take morning dose of Glimepiride, do not take afternoon/evening dose.   ? ?THE MORNING OF SURGERY, do not take Metformin or Glimepiride. ? ?Reviewed and Endorsed by [facility] Patient Education Committee, August 2015  ?                  ?           You may not have any metal on your body including hair pins, jewelry, and body piercing ? ?           Do not wear make-up, lotions, powders, perfumes, or deodorant ? ?Do not wear nail polish including gel and S&S, artificial/acrylic nails, or any other type of covering on natural nails including finger and toenails. If you have artificial nails, gel coating, etc. that needs to be removed by a nail salon please have this removed prior to surgery or surgery may need to be canceled/ delayed if the surgeon/ anesthesia feels like they are unable to be safely monitored.  ? ?Do not shave  48 hours prior to surgery.  ? ? Do not bring valuables to the hospital. [facility] NOT ?            RESPONSIBLE   FOR VALUABLES. ? ? Bring small overnight bag day of surgery. ?  ? Special Instructions: Bring a copy of your healthcare power of attorney and living will documents         the day of surgery if you haven't scanned them before. ? ?            Please read over the following fact sheets you were given: IF [place] [phone]- Apolonio Schneiders ? ?   [facility] - Preparing for Surgery ?  Before surgery, you can play an important role.  Because skin is not sterile, your skin needs to be as free of germs as possible.  You can reduce the number of germs on your skin by washing with CHG (chlorahexidine gluconate) soap before surgery.  CHG is an antiseptic cleaner which kills germs and bonds with the skin to continue killing germs even after washing. ?Please DO NOT use if you have an allergy to CHG or antibacterial soaps.  If your  skin becomes reddened/irritated stop using the CHG and inform your nurse when you arrive at Short Stay. ?Do not shave (including legs and underarms) for at least 48 hours prior to the first CHG shower.  You may shave your face/neck. ? ?Please follow these instructions carefully: ? 1.  Shower with CHG Soap the night before surgery and the  morning of surgery. ? 2.  If you choose to wash your hair, wash your hair first as usual with your normal  shampoo. ? 3.  After you shampoo, rinse your hair and body thoroughly to remove the shampoo.                            ? 4.  Use CHG as you would any other liquid soap.  You can apply chg directly to the skin and wash.  Gently with a scrungie or clean washcloth. ? 5.  Apply the CHG Soap to your body ONLY FROM THE NECK DOWN.   Do   not use on face/ open      ?                     Wound or open sores. Avoid contact with eyes, ears mouth and   genitals (private parts).  ?                     Production manager,  Genitals (private parts) with your normal soap. ?            6.  Wash thoroughly, paying special attention to the area where your    surgery  will be performed. ? 7.  Thoroughly rinse your body with warm water from the neck down. ? 8.  DO NOT shower/wash with your normal soap after using and rinsing off the CHG Soap. ?               9.  Pat yourself dry with a clean towel. ?           10.  Wear clean pajamas. ?           11.  Place clean sheets on your bed the night of your first shower and do not  sleep with pets. ?Day of Surgery : ?Do not apply any lotions/deodorants the morning of surgery.  Please wear clean clothes to the hospital/surgery center. ? ?FAILURE TO FOLLOW THESE INSTRUCTIONS MAY RESULT IN THE CANCELLATION OF YOUR SURGERY ? ?PATIENT SIGNATURE_________________________________ ? ?NURSE SIGNATURE__________________________________ ? ?________________________________________________________________________  ?WHAT IS A BLOOD TRANSFUSION? Blood Transfusion Information ? ?A  transfusion is the replacement of blood or some of its parts. Blood is made up of multiple cells which provide different functions. ?Red blood cells carry oxygen and are used for blood loss replacement. ?Whit

## 2022-01-10 ENCOUNTER — Encounter (HOSPITAL_COMMUNITY)
Admission: RE | Admit: 2022-01-10 | Discharge: 2022-01-10 | Disposition: A | Discharge status: Home / Self Care | Payer: Medicare Other | Source: Ambulatory Visit | Attending: Obstetrics and Gynecology | Admitting: Obstetrics and Gynecology

## 2022-01-10 ENCOUNTER — Other Ambulatory Visit: Payer: Self-pay

## 2022-01-10 ENCOUNTER — Encounter (HOSPITAL_COMMUNITY): Payer: Self-pay

## 2022-01-10 VITALS — BP 148/76 | HR 74 | Temp 98.7°F | Resp 16 | Ht 63.0 in | Wt 163.0 lb

## 2022-01-10 DIAGNOSIS — N8110 Cystocele, unspecified: Secondary | ICD-10-CM | POA: Diagnosis not present

## 2022-01-10 DIAGNOSIS — N993 Prolapse of vaginal vault after hysterectomy: Secondary | ICD-10-CM | POA: Diagnosis not present

## 2022-01-10 DIAGNOSIS — Z01818 Encounter for other preprocedural examination: Secondary | ICD-10-CM | POA: Diagnosis not present

## 2022-01-10 DIAGNOSIS — D509 Iron deficiency anemia, unspecified: Secondary | ICD-10-CM | POA: Insufficient documentation

## 2022-01-10 DIAGNOSIS — E119 Type 2 diabetes mellitus without complications: Secondary | ICD-10-CM | POA: Diagnosis not present

## 2022-01-10 DIAGNOSIS — D508 Other iron deficiency anemias: Secondary | ICD-10-CM

## 2022-01-10 HISTORY — DX: Other seasonal allergic rhinitis: J30.2

## 2022-01-10 HISTORY — DX: Pneumonia, unspecified organism: J18.9

## 2022-01-10 LAB — CBC
HCT: 39.2 % (ref 36.0–46.0)
Hemoglobin: 12.6 g/dL (ref 12.0–15.0)
MCH: 28.3 pg (ref 26.0–34.0)
MCHC: 32.1 g/dL (ref 30.0–36.0)
MCV: 87.9 fL (ref 80.0–100.0)
Platelets: 331 10*3/uL (ref 150–400)
RBC: 4.46 MIL/uL (ref 3.87–5.11)
RDW: 14.5 % (ref 11.5–15.5)
WBC: 8.4 10*3/uL (ref 4.0–10.5)
nRBC: 0 % (ref 0.0–0.2)

## 2022-01-10 LAB — BASIC METABOLIC PANEL
Anion gap: 11 (ref 5–15)
BUN: 25 mg/dL — ABNORMAL HIGH (ref 8–23)
CO2: 22 mmol/L (ref 22–32)
Calcium: 9.3 mg/dL (ref 8.9–10.3)
Chloride: 106 mmol/L (ref 98–111)
Creatinine, Ser: 0.58 mg/dL (ref 0.44–1.00)
GFR, Estimated: >60 mL/min (ref >60)
Glucose, Bld: 100 mg/dL — ABNORMAL HIGH (ref 70–99)
Potassium: 4.1 mmol/L (ref 3.5–5.1)
Sodium: 139 mmol/L (ref 135–145)

## 2022-01-10 LAB — GLUCOSE, CAPILLARY: Glucose-Capillary: 96 mg/dL (ref 70–99)

## 2022-01-10 LAB — HEMOGLOBIN A1C
Hgb A1c MFr Bld: 6.1 % — ABNORMAL HIGH (ref 4.8–5.6)
Mean Plasma Glucose: 128.37 mg/dL

## 2022-01-12 ENCOUNTER — Encounter: Payer: Self-pay | Admitting: Obstetrics and Gynecology

## 2022-01-17 ENCOUNTER — Telehealth: Payer: Self-pay | Admitting: Obstetrics and Gynecology

## 2022-01-19 NOTE — Progress Notes (Signed)
Spoke to patient and gave her updated arrival time of 1045 for surgery on 01-28-22.  Patient was reminded no solid food after midnight and that she can have clear liquids from midnight until 1000 day of surgery.  Patient stated no change in medical history since preop visit.  All questions answered and patient stated understanding. ?

## 2022-01-20 DIAGNOSIS — N8110 Cystocele, unspecified: Secondary | ICD-10-CM

## 2022-01-20 DIAGNOSIS — E119 Type 2 diabetes mellitus without complications: Secondary | ICD-10-CM

## 2022-01-20 DIAGNOSIS — D508 Other iron deficiency anemias: Secondary | ICD-10-CM

## 2022-01-20 DIAGNOSIS — N993 Prolapse of vaginal vault after hysterectomy: Secondary | ICD-10-CM | POA: Diagnosis present

## 2022-01-20 LAB — TYPE AND SCREEN
ABO/RH(D): O POS
Antibody Screen: NEGATIVE

## 2022-01-28 ENCOUNTER — Other Ambulatory Visit: Payer: Self-pay

## 2022-01-28 ENCOUNTER — Encounter (HOSPITAL_COMMUNITY)
Admission: RE | Disposition: A | Discharge status: Home / Self Care | Payer: Self-pay | Source: Ambulatory Visit | Attending: Obstetrics and Gynecology

## 2022-01-28 ENCOUNTER — Ambulatory Visit (HOSPITAL_BASED_OUTPATIENT_CLINIC_OR_DEPARTMENT_OTHER): Payer: Medicare Other | Admitting: Anesthesiology

## 2022-01-28 ENCOUNTER — Ambulatory Visit (HOSPITAL_COMMUNITY): Payer: Medicare Other | Admitting: Anesthesiology

## 2022-01-28 ENCOUNTER — Ambulatory Visit (HOSPITAL_COMMUNITY)
Admission: RE | Admit: 2022-01-28 | Discharge: 2022-01-29 | Disposition: A | Discharge status: Home / Self Care | Payer: Medicare Other | Source: Ambulatory Visit | Attending: Obstetrics and Gynecology | Admitting: Obstetrics and Gynecology

## 2022-01-28 ENCOUNTER — Encounter (HOSPITAL_COMMUNITY): Payer: Self-pay | Admitting: Obstetrics and Gynecology

## 2022-01-28 DIAGNOSIS — D508 Other iron deficiency anemias: Secondary | ICD-10-CM

## 2022-01-28 DIAGNOSIS — N993 Prolapse of vaginal vault after hysterectomy: Secondary | ICD-10-CM

## 2022-01-28 DIAGNOSIS — E119 Type 2 diabetes mellitus without complications: Secondary | ICD-10-CM | POA: Diagnosis not present

## 2022-01-28 DIAGNOSIS — Z01818 Encounter for other preprocedural examination: Secondary | ICD-10-CM

## 2022-01-28 DIAGNOSIS — Z7984 Long term (current) use of oral hypoglycemic drugs: Secondary | ICD-10-CM | POA: Diagnosis not present

## 2022-01-28 DIAGNOSIS — N8110 Cystocele, unspecified: Secondary | ICD-10-CM

## 2022-01-28 DIAGNOSIS — N738 Other specified female pelvic inflammatory diseases: Secondary | ICD-10-CM | POA: Diagnosis not present

## 2022-01-28 HISTORY — PX: CYSTOSCOPY: SHX5120

## 2022-01-28 HISTORY — PX: ROBOTIC ASSISTED LAPAROSCOPIC SACROCOLPOPEXY: SHX5388

## 2022-01-28 LAB — GLUCOSE, CAPILLARY
Glucose-Capillary: 115 mg/dL — ABNORMAL HIGH (ref 70–99)
Glucose-Capillary: 143 mg/dL — ABNORMAL HIGH (ref 70–99)
Glucose-Capillary: 202 mg/dL — ABNORMAL HIGH (ref 70–99)

## 2022-01-28 LAB — TYPE AND SCREEN
ABO/RH(D): O POS
Antibody Screen: NEGATIVE

## 2022-01-28 SURGERY — SACROCOLPOPEXY, ROBOT-ASSISTED, LAPAROSCOPIC
Anesthesia: General

## 2022-01-28 MED ORDER — SUCCINYLCHOLINE CHLORIDE 200 MG/10ML IV SOSY
PREFILLED_SYRINGE | INTRAVENOUS | Status: AC
  Filled 2022-01-28: qty 10

## 2022-01-28 MED ORDER — CIPROFLOXACIN IN D5W 400 MG/200ML IV SOLN
400.0000 mg | INTRAVENOUS | Status: AC
  Administered 2022-01-28: 400 mg via INTRAVENOUS
  Filled 2022-01-28: qty 200

## 2022-01-28 MED ORDER — ONDANSETRON HCL 4 MG/2ML IJ SOLN
INTRAMUSCULAR | Status: DC | PRN
  Administered 2022-01-28: 4 mg via INTRAVENOUS

## 2022-01-28 MED ORDER — INSULIN ASPART 100 UNIT/ML IJ SOLN
0.0000 [IU] | Freq: Three times a day (TID) | INTRAMUSCULAR | Status: DC
  Administered 2022-01-29: 1 [IU] via SUBCUTANEOUS

## 2022-01-28 MED ORDER — AMISULPRIDE (ANTIEMETIC) 5 MG/2ML IV SOLN: 10.0000 mg | Freq: Once | INTRAVENOUS | Status: DC | PRN

## 2022-01-28 MED ORDER — CELECOXIB 200 MG PO CAPS
ORAL_CAPSULE | ORAL | Status: AC
  Filled 2022-01-28: qty 1

## 2022-01-28 MED ORDER — FUROSEMIDE 20 MG PO TABS
20.0000 mg | ORAL_TABLET | Freq: Every morning | ORAL | Status: DC
  Administered 2022-01-29: 20 mg via ORAL
  Filled 2022-01-28: qty 1

## 2022-01-28 MED ORDER — LACTATED RINGERS IV SOLN
INTRAVENOUS | Status: DC
Start: 2022-01-28 — End: 2022-01-28

## 2022-01-28 MED ORDER — POVIDONE-IODINE 10 % EX SWAB
2.0000 | Freq: Once | CUTANEOUS | Status: DC
Start: 2022-01-28 — End: 2022-01-28

## 2022-01-28 MED ORDER — HEPARIN SODIUM (PORCINE) 5000 UNIT/ML IJ SOLN
5000.0000 [IU] | INTRAMUSCULAR | Status: AC
  Administered 2022-01-28: 5000 [IU] via SUBCUTANEOUS
  Filled 2022-01-28: qty 1

## 2022-01-28 MED ORDER — LIDOCAINE HCL (PF) 2 % IJ SOLN
INTRAMUSCULAR | Status: DC | PRN
  Administered 2022-01-28: 1.5 mg/kg/h via INTRADERMAL

## 2022-01-28 MED ORDER — SUGAMMADEX SODIUM 200 MG/2ML IV SOLN
INTRAVENOUS | Status: DC | PRN
  Administered 2022-01-28: 200 mg via INTRAVENOUS

## 2022-01-28 MED ORDER — DOCUSATE SODIUM 100 MG PO CAPS
100.0000 mg | ORAL_CAPSULE | Freq: Two times a day (BID) | ORAL | Status: DC
  Filled 2022-01-28 (×2): qty 1

## 2022-01-28 MED ORDER — FENTANYL CITRATE (PF) 250 MCG/5ML IJ SOLN
INTRAMUSCULAR | Status: AC
  Filled 2022-01-28: qty 5

## 2022-01-28 MED ORDER — ACETAMINOPHEN 325 MG PO TABS
650.0000 mg | ORAL_TABLET | ORAL | Status: DC | PRN
  Filled 2022-01-28 (×2): qty 2

## 2022-01-28 MED ORDER — ACETAMINOPHEN 500 MG PO TABS
1000.0000 mg | ORAL_TABLET | Freq: Once | ORAL | Status: AC
  Administered 2022-01-28: 1000 mg via ORAL

## 2022-01-28 MED ORDER — BUPIVACAINE HCL (PF) 0.25 % IJ SOLN
INTRAMUSCULAR | Status: AC
  Filled 2022-01-28: qty 30

## 2022-01-28 MED ORDER — PROPOFOL 10 MG/ML IV BOLUS
INTRAVENOUS | Status: AC
  Filled 2022-01-28: qty 20

## 2022-01-28 MED ORDER — BUPIVACAINE HCL (PF) 0.25 % IJ SOLN
INTRAMUSCULAR | Status: DC | PRN
  Administered 2022-01-28: 10 mL

## 2022-01-28 MED ORDER — ONDANSETRON HCL 4 MG/2ML IJ SOLN
INTRAMUSCULAR | Status: AC
  Filled 2022-01-28: qty 2

## 2022-01-28 MED ORDER — FENTANYL CITRATE PF 50 MCG/ML IJ SOSY: 25.0000 ug | PREFILLED_SYRINGE | INTRAMUSCULAR | Status: DC | PRN

## 2022-01-28 MED ORDER — ENOXAPARIN SODIUM 40 MG/0.4ML IJ SOSY
40.0000 mg | PREFILLED_SYRINGE | INTRAMUSCULAR | Status: DC
  Administered 2022-01-29: 40 mg via SUBCUTANEOUS
  Filled 2022-01-28: qty 0.4

## 2022-01-28 MED ORDER — MAGNESIUM OXIDE -MG SUPPLEMENT 400 (240 MG) MG PO TABS
400.0000 mg | ORAL_TABLET | Freq: Every day | ORAL | Status: DC
  Administered 2022-01-29: 400 mg via ORAL
  Filled 2022-01-28 (×2): qty 1

## 2022-01-28 MED ORDER — ONDANSETRON HCL 4 MG/2ML IJ SOLN: 4.0000 mg | Freq: Four times a day (QID) | INTRAMUSCULAR | Status: DC | PRN

## 2022-01-28 MED ORDER — CELECOXIB 200 MG PO CAPS
200.0000 mg | ORAL_CAPSULE | Freq: Two times a day (BID) | ORAL | Status: DC | PRN
  Filled 2022-01-28: qty 1

## 2022-01-28 MED ORDER — LIDOCAINE HCL (PF) 1 % IJ SOLN
INTRAMUSCULAR | Status: AC
  Filled 2022-01-28: qty 30

## 2022-01-28 MED ORDER — LACTATED RINGERS IV SOLN: INTRAVENOUS | Status: DC | PRN

## 2022-01-28 MED ORDER — ALBUTEROL SULFATE HFA 108 (90 BASE) MCG/ACT IN AERS
INHALATION_SPRAY | RESPIRATORY_TRACT | Status: AC
  Filled 2022-01-28: qty 6.7

## 2022-01-28 MED ORDER — SIMETHICONE 80 MG PO CHEW: 80.0000 mg | CHEWABLE_TABLET | Freq: Four times a day (QID) | ORAL | Status: DC | PRN

## 2022-01-28 MED ORDER — ACETAMINOPHEN 500 MG PO TABS
ORAL_TABLET | ORAL | Status: AC
  Filled 2022-01-28: qty 2

## 2022-01-28 MED ORDER — CELECOXIB 200 MG PO CAPS
200.0000 mg | ORAL_CAPSULE | Freq: Once | ORAL | Status: AC
Start: 2022-01-28 — End: 2022-01-28
  Administered 2022-01-28: 200 mg via ORAL

## 2022-01-28 MED ORDER — DEXAMETHASONE SODIUM PHOSPHATE 10 MG/ML IJ SOLN
INTRAMUSCULAR | Status: DC | PRN
  Administered 2022-01-28: 10 mg via INTRAVENOUS

## 2022-01-28 MED ORDER — LIDOCAINE 2% (20 MG/ML) 5 ML SYRINGE
INTRAMUSCULAR | Status: DC | PRN
  Administered 2022-01-28: 70 mg via INTRAVENOUS

## 2022-01-28 MED ORDER — PROPOFOL 10 MG/ML IV BOLUS
INTRAVENOUS | Status: DC | PRN
  Administered 2022-01-28: 150 mg via INTRAVENOUS

## 2022-01-28 MED ORDER — ALBUTEROL SULFATE HFA 108 (90 BASE) MCG/ACT IN AERS
INHALATION_SPRAY | RESPIRATORY_TRACT | Status: DC | PRN
  Administered 2022-01-28: 7 via RESPIRATORY_TRACT

## 2022-01-28 MED ORDER — METFORMIN HCL 500 MG PO TABS
1000.0000 mg | ORAL_TABLET | Freq: Two times a day (BID) | ORAL | Status: DC
Start: 2022-01-29 — End: 2022-01-29
  Administered 2022-01-29: 1000 mg via ORAL
  Filled 2022-01-28: qty 2

## 2022-01-28 MED ORDER — ROCURONIUM BROMIDE 10 MG/ML (PF) SYRINGE
PREFILLED_SYRINGE | INTRAVENOUS | Status: AC
  Filled 2022-01-28: qty 10

## 2022-01-28 MED ORDER — SIMVASTATIN 20 MG PO TABS
20.0000 mg | ORAL_TABLET | Freq: Every morning | ORAL | Status: DC
  Administered 2022-01-29: 20 mg via ORAL
  Filled 2022-01-28: qty 1

## 2022-01-28 MED ORDER — ROCURONIUM BROMIDE 10 MG/ML (PF) SYRINGE
PREFILLED_SYRINGE | INTRAVENOUS | Status: DC | PRN
  Administered 2022-01-28: 30 mg via INTRAVENOUS
  Administered 2022-01-28: 20 mg via INTRAVENOUS
  Administered 2022-01-28: 100 mg via INTRAVENOUS

## 2022-01-28 MED ORDER — SODIUM CHLORIDE 0.9 % IR SOLN
Status: DC | PRN
  Administered 2022-01-28: 1000 mL

## 2022-01-28 MED ORDER — SURGIFLO WITH THROMBIN (HEMOSTATIC MATRIX KIT) OPTIME
TOPICAL | Status: DC | PRN
  Administered 2022-01-28: 1 via TOPICAL

## 2022-01-28 MED ORDER — PHENYLEPHRINE HCL (PRESSORS) 10 MG/ML IV SOLN
INTRAVENOUS | Status: AC
  Filled 2022-01-28: qty 1

## 2022-01-28 MED ORDER — DIPHENHYDRAMINE HCL 50 MG/ML IJ SOLN
INTRAMUSCULAR | Status: DC | PRN
Start: 2022-01-28 — End: 2022-01-28
  Administered 2022-01-28: 12.5 mg via INTRAVENOUS

## 2022-01-28 MED ORDER — DEXAMETHASONE SODIUM PHOSPHATE 10 MG/ML IJ SOLN
INTRAMUSCULAR | Status: AC
  Filled 2022-01-28: qty 1

## 2022-01-28 MED ORDER — OXYCODONE HCL 5 MG PO TABS
5.0000 mg | ORAL_TABLET | ORAL | Status: DC | PRN
  Filled 2022-01-28: qty 2

## 2022-01-28 MED ORDER — CHLORHEXIDINE GLUCONATE 0.12 % MT SOLN
15.0000 mL | Freq: Once | OROMUCOSAL | Status: AC
  Administered 2022-01-28: 15 mL via OROMUCOSAL

## 2022-01-28 MED ORDER — MAGNESIUM 200 MG PO TABS
200.0000 mg | ORAL_TABLET | Freq: Two times a day (BID) | ORAL | Status: DC
  Filled 2022-01-28: qty 1

## 2022-01-28 MED ORDER — GLIMEPIRIDE 1 MG PO TABS
2.0000 mg | ORAL_TABLET | Freq: Every day | ORAL | Status: DC
  Administered 2022-01-29: 2 mg via ORAL
  Filled 2022-01-28 (×2): qty 2

## 2022-01-28 MED ORDER — METRONIDAZOLE 500 MG/100ML IV SOLN
500.0000 mg | INTRAVENOUS | Status: AC
  Administered 2022-01-28: 500 mg via INTRAVENOUS
  Filled 2022-01-28: qty 100

## 2022-01-28 MED ORDER — FENTANYL CITRATE (PF) 100 MCG/2ML IJ SOLN
INTRAMUSCULAR | Status: DC | PRN
  Administered 2022-01-28 (×3): 50 ug via INTRAVENOUS
  Administered 2022-01-28: 100 ug via INTRAVENOUS

## 2022-01-28 MED ORDER — MONTELUKAST SODIUM 10 MG PO TABS
10.0000 mg | ORAL_TABLET | Freq: Every day | ORAL | Status: DC
  Administered 2022-01-28: 10 mg via ORAL
  Filled 2022-01-28: qty 1

## 2022-01-28 MED ORDER — ORAL CARE MOUTH RINSE: 15.0000 mL | Freq: Once | OROMUCOSAL | Status: AC

## 2022-01-28 MED ORDER — ONDANSETRON HCL 4 MG PO TABS: 4.0000 mg | ORAL_TABLET | Freq: Four times a day (QID) | ORAL | Status: DC | PRN

## 2022-01-28 MED ORDER — GABAPENTIN 300 MG PO CAPS
300.0000 mg | ORAL_CAPSULE | Freq: Every day | ORAL | Status: DC
  Administered 2022-01-28: 300 mg via ORAL
  Filled 2022-01-28: qty 1

## 2022-01-28 SURGICAL SUPPLY — 59 items
ADH SKN CLS APL DERMABOND .7 (GAUZE/BANDAGES/DRESSINGS) ×1
AGENT HMST KT MTR STRL THRMB (HEMOSTASIS) ×1
APL ESCP 34 STRL LF DISP (HEMOSTASIS) ×1
APPLICATOR SURGIFLO ENDO (HEMOSTASIS) ×1 IMPLANT
BLADE CLIPPER SENSICLIP SURGIC (BLADE) ×3 IMPLANT
BLADE SURG 15 STRL LF DISP TIS (BLADE) ×2 IMPLANT
BLADE SURG 15 STRL SS (BLADE)
COVER TIP SHEARS 8 DVNC (MISCELLANEOUS) IMPLANT
COVER TIP SHEARS 8MM DA VINCI (MISCELLANEOUS) ×2
DEFOGGER SCOPE WARMER CLEARIFY (MISCELLANEOUS) ×1 IMPLANT
DERMABOND ADVANCED (GAUZE/BANDAGES/DRESSINGS) ×1
DERMABOND ADVANCED .7 DNX12 (GAUZE/BANDAGES/DRESSINGS) IMPLANT
DEVICE CAPIO SLIM SINGLE (INSTRUMENTS) IMPLANT
DRAPE ARM DVNC X/XI (DISPOSABLE) IMPLANT
DRAPE COLUMN DVNC XI (DISPOSABLE) IMPLANT
DRAPE DA VINCI XI ARM (DISPOSABLE) ×8
DRAPE DA VINCI XI COLUMN (DISPOSABLE) ×2
GAUZE 4X4 16PLY ~~LOC~~+RFID DBL (SPONGE) ×4 IMPLANT
GLOVE BIO SURGEON STRL SZ 6 (GLOVE) ×2 IMPLANT
GLOVE BIOGEL PI IND STRL 6.5 (GLOVE) ×2 IMPLANT
GLOVE BIOGEL PI IND STRL 7.0 (GLOVE) ×2 IMPLANT
GLOVE BIOGEL PI INDICATOR 6.5 (GLOVE)
GLOVE BIOGEL PI INDICATOR 7.0 (GLOVE)
GOWN STRL REUS W/ TWL LRG LVL3 (GOWN DISPOSABLE) ×2 IMPLANT
GOWN STRL REUS W/TWL LRG LVL3 (GOWN DISPOSABLE) ×2
HIBICLENS CHG 4% 4OZ BTL (MISCELLANEOUS) ×3 IMPLANT
HOLDER FOLEY CATH W/STRAP (MISCELLANEOUS) ×3 IMPLANT
KIT TURNOVER KIT A (KITS) ×3 IMPLANT
MARKER SKIN DUAL TIP RULER LAB (MISCELLANEOUS) ×1 IMPLANT
MESH VERTESSA LITE -Y 2X4X3 (Mesh General) ×1 IMPLANT
NDL MAYO 6 CRC TAPER PT (NEEDLE) IMPLANT
NEEDLE HYPO 22GX1.5 SAFETY (NEEDLE) ×2 IMPLANT
NEEDLE MAYO 6 CRC TAPER PT (NEEDLE) IMPLANT
NS IRRIG 1000ML POUR BTL (IV SOLUTION) ×3 IMPLANT
OBTURATOR OPTICAL STANDARD 8MM (TROCAR) ×2
OBTURATOR OPTICAL STND 8 DVNC (TROCAR) ×1
OBTURATOR OPTICALSTD 8 DVNC (TROCAR) IMPLANT
PACK ROBOT GYN CUSTOM WL (TRAY / TRAY PROCEDURE) ×1 IMPLANT
PACK VAGINAL WOMENS (CUSTOM PROCEDURE TRAY) ×2 IMPLANT
RETRACTOR LONE STAR DISPOSABLE (INSTRUMENTS) ×2 IMPLANT
RETRACTOR STAY HOOK 5MM (MISCELLANEOUS) ×2 IMPLANT
SEAL CANN UNIV 5-8 DVNC XI (MISCELLANEOUS) IMPLANT
SEAL XI 5MM-8MM UNIVERSAL (MISCELLANEOUS) ×8
SET IRRIG Y TYPE TUR BLADDER L (SET/KITS/TRAYS/PACK) ×1 IMPLANT
SPIKE FLUID TRANSFER (MISCELLANEOUS) ×1 IMPLANT
SURGIFLO W/THROMBIN 8M KIT (HEMOSTASIS) ×1 IMPLANT
SUT ABS MONO DBL WITH NDL 48IN (SUTURE) IMPLANT
SUT MNCRL AB 4-0 PS2 18 (SUTURE) ×2 IMPLANT
SUT MON AB 2-0 SH 27 (SUTURE) ×1 IMPLANT
SUT VIC AB 0 CT1 27 (SUTURE)
SUT VIC AB 0 CT1 27XBRD ANTBC (SUTURE) IMPLANT
SUT VIC AB 2-0 SH 27 (SUTURE)
SUT VIC AB 2-0 SH 27XBRD (SUTURE) IMPLANT
SUT VICRYL 2-0 SH 8X27 (SUTURE) ×2 IMPLANT
SYR BULB EAR ULCER 3OZ GRN STR (SYRINGE) ×2 IMPLANT
TOWEL OR 17X26 10 PK STRL BLUE (TOWEL DISPOSABLE) ×3 IMPLANT
TRAY FOL W/BAG SLVR 16FR STRL (SET/KITS/TRAYS/PACK) IMPLANT
TRAY FOLEY MTR SLVR 14FR STAT (SET/KITS/TRAYS/PACK) ×2 IMPLANT
TRAY FOLEY W/BAG SLVR 16FR LF (SET/KITS/TRAYS/PACK) ×2

## 2022-01-28 NOTE — Discharge Instructions (Signed)
POST OPERATIVE INSTRUCTIONS ? ?General Instructions ?Recovery (not bed rest) will last approximately 6 weeks ?Walking is encouraged, but refrain from strenuous exercise/ housework/ heavy lifting. ?No lifting >10lbs  ?Nothing in the vagina- NO intercourse, tampons or douching ?Bathing:  Do not submerge in water (NO swimming, bath, hot tub, etc) until after your postop visit. You can shower starting the day after surgery.  ?No driving until you are not taking narcotic pain medicine and until your pain is well enough controlled that you can slam on the breaks or make sudden movements if needed.  ? ?Taking your medications ?Please take your acetaminophen and ibuprofen on a schedule for the first 48 hours. Take 660m ibuprofen, then take 5058macetaminophen 3 hours later, then continue to alternate ibuprofen and acetaminophen. That way you are taking each type of medication every 6 hours. ?Take the prescribed narcotic (oxycodone, tramadol, etc) as needed, with a maximum being every 4 hours.  ?Take a stool softener daily to keep your stools soft and preventing you from straining. If you have diarrhea, you decrease your stool softener. This is explained more below. We have prescribed you Miralax. ? ?Reasons to Call the Nurse (see last page for phone numbers) ?Heavy Bleeding (changing your pad every 1-2 hours) ?Persistent nausea/vomiting ?Fever (100.4 degrees or more) ?Incision problems (pus or other fluid coming out, redness, warmth, increased pain) ? ?Things to Expect After Surgery ?Mild to Moderate pain is normal during the first day or two after surgery. If prescribed, take Ibuprofen or Tylenol first and use the stronger medicine for ?break-through? pain. You can overlap these medicines because they work differently.  ? ?Constipation  ? ?To Prevent Constipation:  Eat a well-balanced diet including protein, grains, fresh fruit and vegetables.  Drink plenty of fluids. Walk regularly.  Depending on specific instructions  from your physician: take Miralax daily and additionally you can add a stool softener (colace/ docusate) and fiber supplement. Continue as long as you're on pain medications.  ? ?To Treat Constipation:  If you do not have a bowel movement in 2 days after surgery, you can take 2 Tbs of Milk of Magnesia 1-2 times a day until you have a bowel movement. If diarrhea occurs, decrease the amount or stop the laxative. If no results with Milk of Magnesia, you can drink a bottle of magnesium citrate which you can purchase over the counter. ? ?Fatigue:  This is a normal response to surgery and will improve with time.  Plan frequent rest periods throughout the day. ? ?Gas Pain:  This is very common but can also be very painful! Drink warm liquids such as herbal teas, bouillon or soup. Walking will help you pass more gas.  Mylicon or Gas-X can be taken over the counter. ? ?Leaking Urine:  Varying amounts of leakage may occur after surgery.  This should improve with time. Your bladder needs at least 3 months to recover from surgery. If you leak after surgery, be sure to mention this to your doctor at your post-op visit. If you were taking medications for overactive bladder prior to surgery, be sure to restart the medications immediately after surgery. ? ?Incisions: If you have incisions on your abdomen, the skin glue will dissolve on its own over time. It is ok to gently rinse with soap and water over these incisions but do not scrub. ? ?Catheter ?Approximately 50% of patients are unable to urinate after surgery and need to go home with a catheter. This allows your bladder to  rest so it can return to full function. If you go home with a catheter, the office will call to set up a voiding trial a few days after surgery. For most patients, by this visit, they are able to urinate on their own. Long term catheter use is rare.  ? ?Return to Work ? ?As work demands and recovery times vary widely, it is hard to predict when you will want  to return to work. If you have a desk job with no strenuous physical activity, and if you would like to return sooner than generally recommended, discuss this with your provider or call our office.  ? ?Post op concerns ? ?For non-emergent issues, please call the Urogynecology Nurse. Please leave a message and someone will contact you within one business day.  You can also send a message through [place].  ? ?AFTER HOURS (After 5:00 PM and on weekends):  For urgent matters that cannot wait until the next business day. Call our office [phone] and connect to the doctor on call.  ?Please reserve this for important issues. ? ? ?**FOR ANY TRUE EMERGENCY ISSUES CALL 911 OR GO TO THE NEAREST EMERGENCY ROOM.** Please inform our office or the doctor on call of any emergency.   ? ? ?APPOINTMENTS: Call [phone] ? ?

## 2022-01-28 NOTE — Anesthesia Preprocedure Evaluation (Addendum)
Anesthesia Evaluation  ?Patient identified by MRN, date of birth, ID band ?Patient awake ? ? ? ?Reviewed: ?Allergy & Precautions, NPO status , Patient's Chart, lab work & pertinent test results ? ?History of Anesthesia Complications ?(+) PONV, DIFFICULT AIRWAY and history of anesthetic complications ? ?Airway ?Mallampati: III ? ?TM Distance: <3 FB ?Neck ROM: Full ? ? ? Dental ?no notable dental hx. ?(+) Dental Advisory Given ?  ?Pulmonary ?neg pulmonary ROS,  ?  ?Pulmonary exam normal ? ? ? ? ? ? ? Cardiovascular ?hypertension, Normal cardiovascular exam ? ?No meds for BP  ?  ?Neuro/Psych ?negative neurological ROS ? negative psych ROS  ? GI/Hepatic ?Neg liver ROS, Crohn's dz ?  ?Endo/Other  ?diabetes, Type 2, Oral Hypoglycemic Agents ? Renal/GU ?negative Renal ROS  ? ?  ?Musculoskeletal ? ?(+) Arthritis ,  ? Abdominal ?  ?Peds ? Hematology ?  ?Anesthesia Other Findings ? ? Reproductive/Obstetrics ? ?  ? ? ? ? ? ? ? ? ? ? ? ? ? ?  ?  ? ? ? ? ? ? ?Anesthesia Physical ? ?Anesthesia Plan ? ?ASA: 2 ? ?Anesthesia Plan: General  ? ?Post-op Pain Management: Celebrex PO (pre-op)* and Tylenol PO (pre-op)*  ? ?Induction: Intravenous ? ?PONV Risk Score and Plan: 4 or greater and Ondansetron, Dexamethasone and Diphenhydramine ? ?Airway Management Planned: Oral ETT and Video Laryngoscope Planned ? ?Additional Equipment: None ? ?Intra-op Plan:  ? ?Post-operative Plan: Extubation in OR ? ?Informed Consent: I have reviewed the patients History and Physical, chart, labs and discussed the procedure including the risks, benefits and alternatives for the proposed anesthesia with the patient or authorized representative who has indicated his/her understanding and acceptance.  ? ? ? ?Dental advisory given ? ?Plan Discussed with: Anesthesiologist and CRNA ? ?Anesthesia Plan Comments: (- 2 IV's)  ? ? ? ? ?Anesthesia Quick Evaluation ? ?

## 2022-01-28 NOTE — Anesthesia Postprocedure Evaluation (Signed)
Anesthesia Post Note ? ?Patient: ANTANETTE RICHWINE ? ?Procedure(s) Performed: XI ROBOTIC ASSISTED LAPAROSCOPIC SACROCOLPOPEXY ---Revision ?CYSTOSCOPY ? ?  ? ?Patient location during evaluation: PACU ?Anesthesia Type: General ?Level of consciousness: sedated ?Pain management: pain level controlled ?Vital Signs Assessment: post-procedure vital signs reviewed and stable ?Respiratory status: spontaneous breathing and respiratory function stable ?Cardiovascular status: stable ?Postop Assessment: no apparent nausea or vomiting ?Anesthetic complications: no ? ? ?No notable events documented. ? ?Last Vitals:  ?Vitals:  ? 01/28/22 1830 01/28/22 1848  ?BP: (!) 151/77 (!) 144/78  ?Pulse: 80 65  ?Resp: 13 15  ?Temp: (!) 36.4 ?C 36.4 ?C  ?SpO2: 96% 99%  ?  ?Last Pain:  ?Vitals:  ? 01/28/22 1848  ?TempSrc: Oral  ?PainSc: 0-No pain  ? ? ?  ?  ?  ?  ?  ?  ? ?Videl Nobrega DANIEL ? ? ? ? ?

## 2022-01-28 NOTE — Op Note (Signed)
Operative Note ? 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 ?Postoperative Diagnosis: same ? ?Procedures performed:  ?Robotic assisted sacrocolpopexy (revision), cystoscopy ? ?Implants:  ?Implant Name Type Inv. Item Serial No. Manufacturer Lot No. LRB No. Used Action  ?MESH Valli Glance [id number] - [id number] Mesh General MESH [place] [id number] N/A 1 Implanted  ? ? ?Attending Surgeon: Sherlene Shams, MD ? ? ?Anesthesia: General endotracheal ? ?Findings: 1. On laparoscopy, mesh tail from prior sacrocolpopexy had retracted and was no longer attached to the sacrum. Anterior and posterior mesh leaflets remained in place.  ? 2. On cystoscopy, normal bladder and urethra without injury or lesion. Brisk bilateral ureteral efflux noted.   ?  ? ?Specimens:  ?ID Type Source Tests Collected by Time Destination  ?1 : Pelvic Nodule Tissue PATH Soft tissue SURGICAL PATHOLOGY [name], MD 01/28/2022 1458   ? ? ?Estimated blood loss: 50 mL ? ?IV fluids: 1500 mL ? ?Urine output: see flowsheet  ? ?Complications: none ? ?Procedure in Detail:  ? ?After informed consent was obtained, the patient was taken to the operating room, where general anesthesia was induced and found to be adequate. She was placed in dorsolithotomy position in yellowfin stirrups. Her hips were noted not to be hyperflexed or hyperextended. Her arms were padded with gel pads and tucked to her sides. Her hands were surrounded by foam. A padded strap was placed across her chest with foam between the pad and her skin. She was noted to be appropriately positioned with all pressure points well padded and off tension. A tilt test showed no slippage. She was prepped and draped in the usual sterile fashion.  A sterile Foley catheter was inserted.  ? ?0.25% plain Marcaine was injected at the umbilicus and an incision was made with a scalpel. A Veress  needle was inserted into the incision, CO2 insufflation was started, a low opening pressure was noted, and pneumoperitoneum was obtained. The Veress needle was removed and an 68m robotic trocar was placed with direct visualization. Entry into the peritoneal cavity was confirmed.  After determining placement for the other ports, Local anesthetic was injected at each site and two 8 mm incisions were made for robotic ports at 10 cm lateral to and at the level of the umbilical port. Two additional 8 mm incisions were made 10 cm lateral to these and 30 degrees down followed by 8 mm robotic ports - the right side for an assistant port. All trocars were placed sequentially under direct visualization of the camera. The patient was placed in Trendelenburg. The sacrum appeared to be free of any adhesive disease.The robot was docked on the patient's left side. Monopolar endoshears were placed in the right arm, a [place] bipolar grasper was placed in the 2nd arm of the patient's left side, and a Tip up grasper was placed in the 3rd arm on the patient's left side.  ?  ?The pelvis was inspected and the mesh tail was noted to have retracted toward the vagina and was no longer attached to the sacrum. The anterior and posterior vaginal leaflets of mesh were noted to still be intact. Attention was then turned to the sacral promontory. The peritoneum overlying the sacral promontory was tented up, dissected sharply with monopolar scissors and electrosurgery using layer by layer technique. The overlying areolar and adipose tissue were taken down until the anterior longitudinal sacral ligament was identified. The peritoneal incision was extended  down to the posterior cul-de-sac. This was performed with care to avoid the ureter on the right side and the sigmoid colon and its mesentary on the left side.   Small vessels were cauterized along the way to obtain excellent hemostasis.  ? ?With a lucite probe in the vagina, anterior vaginal  dissection was then performed with sharp dissection and electrosurgery to separate the vesicovaginal space approximately halfway down the vagina. A Y mesh was trimmed- the posterior leaflet was removed and the anterior leaflet was trimmed.  The  A mesh was then inserted into the abdomen. With the probe in the vagina, the anterior leaf of the Y mesh was affixed to the anterior portion of the vagina and previously placed mesh using a 2-0 v-loc suture. The tail of the new mesh was attached to the end of the old mesh with CV2 Gortex sutures in an interrupted fashion.  The end of the mesh was then brought to overlie the sacrum. The correct amount of tension was determined in order to elevate the vagina, but not put the mesh under tension. The distal end of the mesh was then affixed to the anterior longitudinal sacral ligament using two interrupted stitches of CV2 Gortex. The excess tail mesh was then cut and removed. Surgiflo was placed onto the surgical field and good hemostasis was noted. The peritoneum was noted to be torn and could not be fully reapproximated to cover the whole mesh. The mesh overlying the sacrum was retroperitonealized by reapproximating the peritoneum over the mesh using 2-0 monocryl.  All areas were noted to be hemostatic as the CO2 gas was deflated. All instruments were removed from the patient's abdomen.  ?  ?The Foley catheter was removed.  A 70-degree cystoscope was introduced, and 360-degree inspection revealed no injury, lesion or foreign body in the bladder. Brisk bilateral ureteral efflux was noted with the assistance of pyridium.  The bladder was drained and the cystoscope was removed.  The Foley catheter was replaced. ?  ?The robot was undocked. The CO2 gas was removed and the ports were removed.  The skin incisions were closed with subcutaneous stitches of 4-0 Monocryl and covered with dressings.  ?  ?The patient tolerated the procedure well. Sponge, lap, and needle counts were correct  x 2. She was awakened from anesthesia and transferred to the recovery room in stable condition.  ? ?

## 2022-01-28 NOTE — Transfer of Care (Signed)
Immediate Anesthesia Transfer of Care Note ? ?Patient: Mary Donovan ? ?Procedure(s) Performed: XI ROBOTIC ASSISTED LAPAROSCOPIC SACROCOLPOPEXY ---Revision ?CYSTOSCOPY ? ?Patient Location: PACU ? ?Anesthesia Type:General ? ?Level of Consciousness: awake, alert , oriented and patient cooperative ? ?Airway & Oxygen Therapy: Patient Spontanous Breathing and Patient connected to face mask oxygen ? ?Post-op Assessment: Report given to RN and Post -op Vital signs reviewed and stable ? ?Post vital signs: Reviewed and stable ? ?Last Vitals:  ?Vitals Value Taken Time  ?BP 154/72 01/28/22 1754  ?Temp 36 ?C 01/28/22 1754  ?Pulse 67 01/28/22 1757  ?Resp 16 01/28/22 1757  ?SpO2 99 % 01/28/22 1757  ?Vitals shown include unvalidated device data. ? ?Last Pain:  ?Vitals:  ? 01/28/22 1132  ?TempSrc:   ?PainSc: 5   ?   ? ?  ? ?Complications: No notable events documented. ?

## 2022-01-28 NOTE — Interval H&P Note (Signed)
History and Physical Interval Note: ? ?01/28/2022 ?11:28 AM ? ?Mary Donovan  has presented today for surgery, with the diagnosis of vaginal vault prolapse; anterior vaginal prolpase.  The various methods of treatment have been discussed with the patient and family. After consideration of risks, benefits and other options for treatment, the patient has consented to  Procedure(s): ?XI ROBOTIC ASSISTED LAPAROSCOPIC SACROCOLPOPEXY ---Revision (N/A) ?ANTERIOR REPAIR (CYSTOCELE)  (possible) (N/A) ?CYSTOSCOPY (N/A) as a surgical intervention.  The patient's history has been reviewed, patient examined, no change in status, stable for surgery.  I have reviewed the patient's chart and labs.  Questions were answered to the patient's satisfaction.   ? ? ?[name] ? ? ?

## 2022-01-28 NOTE — Anesthesia Procedure Notes (Addendum)
Procedure Name: Intubation ?Date/Time: 01/28/2022 2:14 PM ?Performed by: Cleda Daub, CRNA ?Pre-anesthesia Checklist: Patient identified, Emergency Drugs available, Suction available and Patient being monitored ?Patient Re-evaluated:Patient Re-evaluated prior to induction ?Oxygen Delivery Method: Circle system utilized ?Preoxygenation: Pre-oxygenation with 100% oxygen ?Induction Type: IV induction ?Ventilation: Mask ventilation without difficulty ?Laryngoscope Size: Glidescope and 3 ?Grade View: Grade I ?Tube type: Oral ?Number of attempts: 3 (grade 3 view with Sabra Heck 2 by CRNA and MDA, converted to glidescope.) ?Airway Equipment and Method: Stylet and Oral airway ?Placement Confirmation: ETT inserted through vocal cords under direct vision, positive ETCO2 and breath sounds checked- equal and bilateral ?Secured at: 20 cm ?Tube secured with: Tape ?Dental Injury: Teeth and Oropharynx as per pre-operative assessment  ?Difficulty Due To: Difficulty was anticipated, Difficult Airway- due to anterior larynx and Difficult Airway- due to limited oral opening ?Comments: Anterior airway anatomy; easy mask ventilation after rocuronium; recommend-glidescope intubation.  ? ? ? ? ?

## 2022-01-29 DIAGNOSIS — E119 Type 2 diabetes mellitus without complications: Secondary | ICD-10-CM | POA: Diagnosis not present

## 2022-01-29 DIAGNOSIS — N993 Prolapse of vaginal vault after hysterectomy: Secondary | ICD-10-CM | POA: Diagnosis not present

## 2022-01-29 DIAGNOSIS — Z7984 Long term (current) use of oral hypoglycemic drugs: Secondary | ICD-10-CM | POA: Diagnosis not present

## 2022-01-29 LAB — CBC
HCT: 36.6 % (ref 36.0–46.0)
Hemoglobin: 11.9 g/dL — ABNORMAL LOW (ref 12.0–15.0)
MCH: 28.3 pg (ref 26.0–34.0)
MCHC: 32.5 g/dL (ref 30.0–36.0)
MCV: 86.9 fL (ref 80.0–100.0)
Platelets: 281 10*3/uL (ref 150–400)
RBC: 4.21 MIL/uL (ref 3.87–5.11)
RDW: 14.5 % (ref 11.5–15.5)
WBC: 9.1 10*3/uL (ref 4.0–10.5)
nRBC: 0 % (ref 0.0–0.2)

## 2022-01-29 LAB — GLUCOSE, CAPILLARY
Glucose-Capillary: 143 mg/dL — ABNORMAL HIGH (ref 70–99)
Glucose-Capillary: 119 mg/dL — ABNORMAL HIGH (ref 70–99)

## 2022-01-29 MED ORDER — LOPERAMIDE HCL 2 MG PO CAPS: 2.0000 mg | ORAL_CAPSULE | ORAL | Status: DC | PRN

## 2022-01-29 NOTE — Progress Notes (Signed)
280 mL of sterile water inserted through foley and then foley was removed. Pt then urinated 100 mL of urine. Bladder scan showed 35 mL. Pt urinated 150 mL more after scan. Pt tolerated well. ?

## 2022-01-29 NOTE — Plan of Care (Signed)
?  Problem: Health Behavior/Discharge Planning: ?Goal: Ability to manage health-related needs will improve ?Outcome: Adequate for Discharge ?  ?Problem: Clinical Measurements: ?Goal: Will remain free from infection ?Outcome: Adequate for Discharge ?  ?Problem: Activity: ?Goal: Risk for activity intolerance will decrease ?Outcome: Adequate for Discharge ?  ?Problem: Nutrition: ?Goal: Adequate nutrition will be maintained ?Outcome: Adequate for Discharge ?  ?Problem: Elimination: ?Goal: Will not experience complications related to bowel motility ?Outcome: Adequate for Discharge ?  ?Problem: Pain Managment: ?Goal: General experience of comfort will improve ?Outcome: Adequate for Discharge ?  ?

## 2022-01-29 NOTE — Discharge Summary (Signed)
Physician Discharge Summary  ?Patient ID: ?Mary Donovan ?MRN: [id number] ?DOB/AGE: Apr 02, 1946 76 y.o. ? ?Admit date: 01/28/2022 ?Discharge date: 01/29/2022 ? ?Admission Diagnoses: ? ?Discharge Diagnoses:  ?Principal Problem: ?  Prolapse of anterior vaginal wall ?Active Problems: ?  Vaginal vault prolapse after hysterectomy ? ? ?Discharged Condition: good ? ?Hospital Course: 76yo F presented for revision robotic sacrocolpopexy and cystoscopy on 01/28/22. Post operatively she was able pass her voiding trial (PVR 35) and catheter was removed. She was tolerating regular diet, ambulating and pain was well controlled. She met discharge criteria.  ? ?Consults: None ? ?Significant Diagnostic Studies: labs:  ?Lab Results  ?Component Value Date  ? WBC 9.1 01/29/2022  ? HGB 11.9 (L) 01/29/2022  ? HCT 36.6 01/29/2022  ? MCV 86.9 01/29/2022  ? PLT 281 01/29/2022  ? ?Treatments: surgery: see above ? ?Discharge Exam: ?Blood pressure 109/64, pulse 64, temperature 97.9 ?F (36.6 ?C), temperature source Oral, resp. rate 20, height _0  (1.6 m), weight 76.5 kg, SpO2 94 %. ?General appearance: alert and cooperative ?Resp: clear to auscultation bilaterally ?Cardio: regular rate and rhythm, S1, S2 normal, no murmur, click, rub or gallop ?GI: soft, non-tender; bowel sounds normal; no masses,  no organomegaly. 5 laparoscopic incisions with clean and dry dressings ?Pelvic: no vaginal bleeding ?Extremities: extremities normal, atraumatic, no cyanosis or edema ? ?Disposition: Discharge disposition: 01-Home or Self Care ? ? ? ? ? ? ?Discharge Instructions   ? ? Call MD for:  persistant nausea and vomiting   Complete by: As directed ?  ? Call MD for:  redness, tenderness, or signs of infection (pain, swelling, redness, odor or green/yellow discharge around incision site)   Complete by: As directed ?  ? Call MD for:  severe uncontrolled pain   Complete by: As directed ?  ? Call MD for:  temperature >100.4   Complete by: As directed ?  ? Diet Carb  Modified   Complete by: As directed ?  ? Increase activity slowly   Complete by: As directed ?  ? May walk up steps   Complete by: As directed ?  ? Remove dressing in 48 hours   Complete by: As directed ?  ? ?  ? ?Allergies as of 01/29/2022   ? ?   Reactions  ? Clindamycin Swelling, Rash  ? Facial swelling  ? Latex Rash  ? Neosporin Original [bacitracin-neomycin-polymyxin] Rash  ? Penicillin G Swelling, Rash  ? Facial swelling  ? Tape Rash  ? paper  ? ?  ? ?  ?Medication List  ?  ? ?TAKE these medications   ? ?acetaminophen 325 MG tablet ?Commonly known as: TYLENOL ?Take 650 mg by mouth at bedtime. ?  ?CALCIUM 1200+D3 PO ?Take 1 tablet by mouth daily. ?  ?celecoxib 200 MG capsule ?Commonly known as: CELEBREX ?Take 200 mg by mouth at bedtime. ?  ?Contour Next Test test strip ?Generic drug: glucose blood ?daily. ?  ?cyanocobalamin 1000 MCG/ML injection ?Commonly known as: (VITAMIN B-12) ?Inject 1,000 mcg into the muscle every 30 (thirty) days. ?  ?ferrous sulfate 325 (65 FE) MG tablet ?Take 1 tablet (325 mg total) by mouth daily. ?  ?furosemide 20 MG tablet ?Commonly known as: LASIX ?Take 20 mg by mouth in the morning. ?  ?gabapentin 300 MG capsule ?Commonly known as: NEURONTIN ?Take 300 mg by mouth at bedtime. ?  ?glimepiride 1 MG tablet ?Commonly known as: AMARYL ?Take 2 mg by mouth daily. ?  ?Magnesium 250 MG Tabs ?Take 250  mg by mouth in the morning and at bedtime. ?  ?metFORMIN 500 MG tablet ?Commonly known as: GLUCOPHAGE ?Take 1,000 mg by mouth 2 (two) times daily. ?  ?montelukast 10 MG tablet ?Commonly known as: SINGULAIR ?Take 10 mg by mouth at bedtime. ?  ?oxymetazoline 0.05 % nasal spray ?Commonly known as: AFRIN ?Place 1 spray into both nostrils 2 (two) times daily. ?  ?polyethylene glycol 17 g packet ?Commonly known as: MIRALAX / GLYCOLAX ?Take 17 g by mouth daily. ?  ?simvastatin 20 MG tablet ?Commonly known as: ZOCOR ?Take 20 mg by mouth in the morning. ?  ?Vitamin D3 20 MCG (800 UNIT) Tabs ?Take 800  Units by mouth daily. ?  ? ?  ? ? ? ?Signed: ?[name] ?01/29/2022, 9:27 AM ? ? ?

## 2022-01-31 ENCOUNTER — Encounter (HOSPITAL_COMMUNITY): Payer: Self-pay | Admitting: Obstetrics and Gynecology

## 2022-02-01 LAB — SURGICAL PATHOLOGY

## 2022-02-02 ENCOUNTER — Encounter: Payer: Self-pay | Admitting: *Deleted

## 2022-02-02 DIAGNOSIS — E538 Deficiency of other specified B group vitamins: Secondary | ICD-10-CM | POA: Diagnosis not present

## 2022-02-02 DIAGNOSIS — Z1211 Encounter for screening for malignant neoplasm of colon: Secondary | ICD-10-CM | POA: Diagnosis not present

## 2022-02-02 DIAGNOSIS — Z0000 Encounter for general adult medical examination without abnormal findings: Secondary | ICD-10-CM | POA: Diagnosis not present

## 2022-02-02 DIAGNOSIS — E1169 Type 2 diabetes mellitus with other specified complication: Secondary | ICD-10-CM | POA: Diagnosis not present

## 2022-02-02 DIAGNOSIS — M1990 Unspecified osteoarthritis, unspecified site: Secondary | ICD-10-CM | POA: Diagnosis not present

## 2022-02-02 DIAGNOSIS — G4700 Insomnia, unspecified: Secondary | ICD-10-CM | POA: Diagnosis not present

## 2022-02-02 DIAGNOSIS — E7800 Pure hypercholesterolemia, unspecified: Secondary | ICD-10-CM | POA: Diagnosis not present

## 2022-02-02 DIAGNOSIS — I10 Essential (primary) hypertension: Secondary | ICD-10-CM | POA: Diagnosis not present

## 2022-02-02 DIAGNOSIS — Z79899 Other long term (current) drug therapy: Secondary | ICD-10-CM | POA: Diagnosis not present

## 2022-02-16 NOTE — Telephone Encounter (Signed)
Completed.

## 2022-03-01 DIAGNOSIS — E538 Deficiency of other specified B group vitamins: Secondary | ICD-10-CM | POA: Diagnosis not present

## 2022-03-02 ENCOUNTER — Encounter: Payer: Medicare Other | Admitting: Obstetrics and Gynecology

## 2022-03-02 DIAGNOSIS — Z1211 Encounter for screening for malignant neoplasm of colon: Secondary | ICD-10-CM | POA: Diagnosis not present

## 2022-03-03 ENCOUNTER — Ambulatory Visit (INDEPENDENT_AMBULATORY_CARE_PROVIDER_SITE_OTHER): Payer: Medicare Other | Admitting: Obstetrics and Gynecology

## 2022-03-03 ENCOUNTER — Encounter: Payer: Self-pay | Admitting: Obstetrics and Gynecology

## 2022-03-03 VITALS — BP 165/76 | HR 85

## 2022-03-03 DIAGNOSIS — Z9889 Other specified postprocedural states: Secondary | ICD-10-CM

## 2022-03-03 DIAGNOSIS — N8110 Cystocele, unspecified: Secondary | ICD-10-CM

## 2022-03-03 NOTE — Progress Notes (Signed)
[facility]  Date of Visit: 03/03/2022  History of Present Illness: Ms. Keeling is a 76 y.o. female scheduled today for a post-operative visit.   Surgery: s/p Robotic assisted sacrocolpopexy (revision), cystoscopy on 01/28/22  She passed her postoperative void trial.   Postoperative course has been uncomplicated.   Today she reports she is doing well. Has some occasional lower abdominal pin.   UTI in the last 6 weeks? No  Pain? No  She has returned to her normal activity (except for postop restrictions) Vaginal bulge? No  Stress incontinence: Yes - with coughing, movement Urgency/frequency: No  Urge incontinence: No  Voiding dysfunction: No  Bowel issues: No   Subjective Success: Do you usually have a bulge or something falling out that you can see or feel in the vaginal area? No  Retreatment Success: Any retreatment with surgery or pessary for any compartment? No   Pathology results: PELVIC NODULE, BIOPSY:  Fibrotic hyalinized nodule with central necrosis and hemosiderin with  associated foreign body type giant cell reaction with suture material  Negative for malignancy   Medications: She has a current medication list which includes the following prescription(s): acetaminophen, calcium-magnesium-vitamin d, celecoxib, vitamin d3, contour next test, cyanocobalamin, ferrous sulfate, furosemide, gabapentin, glimepiride, magnesium, metformin, montelukast, oxymetazoline, polyethylene glycol, and simvastatin.   Allergies: Patient is allergic to clindamycin, latex, neosporin original [bacitracin-neomycin-polymyxin], penicillin g, and tape.   Physical Exam: BP (!) 165/76   Pulse 85   Abdomen: soft, non-tender, without masses or organomegaly Laparoscopic Incisions: healing well. Suture present on midline and right mid incision- removed with scissors Pelvic Examination: Vagina: No tenderness along the anterior or posterior vagina. No apical tenderness. No pelvic masses.  No  visible or palpable mesh.  POP-Q: POP-Q  -0.5                                            Aa   -0.5                                           Ba  -8                                              C   3.5                                            Gh  4                                            Pb  8                                            tvl   -3  Ap  -3                                            Bp                                                 D     ---------------------------------------------------------  Assessment and Plan:  1. Post-operative state   2. Prolapse of anterior vaginal wall     - Small recurrence of anterior vaginal wall but not bothersome at this time.  - Can resume regular activity. - Discussed avoidance of heavy lifting ( > 20lbs) and straining long term to reduce the risk of recurrence.  - Follow up 6 months  [name], MD

## 2022-03-07 DIAGNOSIS — S90122A Contusion of left lesser toe(s) without damage to nail, initial encounter: Secondary | ICD-10-CM | POA: Diagnosis not present

## 2022-03-07 DIAGNOSIS — E1142 Type 2 diabetes mellitus with diabetic polyneuropathy: Secondary | ICD-10-CM | POA: Diagnosis not present

## 2022-03-07 DIAGNOSIS — E119 Type 2 diabetes mellitus without complications: Secondary | ICD-10-CM | POA: Diagnosis not present

## 2022-03-07 DIAGNOSIS — Z8631 Personal history of diabetic foot ulcer: Secondary | ICD-10-CM | POA: Diagnosis not present

## 2022-03-31 DIAGNOSIS — E538 Deficiency of other specified B group vitamins: Secondary | ICD-10-CM | POA: Diagnosis not present

## 2022-05-02 DIAGNOSIS — E538 Deficiency of other specified B group vitamins: Secondary | ICD-10-CM | POA: Diagnosis not present

## 2022-05-24 DIAGNOSIS — N6489 Other specified disorders of breast: Secondary | ICD-10-CM | POA: Diagnosis not present

## 2022-06-01 DIAGNOSIS — E538 Deficiency of other specified B group vitamins: Secondary | ICD-10-CM | POA: Diagnosis not present

## 2022-06-08 DIAGNOSIS — Z8631 Personal history of diabetic foot ulcer: Secondary | ICD-10-CM | POA: Diagnosis not present

## 2022-06-08 DIAGNOSIS — E1142 Type 2 diabetes mellitus with diabetic polyneuropathy: Secondary | ICD-10-CM | POA: Diagnosis not present

## 2022-06-08 DIAGNOSIS — E119 Type 2 diabetes mellitus without complications: Secondary | ICD-10-CM | POA: Diagnosis not present

## 2022-07-04 DIAGNOSIS — E538 Deficiency of other specified B group vitamins: Secondary | ICD-10-CM | POA: Diagnosis not present

## 2022-08-02 DIAGNOSIS — E7800 Pure hypercholesterolemia, unspecified: Secondary | ICD-10-CM | POA: Diagnosis not present

## 2022-08-02 DIAGNOSIS — Z79899 Other long term (current) drug therapy: Secondary | ICD-10-CM | POA: Diagnosis not present

## 2022-08-02 DIAGNOSIS — E1169 Type 2 diabetes mellitus with other specified complication: Secondary | ICD-10-CM | POA: Diagnosis not present

## 2022-08-02 DIAGNOSIS — R197 Diarrhea, unspecified: Secondary | ICD-10-CM | POA: Diagnosis not present

## 2022-08-02 DIAGNOSIS — E538 Deficiency of other specified B group vitamins: Secondary | ICD-10-CM | POA: Diagnosis not present

## 2022-08-02 DIAGNOSIS — I10 Essential (primary) hypertension: Secondary | ICD-10-CM | POA: Diagnosis not present

## 2022-08-18 IMAGING — CT CT ABD-PELV W/ CM
2 of 5 series · 14 of 46 positions shown, 16 images · IV contrast (omnipaque)
Comparison: None.

CLINICAL DATA: 75-year-old female with acute abdominal pain
postoperative day 7 status post surgery for vaginal prolapse after
hysterectomy. Operative lysis of omental adhesions to the anterior
midline abdominal wall. Sacrocolpopexy and cystoscopy.

EXAM:
CT ABDOMEN AND PELVIS WITH CONTRAST
TECHNIQUE: Multidetector CT imaging of the abdomen and pelvis was performed
using the standard protocol following bolus administration of
intravenous contrast.
CONTRAST:  80mL OMNIPAQUE IOHEXOL 350 MG/ML SOLN

[Series 2: axial st · axial · 0.81mm/px · z∈[+1170,+1580]mm · 11 of 98 slices shown, 13 images]
[im 8/98  soft-tissue]
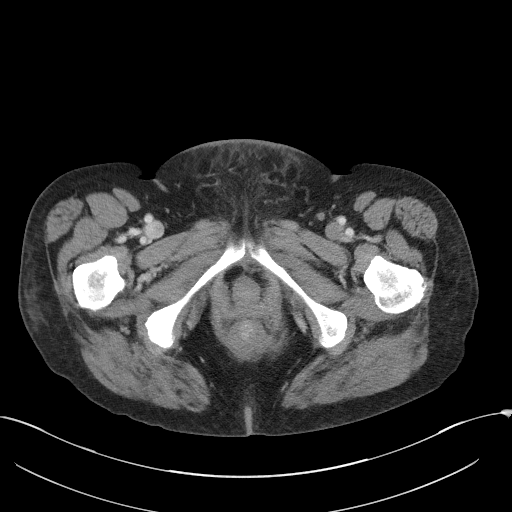
[im 8/98  bone]
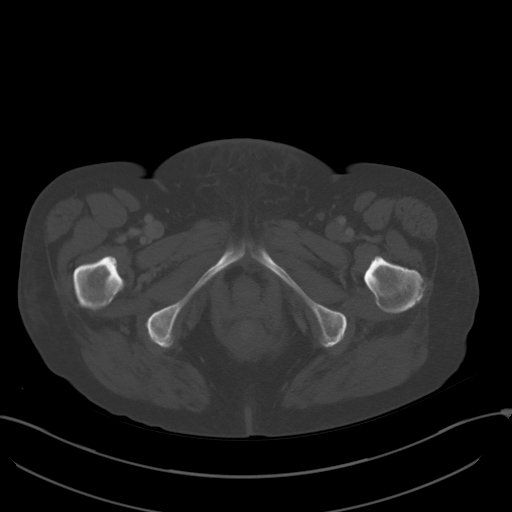
[im 15/98  soft-tissue]
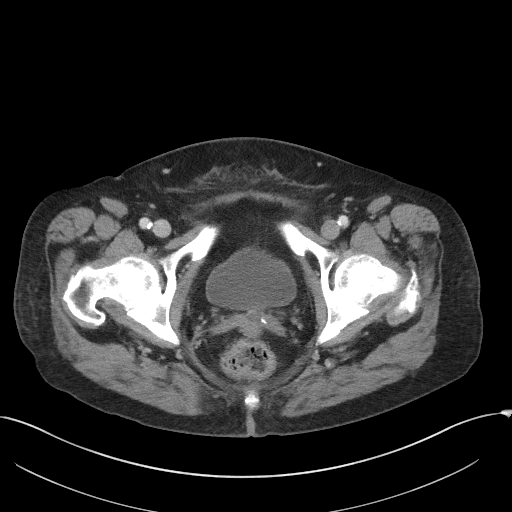
[im 23/98  soft-tissue]
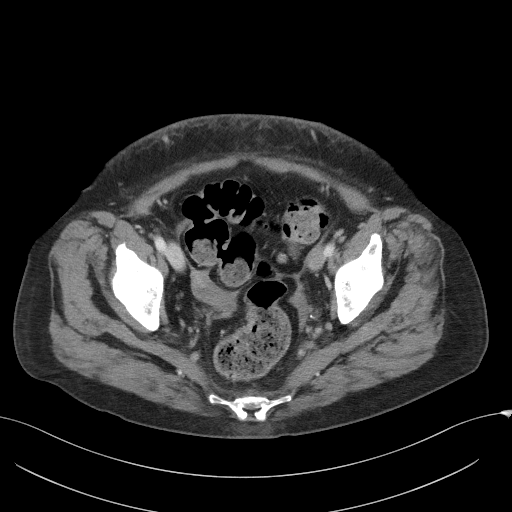
[im 30/98  soft-tissue]
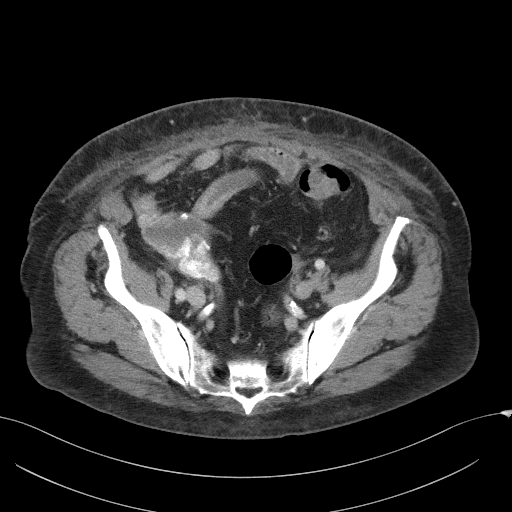
[im 38/98  soft-tissue]
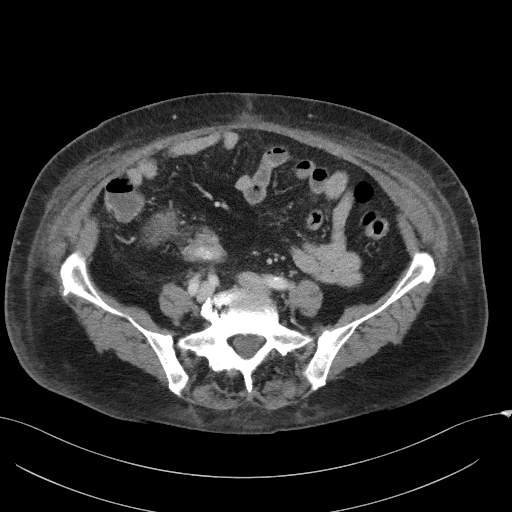
[im 53/98  soft-tissue]
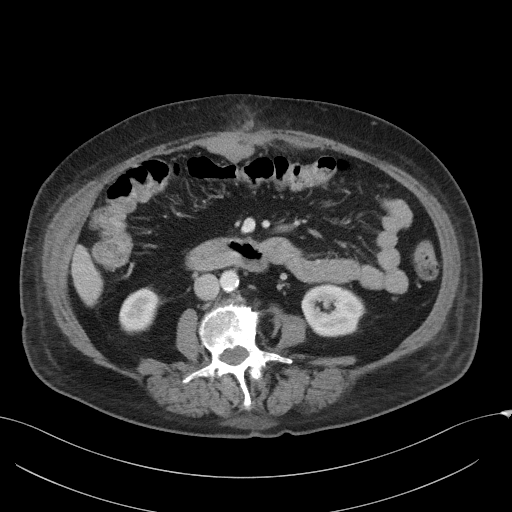
[im 60/98  soft-tissue]
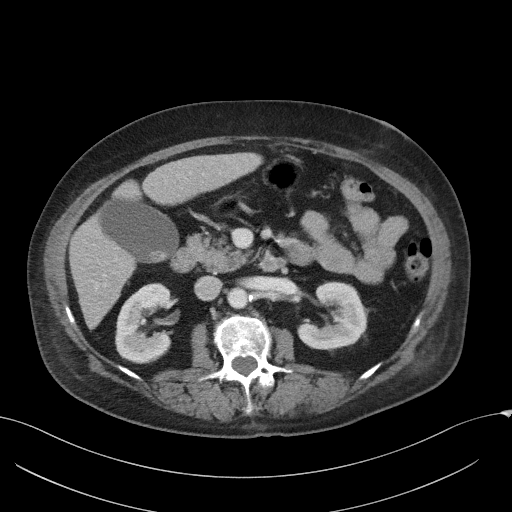
[im 68/98  soft-tissue]
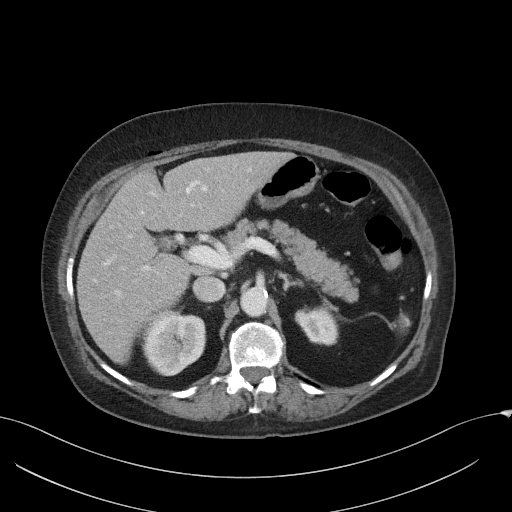
[im 75/98  soft-tissue]
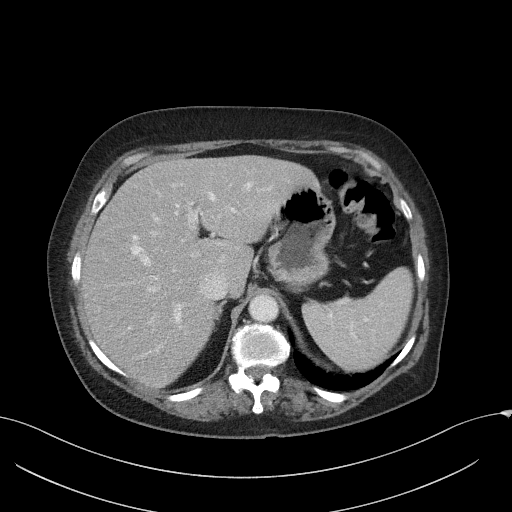
[im 75/98  bone]
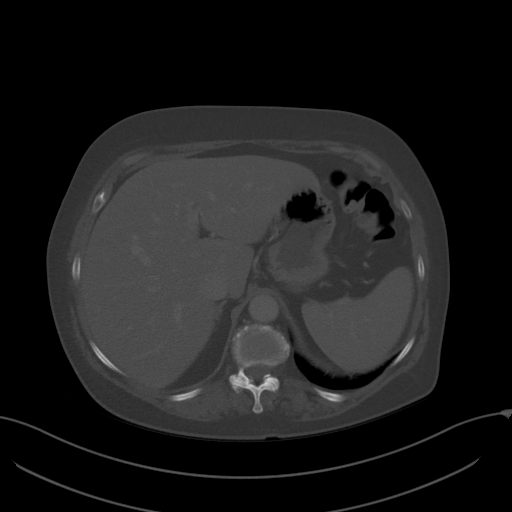
[im 83/98  soft-tissue]
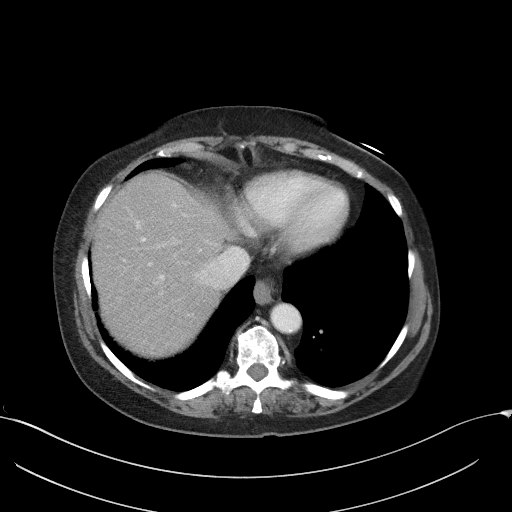
[im 90/98  soft-tissue]
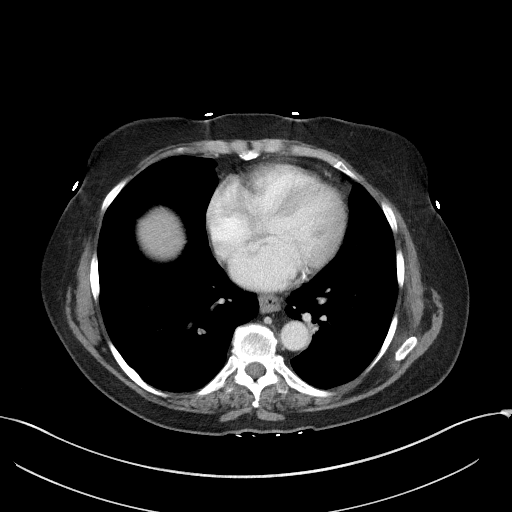

[Series 5: coronal st · coronal · 0.79mm/px · 3 of 101 slices shown]
[im 34/101  soft-tissue]
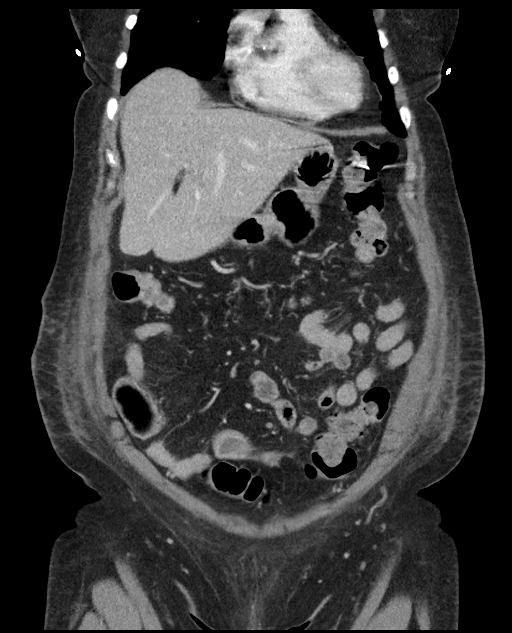
[im 45/101  soft-tissue]
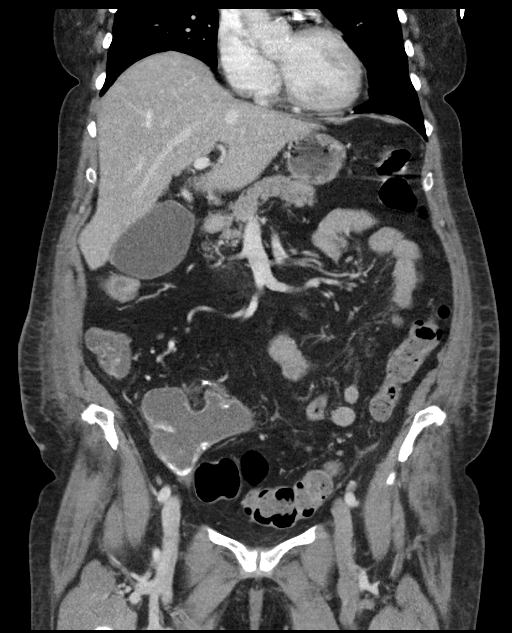
[im 56/101  soft-tissue]
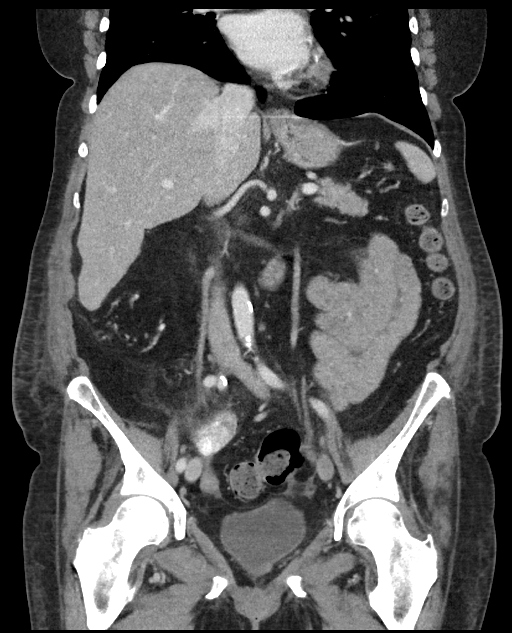

[14 of 46 positions shown; findings below may reference images not displayed]

FINDINGS: Lower chest: Calcified coronary artery atherosclerosis. Cardiac size
at the upper limits of normal. No pericardial or pleural effusion.
Minimal lung base atelectasis or scarring.

Hepatobiliary: Cholelithiasis. Mixed density 22 mm gallstone. But no
pericholecystic inflammation. Liver enhancement within normal
limits. No bile duct enlargement.

Pancreas: Negative.

Spleen: Negative.  Incidental small splenule, normal variant.

Adrenals/Urinary Tract: Normal adrenal glands. Symmetric renal
enhancement with nonobstructed kidneys. Symmetric delayed renal
contrast excretion. No hydroureter. No nephrolithiasis. Urinary
bladder is diminutive with trace non dependent gas (series 2, image
86), but otherwise within normal limits.

Stomach/Bowel: Retained stool in the distal large bowel. Sigmoid
redundancy with occasional diverticula.

Nondilated transverse colon, but nodularity in the transverse colon
omentum with intermediate density more resembles omental caking than
fluid but might be hematoma in the omentum (series 2, image 49). No
free air identified. No free fluid in the abdomen. This finding is
in proximity to a small right paraumbilical fat herniation measuring
24 mm diameter. And there is a superimposed midline overlying
ventral abdominal incision.

Evidence of postoperative changes to the cecum with suspected neo
terminal ileum. No convincing large bowel inflammation. No dilated
small bowel loops. Small gastric hiatal hernia. Otherwise negative
stomach and duodenum.

Vascular/Lymphatic: Calcified aortic atherosclerosis. Major arterial
structures in the abdomen and pelvis are patent. No lymphadenopathy.
Portal venous system is patent.

Reproductive: Small volume of gas within the vagina. Surgically
absent uterus. Ovaries are within normal limits.

Other: Very small volume of free fluid in the pelvis, some with
mildly complex fluid density. Trace presacral stranding

Musculoskeletal: Advanced lumbar spine degeneration with mild
scoliosis. No acute osseous abnormality identified.

Patchy widespread ventral lower abdominal wall subcutaneous
stranding. No abdominal wall fluid collection or gas. Small fat
containing right paraumbilical hernia does not appear inflamed on
series 2, image 48 and sagittal image 62.
IMPRESSION: 1. Superficial abdominal wall inflammation compatible with recent
surgery.
Underlying a small fat containing paraumbilical hernia (2.4 cm)
there is confluent mixed density within the greater omentum. This is
most compatible with omental Hematoma following reported lysis of
adhesions.
There is no other hemoperitoneum, and no free air or free fluid in
the abdomen.
There is trace hemoperitoneum suspected in the pelvis.

2. Trace gas within the vagina. Vaginal cuff and ovaries are normal
limits.

3. Prior postoperative changes also to the cecum with suspected neo
terminal ileum. No bowel obstruction or convincing bowel
inflammation.

4. Trace gas within the urinary bladder, might be related to recent
catheterization but UTI not excluded.

5. Cholelithiasis without CT evidence of acute cholecystitis.

6. Calcified coronary artery atherosclerosis. Aortic Atherosclerosis
(PSHEK-A60.0).

## 2022-08-31 DIAGNOSIS — E538 Deficiency of other specified B group vitamins: Secondary | ICD-10-CM | POA: Diagnosis not present

## 2022-09-02 ENCOUNTER — Ambulatory Visit (INDEPENDENT_AMBULATORY_CARE_PROVIDER_SITE_OTHER): Payer: Medicare Other | Admitting: Obstetrics and Gynecology

## 2022-09-02 ENCOUNTER — Encounter: Payer: Self-pay | Admitting: Obstetrics and Gynecology

## 2022-09-02 VITALS — BP 165/90 | HR 91

## 2022-09-02 DIAGNOSIS — N8110 Cystocele, unspecified: Secondary | ICD-10-CM | POA: Diagnosis not present

## 2022-09-02 DIAGNOSIS — N393 Stress incontinence (female) (male): Secondary | ICD-10-CM | POA: Diagnosis not present

## 2022-09-02 NOTE — Progress Notes (Signed)
[name]  SUBJECTIVE  History of Present Illness: Mary Donovan is a 76 y.o. female seen in follow-up for prolapse.   Feels the prolapse is about the same, it is present but not that bothersome overall. Has some occasional leakage with coughing. Has some urgency, but mostly due to the furosemide and does not usually leak with urgency   Past Medical History: Patient  has a past medical history of Arthritis, Complication of anesthesia, COVID (11/26/2020), Crohn disease ([place]), DM type 2 (diabetes mellitus, type 2) ([place]), Hypercholesterolemia, Hypertension, Pneumonia, PONV (postoperative nausea and vomiting), Seasonal rhinitis, Skin cancer, Uterine prolapse without vaginal wall prolapse, Vaginal Pap smear, abnormal (1978), and Vitamin B 12 deficiency.   Past Surgical History: She  has a past surgical history that includes Breast reduction surgery (1985); Illectomy (02/1998); left foot reconstruction (07/2011 left right dec 2012); Vaginal hysterectomy (1978); right big toe blister  drained (2013); right great toe wound care (2020); Appendectomy (1999); Perineoplasty (N/A, 12/21/2020); Anterior and posterior repair with sacrospinous fixation (N/A, 12/21/2020); Cystocele repair (N/A, 12/21/2020); Cystoscopy (N/A, 12/21/2020); Robotic assisted laparoscopic sacrocolpopexy (N/A, 07/27/2021); Cystoscopy (N/A, 07/27/2021); Robotic assisted laparoscopic lysis of adhesion (07/27/2021); Skin cancer excision; DG ELBOW RIGHT COMPLETE ([place] [facility]); Polypectomy; Wisdom tooth extraction; Robotic assisted laparoscopic sacrocolpopexy (N/A, 01/28/2022); and Cystoscopy (N/A, 01/28/2022).   Medications: She has a current medication list which includes the following prescription(s): acetaminophen, calcium-magnesium-vitamin d, celecoxib, vitamin d3, contour next test, cyanocobalamin, ferrous sulfate, furosemide, gabapentin, glimepiride, magnesium, metformin, montelukast, oxymetazoline, polyethylene  glycol, and simvastatin.   Allergies: Patient is allergic to clindamycin, latex, neosporin original [bacitracin-neomycin-polymyxin], penicillin g, and tape.   Social History: Patient  reports that she has never smoked. She has never used smokeless tobacco. She reports that she does not currently use alcohol. She reports that she does not use drugs.      OBJECTIVE     Physical Exam: Vitals:   09/02/22 1027  BP: (!) 165/90  Pulse: 91   Gen: No apparent distress, A&O x 3.  Detailed Urogynecologic Evaluation:  Normal external genitalia. Anterior prolapse noted to introitus with valsalva, consistent with prior exam. Speculum exam shows normal mucosa with no visible mesh.   Prior exam showed:  POP-Q: POP-Q   -0.5                                            Aa   -0.5                                           Ba   -8                                              C    3.5                                            Gh   4  Pb   8                                            tvl    -3                                            Ap   -3                                            Bp                                                  D      ASSESSMENT AND PLAN    [name] is a 76 y.o. with:  1. SUI (stress urinary incontinence, female)   2. Prolapse of anterior vaginal wall    SUI - previously had urinary retention prior to surgery. Will have her undergo urodynamic testing to assess leakage and bladder emptying - She is interested in urethral bulking, handout provided on this option.   2. POP - currently stable, will expectantly manage   Mary Folds, MD

## 2022-09-27 NOTE — Progress Notes (Addendum)
[facility] Urodynamics Procedure  Referring Physician: Lujean Amel, MD Date of Procedure: 09/28/2022  Mary Donovan is a 76 y.o. female who presents for urodynamic evaluation. Indication(s) for study: SUI  Vital Signs: There were no vitals taken for this visit.  Laboratory Results: A clean catch urine specimen revealed:  POC urine: Negative for all components    Voiding Diary: Not done   Procedure Timeout:  The correct patient was verified and the correct procedure was verified. The patient was in the correct position and safety precautions were reviewed based on at the patient's history.  Urodynamic Procedure A 20F dual lumen urodynamics catheter was placed under sterile conditions into the patient's bladder. A 20F catheter was placed into the rectum in order to measure abdominal pressure. EMG patches were placed in the appropriate position.  All connections were confirmed and calibrations/adjusted made. Saline was instilled into the bladder through the dual lumen catheters.  Cough/valsalva pressures were measured periodically during filling.  Patient was allowed to void.  The bladder was then emptied of its residual.  UROFLOW: Revealed a Qmax of 14.5 mL/sec.  She voided 71.5 mL and had a residual of 75 mL.  It was a normal pattern and represented normal habits though interpretation limited due to low voided volume.  CMG: This was performed with sterile water in the sitting position at a fill rate of 30-40 mL/min.    First sensation of fullness was 153 mLs,  First urge was 202 mLs,  Strong urge was 280 mLs and  Capacity was 493 mLs  Stress incontinence was demonstrated Highest negative CLPP was 138 cmH20 at 202 ml. Highest negative VLPP was 55 cmH20at 202 ml. Lowest positive Barrier CLPP was 80 cmH20 at 351 ml. Lowest positive Barrier VLPP was 19 cmH20 at 351 ml.  Detrusor function was normal, with no phasic contractions seen.   Compliance:  Normal . End  fill detrusor pressure was 0.8 cmH20.  Calculated compliance was 616.25 PI/[id number]  UPP: MUCP with barrier reduction was 55 cm of water.    MICTURITION STUDY: Voiding was performed with reduction using scopettes in the sitting position.  Pdet at Qmax was 84.5 cm of water.  Qmax was 3.9 mL/sec.  It was a normal pattern.  She voided 53.4 mL and had a residual of 440 mL.  It was a volitional void, sustained detrusor contraction was unclear if present as the rectal catheter was pushed out, but small detrusor contraction noted with significant straining.  Abdominal straining was present  EMG: This was performed with patches.  She had voluntary contractions, recruitment with fill was present and urethral sphincter was not relaxed with void.  The details of the procedure with the study tracings have been scanned into EPIC.   Urodynamic Impression:  1. Sensation was normal; capacity was normal 2. Stress Incontinence was demonstrated at normal pressures; 3. Detrusor Overactivity was not demonstrated 4. Emptying was dysfunctional with a elevated PVR, a sustained detrusor contraction not able to be assessed but small detrusor contraction present with significant straining, abdominal straining present, dyssynergic urethral sphincter activity on EMG.  Plan: - The patient will follow up  to discuss the findings and treatment options.

## 2022-09-28 ENCOUNTER — Encounter: Payer: Self-pay | Admitting: Obstetrics and Gynecology

## 2022-09-28 ENCOUNTER — Ambulatory Visit (INDEPENDENT_AMBULATORY_CARE_PROVIDER_SITE_OTHER): Payer: Medicare Other | Admitting: Obstetrics and Gynecology

## 2022-09-28 VITALS — BP 154/80 | HR 90

## 2022-09-28 DIAGNOSIS — N8110 Cystocele, unspecified: Secondary | ICD-10-CM | POA: Diagnosis not present

## 2022-09-28 DIAGNOSIS — N393 Stress incontinence (female) (male): Secondary | ICD-10-CM | POA: Diagnosis not present

## 2022-09-28 DIAGNOSIS — R350 Frequency of micturition: Secondary | ICD-10-CM | POA: Diagnosis not present

## 2022-09-28 DIAGNOSIS — N3941 Urge incontinence: Secondary | ICD-10-CM | POA: Diagnosis not present

## 2022-09-28 LAB — POCT URINALYSIS DIPSTICK
Bilirubin, UA: NEGATIVE
Blood, UA: NEGATIVE
Glucose, UA: NEGATIVE
Ketones, UA: NEGATIVE
Leukocytes, UA: NEGATIVE
Nitrite, UA: NEGATIVE
Protein, UA: NEGATIVE
Spec Grav, UA: >=1.03 — AB (ref 1.010–1.025)
Urobilinogen, UA: 0.2 E.U./dL
pH, UA: 6.5 (ref 5.0–8.0)

## 2022-09-28 NOTE — Patient Instructions (Signed)
Taking Care of Yourself after Urodynamics   Drink plenty of water for a day or two following your procedure. Try to have about 8 ounces (one cup) at a time, and do this 6 times or more per day unless you have fluid restrictitons AVOID irritative beverages such as coffee, tea, soda, alcoholic or citrus drinks for a day or two, as this may cause burning with urination. You may experience some discomfort or a burning sensation with urination after having this procedure. You can use over the counter Azo or pyridium to help with burning and follow the instructions on the packaging. If it does not improve within 1-2 days, or other symptoms appear (fever, chills, or difficulty urinating) call the office to speak to a nurse.  You may return to normal daily activities such as work, school, driving, exercising and housework on the day of the procedure. If your doctor gave you a prescription, take it as ordered.

## 2022-09-30 DIAGNOSIS — E538 Deficiency of other specified B group vitamins: Secondary | ICD-10-CM | POA: Diagnosis not present

## 2022-10-21 ENCOUNTER — Encounter: Payer: Self-pay | Admitting: Obstetrics and Gynecology

## 2022-10-21 ENCOUNTER — Ambulatory Visit (INDEPENDENT_AMBULATORY_CARE_PROVIDER_SITE_OTHER): Payer: Medicare Other | Admitting: Obstetrics and Gynecology

## 2022-10-21 VITALS — BP 164/89 | HR 70

## 2022-10-21 DIAGNOSIS — R339 Retention of urine, unspecified: Secondary | ICD-10-CM | POA: Diagnosis not present

## 2022-10-21 DIAGNOSIS — N393 Stress incontinence (female) (male): Secondary | ICD-10-CM

## 2022-10-21 NOTE — Progress Notes (Unsigned)
[facility] Return Visit  SUBJECTIVE  History of Present Illness: Mary Donovan is a 77 y.o. female seen in follow-up for prolapse/ incontinence  Returns today after urodynamic testing. We had previously discussed treatment for stress incontinence.   Urodynamic Impression:  1. Sensation was normal; capacity was normal 2. Stress Incontinence was demonstrated at normal pressures; 3. Detrusor Overactivity was not demonstrated 4. Emptying was dysfunctional with a elevated PVR (435m), a sustained detrusor contraction not able to be assessed but small detrusor contraction present with significant straining, abdominal straining present, dyssynergic urethral sphincter activity on EMG.  Past Medical History: Patient  has a past medical history of Arthritis, Complication of anesthesia, COVID (11/26/2020), Crohn disease ([place], DM type 2 (diabetes mellitus, type 2) ([place], Hypercholesterolemia, Hypertension, Pneumonia, PONV (postoperative nausea and vomiting), Seasonal rhinitis, Skin cancer, Uterine prolapse without vaginal wall prolapse, Vaginal Pap smear, abnormal (1978), and Vitamin B 12 deficiency.   Past Surgical History: She  has a past surgical history that includes Breast reduction surgery (1985); Illectomy (02/1998); left foot reconstruction (07/2011 left right dec 2012); Vaginal hysterectomy (1978); right big toe blister  drained (2013); right great toe wound care (2020); Appendectomy (1999); Perineoplasty (N/A, 12/21/2020); Anterior and posterior repair with sacrospinous fixation (N/A, 12/21/2020); Cystocele repair (N/A, 12/21/2020); Cystoscopy (N/A, 12/21/2020); Robotic assisted laparoscopic sacrocolpopexy (N/A, 07/27/2021); Cystoscopy (N/A, 07/27/2021); Robotic assisted laparoscopic lysis of adhesion (07/27/2021); Skin cancer excision; DG ELBOW RIGHT COMPLETE ([place]); Polypectomy; Wisdom tooth extraction; Robotic assisted laparoscopic sacrocolpopexy (N/A, 01/28/2022); and Cystoscopy  (N/A, 01/28/2022).   Medications: She has a current medication list which includes the following prescription(s): acetaminophen, calcium-magnesium-vitamin d, celecoxib, vitamin d3, contour next test, cyanocobalamin, ferrous sulfate, furosemide, gabapentin, glimepiride, magnesium, metformin, montelukast, oxymetazoline, polyethylene glycol, and simvastatin.   Allergies: Patient is allergic to clindamycin, latex, neosporin original [bacitracin-neomycin-polymyxin], penicillin g, and tape.   Social History: Patient  reports that she has never smoked. She has never used smokeless tobacco. She reports that she does not currently use alcohol. She reports that she does not use drugs.      OBJECTIVE     Physical Exam: Vitals:   10/21/22 1004  BP: (!) 164/89  Pulse: 70   Gen: No apparent distress, A&O x 3.  Detailed Urogynecologic Evaluation:  deferred   Prior exam showed:  POP-Q: POP-Q   -0.5                                            Aa   -0.5                                           Ba   -8                                              C    3.5                                            Gh   4  Pb   8                                            tvl    -3                                            Ap   -3                                            Bp                                                  D      ASSESSMENT AND PLAN    Mary Donovan is a 77 y.o. with:  1. Incomplete bladder emptying    - We discussed that although the urodynamics did show SUI, she is still having incomplete bladder emptying since her prolapse surgery which is likely contributing to incontinence.  - Although she does of some recurrence of her anterior prolapse, UDS shows that she mainly has low detrusor contraction rather than obstructive voiding.  - Discussed options for treatment including sacral neuromodulation and intermittent self-catheterization. She  would like to proceed with SNM. Will plan for PNE in office. Medtronic information provided.  - She will provide a baseline bladder diary. Will have her come for self cath teaching so she can record voided volumes and catheterized volumes for 3 days.  - will need updated A1c prior to procedure.   [name], MD

## 2022-10-25 ENCOUNTER — Other Ambulatory Visit (INDEPENDENT_AMBULATORY_CARE_PROVIDER_SITE_OTHER): Payer: Medicare Other | Admitting: Obstetrics and Gynecology

## 2022-10-25 ENCOUNTER — Ambulatory Visit (INDEPENDENT_AMBULATORY_CARE_PROVIDER_SITE_OTHER): Payer: Medicare Other

## 2022-10-25 DIAGNOSIS — R339 Retention of urine, unspecified: Secondary | ICD-10-CM

## 2022-10-25 NOTE — Patient Instructions (Signed)
Keep follow scheduled appointments

## 2022-10-25 NOTE — Progress Notes (Signed)
Patient needed assistance following self cath teaching due to her prolapse. A #3 incontinence ring with knob (Lot [id number]) was placed so patient could visualize her urethra without her prolapse interfering. She was able to insert and remove in office and has a hx of pessary management and feels she can manage this for in and out catheterization.

## 2022-10-25 NOTE — Progress Notes (Signed)
Mary Donovan came in for a cath teaching.  Pt was able to demonstrate self-cathing with a 12 fr catheter.  Pt used Betadine to clean the urethra and lubricant for cathing. Teaching material was given to the patient along with sample catheters.

## 2022-10-25 NOTE — Progress Notes (Deleted)
Patient presented today for a nurse visit for self cath teaching. She had significant prolapse that was preventing her from being able to visualize her urethra for self-cath teaching. For the purposes of her ability to self cath independently, patient was fitted with a #3 ring pessary with knob (Lot [id number]) to reduce prolapse. Patient was able to take the pessary in and replace in front of me and we discussed to use it for her self catheterization. She agrees to this plan of care and has managed a pessary in the past.

## 2022-10-26 ENCOUNTER — Encounter: Payer: Self-pay | Admitting: Obstetrics and Gynecology

## 2022-10-26 ENCOUNTER — Ambulatory Visit (INDEPENDENT_AMBULATORY_CARE_PROVIDER_SITE_OTHER): Payer: Medicare Other | Admitting: Obstetrics and Gynecology

## 2022-10-26 VITALS — BP 167/83 | HR 83

## 2022-10-26 DIAGNOSIS — E119 Type 2 diabetes mellitus without complications: Secondary | ICD-10-CM | POA: Insufficient documentation

## 2022-10-26 DIAGNOSIS — R339 Retention of urine, unspecified: Secondary | ICD-10-CM | POA: Diagnosis not present

## 2022-10-26 NOTE — Progress Notes (Signed)
[facility] Return Visit  SUBJECTIVE  History of Present Illness: Mary Donovan is a 77 y.o. female seen in follow-up for self cath teaching. Plan at last visit was use the pessary to assist in self catheterization.     Past Medical History: Patient  has a past medical history of Arthritis, Complication of anesthesia, COVID (11/26/2020), Crohn disease ([place]), DM type 2 (diabetes mellitus, type 2) ([place]), Hypercholesterolemia, Hypertension, Pneumonia, PONV (postoperative nausea and vomiting), Seasonal rhinitis, Skin cancer, Uterine prolapse without vaginal wall prolapse, Vaginal Pap smear, abnormal (1978), and Vitamin B 12 deficiency.   Past Surgical History: She  has a past surgical history that includes Breast reduction surgery (1985); Illectomy (02/1998); left foot reconstruction (07/2011 left right dec 2012); Vaginal hysterectomy (1978); right big toe blister  drained (2013); right great toe wound care (2020); Appendectomy (1999); Perineoplasty (N/A, 12/21/2020); Anterior and posterior repair with sacrospinous fixation (N/A, 12/21/2020); Cystocele repair (N/A, 12/21/2020); Cystoscopy (N/A, 12/21/2020); Robotic assisted laparoscopic sacrocolpopexy (N/A, 07/27/2021); Cystoscopy (N/A, 07/27/2021); Robotic assisted laparoscopic lysis of adhesion (07/27/2021); Skin cancer excision; DG ELBOW RIGHT COMPLETE ([place] HX); Polypectomy; Wisdom tooth extraction; Robotic assisted laparoscopic sacrocolpopexy (N/A, 01/28/2022); and Cystoscopy (N/A, 01/28/2022).   Medications: She has a current medication list which includes the following prescription(s): acetaminophen, calcium-magnesium-vitamin d, celecoxib, vitamin d3, contour next test, cyanocobalamin, ferrous sulfate, furosemide, gabapentin, glimepiride, magnesium, metformin, montelukast, oxymetazoline, polyethylene glycol, and simvastatin.   Allergies: Patient is allergic to clindamycin, latex, neosporin original [bacitracin-neomycin-polymyxin],  penicillin g, and tape.   Social History: Patient  reports that she has never smoked. She has never used smokeless tobacco. She reports that she does not currently use alcohol. She reports that she does not use drugs.      OBJECTIVE     Physical Exam: Vitals:   10/26/22 1058  BP: (!) 167/83  Pulse: 83   Gen: No apparent distress, A&O x 3.  Detailed Urogynecologic Evaluation:  Deferred.      ASSESSMENT AND PLAN    Ms. Cowman is a 77 y.o. with:  1. Incomplete bladder emptying     Spent time with patient in the bathroom and in the room discussing the position of the urethra and the best method for self catheterization. We tried multiple sizes and styles and the longer catheter seemed to work best for her. She was able to catheterize on the toilet successfully. Encouraged patient that we just need a few days of baseline data to assess her emptying. We discussed that she should just do her best and that if she really cannot cath then we will reassess at that time.

## 2022-10-26 NOTE — Telephone Encounter (Signed)
Called and spoke to patient and she reports she is having trouble self catheterizing. Will have her come in for a refresher today at 11:20

## 2022-10-28 ENCOUNTER — Telehealth: Payer: Self-pay | Admitting: Obstetrics and Gynecology

## 2022-10-28 NOTE — Telephone Encounter (Signed)
Called to check on patient and she reports she has been practicing her catheterization and has been successful a few time. She reports she has spilled some in the floor but she is hopeful that by the end of the weekend she will have some data points for [place] to go off of.

## 2022-11-01 DIAGNOSIS — E538 Deficiency of other specified B group vitamins: Secondary | ICD-10-CM | POA: Diagnosis not present

## 2022-11-02 ENCOUNTER — Telehealth: Payer: Self-pay | Admitting: Obstetrics and Gynecology

## 2022-11-02 NOTE — Telephone Encounter (Signed)
Called patient and spoke to her about her self cathing:  She reports that last Friday when she did a self cath she had a PVR of 100, but since then every time she self caths it has been less than 50 and most are immeasurable and just droplets. She reports she has been able to cath multiple times and only droplets are coming out and wanted [place] to know.   She is scheduled for an office evaluation for SNM. Will inform Dr. Wannetta Sender of the reported PVR's the patient reports she has been getting.

## 2022-11-10 ENCOUNTER — Encounter: Payer: Self-pay | Admitting: Obstetrics and Gynecology

## 2022-11-10 NOTE — Progress Notes (Signed)
Mary B. was contacted at [facility] to check CPT codes for procedure : PNE CPT code: [phone]  NO PA needed for this procedure. Call Ref #: [phone]

## 2022-11-15 ENCOUNTER — Encounter: Payer: Self-pay | Admitting: Obstetrics and Gynecology

## 2022-11-15 NOTE — Telephone Encounter (Signed)
Patient has been notified

## 2022-11-17 ENCOUNTER — Ambulatory Visit (INDEPENDENT_AMBULATORY_CARE_PROVIDER_SITE_OTHER): Payer: Medicare Other | Admitting: Obstetrics and Gynecology

## 2022-11-17 ENCOUNTER — Encounter: Payer: Self-pay | Admitting: Obstetrics and Gynecology

## 2022-11-17 VITALS — BP 147/77 | HR 87

## 2022-11-17 DIAGNOSIS — R339 Retention of urine, unspecified: Secondary | ICD-10-CM | POA: Diagnosis not present

## 2022-11-17 DIAGNOSIS — N3941 Urge incontinence: Secondary | ICD-10-CM | POA: Diagnosis not present

## 2022-11-17 DIAGNOSIS — R159 Full incontinence of feces: Secondary | ICD-10-CM | POA: Diagnosis not present

## 2022-11-17 NOTE — Progress Notes (Signed)
Interstim Basic Evaluation (PNE) Procedure   Procedure: Sacral Neuromodulator percutaneous sacral stimulation test using bony landmarks. Procedure repeated contralaterally for bilateral test lead placement.    Indication: 77 y.o. F with diagnosis of incomplete bladder emptying and urinary and fecal incontinence. Informed consent has been obtained.    Description:  Patient was properly identified and placed in the prone position. Patient was positioned to allow the toes to dangle freely. A grounding pad was placed on the bottom of the patient's foot and the cable was connected  to the grounding pad and external stimulation box. The patient was then prepped and draped in the usual sterile fashion. 9cm was measured from the tip of the coccyx superiorly in the midline, then 2cm superiorly and 2cm laterally to mark the foramen bilaterally. Local injection of 78m 1% lidocaine with epinephrine with 159mof sodium bicarbonate was injected at the planned entry site bilaterally.    The foramen needle was placed at a 60 degree angle into the right S3 foramen without difficulty. The proper S3 position was confirmed by the patient's sensation to stimulation utilizing the external test stimulator. This was repeated on the left side. The needle stylet was removed and the percutaneous lead was inserted. Using a push and pull technique, the needle was taken out over the lead. This was then repeated on the opposite side.    The patient cables were connected to the leads and grounding pads. The patient was cleaned off and the entire region was covered with tegaderms to secure the leads. The estimated blood loss was negligible. The patient tolerated the procedure well with no complications. Using the external test stimulator, the patient was programmed to the optimum sensation via the lead, and given instructions. Urinary diary will be kept until the return appointment. She was given post-procedure instructions and  precautions to call the office with any concerns.    Return 1 week for follow up and lead removal.    MiJaquita FoldsMD

## 2022-11-17 NOTE — Patient Instructions (Signed)
Do not shower, submerge in water or get your back wet until your next appointment.   Fill out your bladder diary as directed.   If your bandages become loose, notify the office so you can come in to have them reinforced.   Call with any questions or concerns

## 2022-11-22 NOTE — Progress Notes (Unsigned)
[facility] Return Visit  SUBJECTIVE  History of Present Illness: Mary Donovan is a 77 y.o. female seen in follow-up for [employer] PNE. Plan at last visit was assess how well the Sacroneuormodulation works for patient .   Reports she feels it was helpful.  She reports she had no bowel leakage during the trial period.  Reports some mild leakage with heavy lifting still.   Reports her urgency was significantly reduced even with her lasix use.   Overall reports an 80% improvement in symptoms in her opinion.     Past Medical History: Patient  has a past medical history of Arthritis, Complication of anesthesia, COVID (11/26/2020), Crohn disease ([place]), DM type 2 (diabetes mellitus, type 2) ([place]), Hypercholesterolemia, Hypertension, Pneumonia, PONV (postoperative nausea and vomiting), Seasonal rhinitis, Skin cancer, Uterine prolapse without vaginal wall prolapse, Vaginal Pap smear, abnormal (1978), and Vitamin B 12 deficiency.   Past Surgical History: She  has a past surgical history that includes Breast reduction surgery (1985); Illectomy (02/1998); left foot reconstruction (07/2011 left right dec 2012); Vaginal hysterectomy (1978); right big toe blister  drained (2013); right great toe wound care (2020); Appendectomy (1999); Perineoplasty (N/A, 12/21/2020); Anterior and posterior repair with sacrospinous fixation (N/A, 12/21/2020); Cystocele repair (N/A, 12/21/2020); Cystoscopy (N/A, 12/21/2020); Robotic assisted laparoscopic sacrocolpopexy (N/A, 07/27/2021); Cystoscopy (N/A, 07/27/2021); Robotic assisted laparoscopic lysis of adhesion (07/27/2021); Skin cancer excision; DG ELBOW RIGHT COMPLETE ([place] HX); Polypectomy; Wisdom tooth extraction; Robotic assisted laparoscopic sacrocolpopexy (N/A, 01/28/2022); and Cystoscopy (N/A, 01/28/2022).   Medications: She has a current medication list which includes the following prescription(s): acetaminophen, calcium-magnesium-vitamin d, celecoxib,  vitamin d3, contour next test, cyanocobalamin, furosemide, gabapentin, glimepiride, magnesium, metformin, montelukast, oxymetazoline, polyethylene glycol, simvastatin, and ferrous sulfate.   Allergies: Patient is allergic to clindamycin, latex, neosporin original [bacitracin-neomycin-polymyxin], penicillin g, and tape.   Social History: Patient  reports that she has never smoked. She has never used smokeless tobacco. She reports that she does not currently use alcohol. She reports that she does not use drugs.      OBJECTIVE    See data from Interstim diary scanned in chart  Physical Exam: Vitals:   11/24/22 1059  BP: 132/74  Pulse: 70   Gen: No apparent distress, A&O x 3.  Detailed Urogynecologic Evaluation:  Deferred.      ASSESSMENT AND PLAN    Mary Donovan is a 77 y.o. with:  1. Urinary frequency   2. Incontinence of feces, unspecified fecal incontinence type   3. Incomplete bladder emptying   4. Urge incontinence    Patient reports the PNE reduced her urinary urgency and frequency She had no incontinence of feces during the trial period Patient seems to be able to empty bladder well with this therapy Based on patient's Interstim diary, which will be scanned into the chart, patient had overall a 36% decrease in total voids, 97% decrease in the severity of her urgency, and 60% decrease in the average amount leaked urine.   She would like to proceed with Stage 2 implantation for her symptoms. Will message Dr. Wannetta Sender and surgery scheduler.

## 2022-11-24 ENCOUNTER — Ambulatory Visit (INDEPENDENT_AMBULATORY_CARE_PROVIDER_SITE_OTHER): Payer: Medicare Other | Admitting: Obstetrics and Gynecology

## 2022-11-24 ENCOUNTER — Encounter: Payer: Self-pay | Admitting: Obstetrics and Gynecology

## 2022-11-24 VITALS — BP 132/74 | HR 70

## 2022-11-24 DIAGNOSIS — R159 Full incontinence of feces: Secondary | ICD-10-CM

## 2022-11-24 DIAGNOSIS — R350 Frequency of micturition: Secondary | ICD-10-CM

## 2022-11-24 DIAGNOSIS — N3941 Urge incontinence: Secondary | ICD-10-CM

## 2022-11-24 DIAGNOSIS — R339 Retention of urine, unspecified: Secondary | ICD-10-CM

## 2022-11-24 NOTE — Patient Instructions (Signed)
Will send information to the surgery scheduler and get you scheduled for second procedure in the OR.   I hope peace is on the way for you.

## 2022-12-02 DIAGNOSIS — E538 Deficiency of other specified B group vitamins: Secondary | ICD-10-CM | POA: Diagnosis not present

## 2022-12-05 ENCOUNTER — Encounter: Payer: Self-pay | Admitting: *Deleted

## 2022-12-09 ENCOUNTER — Encounter (HOSPITAL_BASED_OUTPATIENT_CLINIC_OR_DEPARTMENT_OTHER): Payer: Self-pay | Admitting: *Deleted

## 2022-12-29 NOTE — Progress Notes (Signed)
Chart reviewed w/ anesthesia, Dr C. Woodrum.  Dr Lanetta Inch stated ok to proceed at [facility].

## 2023-01-02 ENCOUNTER — Encounter (HOSPITAL_BASED_OUTPATIENT_CLINIC_OR_DEPARTMENT_OTHER): Payer: Self-pay | Admitting: Obstetrics and Gynecology

## 2023-01-02 DIAGNOSIS — E538 Deficiency of other specified B group vitamins: Secondary | ICD-10-CM | POA: Diagnosis not present

## 2023-01-02 NOTE — Progress Notes (Signed)
Spoke w/ via phone for pre-op interview--- Mary Donovan needs dos---- ISTAT              Lab results------Current EKG dated 01/10/22 in [place]. COVID test -----patient states asymptomatic no test needed Arrive at -------1015 NPO after MN NO Solid Food.  Clear liquids from MN until---0915 Med rec completed Medications to take morning of surgery -----NONE Diabetic medication -----NONE AM of surgery Patient instructed no nail polish to be worn day of surgery Patient instructed to bring photo id and insurance card day of surgery Patient aware to have Driver (ride ) / caregiver  Son [name]  for 24 hours after surgery  Patient Special Instructions ----- Pre-Op special Istructions ----- Patient verbalized understanding of instructions that were given at this phone interview. Patient denies shortness of breath, chest pain, fever, cough at this phone interview.

## 2023-01-05 ENCOUNTER — Other Ambulatory Visit: Payer: Self-pay

## 2023-01-05 ENCOUNTER — Encounter (HOSPITAL_BASED_OUTPATIENT_CLINIC_OR_DEPARTMENT_OTHER): Payer: Self-pay | Admitting: Obstetrics and Gynecology

## 2023-01-05 ENCOUNTER — Encounter (HOSPITAL_BASED_OUTPATIENT_CLINIC_OR_DEPARTMENT_OTHER)
Admission: RE | Disposition: A | Discharge status: Home / Self Care | Payer: Self-pay | Source: Home / Self Care | Attending: Obstetrics and Gynecology

## 2023-01-05 ENCOUNTER — Ambulatory Visit (HOSPITAL_BASED_OUTPATIENT_CLINIC_OR_DEPARTMENT_OTHER)
Admission: RE | Admit: 2023-01-05 | Discharge: 2023-01-05 | Disposition: A | Discharge status: Home / Self Care | Payer: Medicare Other | Attending: Obstetrics and Gynecology | Admitting: Obstetrics and Gynecology

## 2023-01-05 ENCOUNTER — Ambulatory Visit (HOSPITAL_BASED_OUTPATIENT_CLINIC_OR_DEPARTMENT_OTHER): Payer: Medicare Other | Admitting: Anesthesiology

## 2023-01-05 ENCOUNTER — Ambulatory Visit (HOSPITAL_COMMUNITY): Payer: Medicare Other

## 2023-01-05 DIAGNOSIS — E119 Type 2 diabetes mellitus without complications: Secondary | ICD-10-CM | POA: Diagnosis not present

## 2023-01-05 DIAGNOSIS — Z7984 Long term (current) use of oral hypoglycemic drugs: Secondary | ICD-10-CM | POA: Diagnosis not present

## 2023-01-05 DIAGNOSIS — E7800 Pure hypercholesterolemia, unspecified: Secondary | ICD-10-CM | POA: Insufficient documentation

## 2023-01-05 DIAGNOSIS — K5090 Crohn's disease, unspecified, without complications: Secondary | ICD-10-CM | POA: Insufficient documentation

## 2023-01-05 DIAGNOSIS — Z85828 Personal history of other malignant neoplasm of skin: Secondary | ICD-10-CM | POA: Diagnosis not present

## 2023-01-05 DIAGNOSIS — M1990 Unspecified osteoarthritis, unspecified site: Secondary | ICD-10-CM | POA: Insufficient documentation

## 2023-01-05 DIAGNOSIS — N3281 Overactive bladder: Secondary | ICD-10-CM | POA: Diagnosis not present

## 2023-01-05 DIAGNOSIS — J449 Chronic obstructive pulmonary disease, unspecified: Secondary | ICD-10-CM | POA: Diagnosis not present

## 2023-01-05 DIAGNOSIS — R159 Full incontinence of feces: Secondary | ICD-10-CM

## 2023-01-05 DIAGNOSIS — E1140 Type 2 diabetes mellitus with diabetic neuropathy, unspecified: Secondary | ICD-10-CM | POA: Diagnosis not present

## 2023-01-05 DIAGNOSIS — Z79899 Other long term (current) drug therapy: Secondary | ICD-10-CM | POA: Diagnosis not present

## 2023-01-05 DIAGNOSIS — I10 Essential (primary) hypertension: Secondary | ICD-10-CM

## 2023-01-05 DIAGNOSIS — R339 Retention of urine, unspecified: Secondary | ICD-10-CM | POA: Diagnosis not present

## 2023-01-05 HISTORY — PX: INTERSTIM IMPLANT PLACEMENT: SHX5130

## 2023-01-05 LAB — POCT I-STAT, CHEM 8
BUN: 20 mg/dL (ref 8–23)
Calcium, Ion: 1.05 mmol/L — ABNORMAL LOW (ref 1.15–1.40)
Chloride: 105 mmol/L (ref 98–111)
Creatinine, Ser: 0.5 mg/dL (ref 0.44–1.00)
Glucose, Bld: 125 mg/dL — ABNORMAL HIGH (ref 70–99)
HCT: 39 % (ref 36.0–46.0)
Hemoglobin: 13.3 g/dL (ref 12.0–15.0)
Potassium: 4 mmol/L (ref 3.5–5.1)
Sodium: 139 mmol/L (ref 135–145)
TCO2: 25 mmol/L (ref 22–32)

## 2023-01-05 LAB — GLUCOSE, CAPILLARY: Glucose-Capillary: 103 mg/dL — ABNORMAL HIGH (ref 70–99)

## 2023-01-05 SURGERY — INSERTION, SACRAL NERVE STIMULATOR, INTERSTIM, STAGE 1
Anesthesia: Monitor Anesthesia Care

## 2023-01-05 MED ORDER — VANCOMYCIN HCL 500 MG IV SOLR
500.0000 mg | INTRAVENOUS | Status: AC
  Administered 2023-01-05: 500 mg via INTRAVENOUS

## 2023-01-05 MED ORDER — FENTANYL CITRATE (PF) 100 MCG/2ML IJ SOLN
INTRAMUSCULAR | Status: DC | PRN
  Administered 2023-01-05 (×2): 25 ug via INTRAVENOUS
  Administered 2023-01-05: 50 ug via INTRAVENOUS

## 2023-01-05 MED ORDER — PROPOFOL 1000 MG/100ML IV EMUL
INTRAVENOUS | Status: AC
  Filled 2023-01-05: qty 100

## 2023-01-05 MED ORDER — ONDANSETRON HCL 4 MG/2ML IJ SOLN
INTRAMUSCULAR | Status: DC | PRN
  Administered 2023-01-05: 4 mg via INTRAVENOUS

## 2023-01-05 MED ORDER — POVIDONE-IODINE 10 % EX SWAB: 2.0000 | Freq: Once | CUTANEOUS | Status: DC

## 2023-01-05 MED ORDER — LACTATED RINGERS IV SOLN: INTRAVENOUS | Status: DC

## 2023-01-05 MED ORDER — PROPOFOL 10 MG/ML IV BOLUS
INTRAVENOUS | Status: DC | PRN
  Administered 2023-01-05: 20 mg via INTRAVENOUS
  Administered 2023-01-05: 10 mg via INTRAVENOUS

## 2023-01-05 MED ORDER — VANCOMYCIN HCL 500 MG IV SOLR
INTRAVENOUS | Status: AC
  Filled 2023-01-05: qty 10

## 2023-01-05 MED ORDER — PROPOFOL 500 MG/50ML IV EMUL
INTRAVENOUS | Status: DC | PRN
  Administered 2023-01-05: 125 ug/kg/min via INTRAVENOUS

## 2023-01-05 MED ORDER — ACETAMINOPHEN 500 MG PO TABS: 500.0000 mg | ORAL_TABLET | Freq: Four times a day (QID) | ORAL | 0 refills | Status: AC | PRN

## 2023-01-05 MED ORDER — IBUPROFEN 600 MG PO TABS: 600.0000 mg | ORAL_TABLET | Freq: Four times a day (QID) | ORAL | 0 refills | Status: AC | PRN

## 2023-01-05 MED ORDER — ACETAMINOPHEN 500 MG PO TABS
ORAL_TABLET | ORAL | Status: AC
  Filled 2023-01-05: qty 2

## 2023-01-05 MED ORDER — ONDANSETRON HCL 4 MG/2ML IJ SOLN
INTRAMUSCULAR | Status: AC
  Filled 2023-01-05: qty 2

## 2023-01-05 MED ORDER — WATER FOR IRRIGATION, STERILE IR SOLN
Status: DC | PRN
  Administered 2023-01-05: 500 mL via SURGICAL_CAVITY

## 2023-01-05 MED ORDER — FENTANYL CITRATE (PF) 100 MCG/2ML IJ SOLN
INTRAMUSCULAR | Status: AC
  Filled 2023-01-05: qty 2

## 2023-01-05 MED ORDER — SODIUM CHLORIDE 0.9 % IV SOLN
INTRAVENOUS | Status: AC
  Filled 2023-01-05: qty 100

## 2023-01-05 MED ORDER — BUPIVACAINE HCL 0.25 % IJ SOLN
INTRAMUSCULAR | Status: DC | PRN
  Administered 2023-01-05: 20 mL

## 2023-01-05 MED ORDER — ACETAMINOPHEN 500 MG PO TABS
1000.0000 mg | ORAL_TABLET | Freq: Once | ORAL | Status: AC
  Administered 2023-01-05: 1000 mg via ORAL

## 2023-01-05 MED ORDER — OXYCODONE HCL 5 MG PO TABS: 5.0000 mg | ORAL_TABLET | ORAL | 0 refills | Status: AC | PRN

## 2023-01-05 SURGICAL SUPPLY — 54 items
ADH SKN CLS APL DERMABOND .7 (GAUZE/BANDAGES/DRESSINGS) ×1
APL PRP STRL LF DISP 70% ISPRP (MISCELLANEOUS) ×1
BLADE SURG 11 STRL SS (BLADE) ×2 IMPLANT
BLADE SURG 15 STRL LF DISP TIS (BLADE) ×2 IMPLANT
BLADE SURG 15 STRL SS (BLADE) ×1
CABLE EXTEN 4.32 INTERSTIM (NEUROSURGERY SUPPLIES) IMPLANT
CABLE NEUROSTIMULATOR GROUND (NEUROSURGERY SUPPLIES) IMPLANT
CHLORAPREP W/TINT 26 (MISCELLANEOUS) ×2 IMPLANT
COVER BACK TABLE 60X90IN (DRAPES) ×2 IMPLANT
COVER MAYO STAND STRL (DRAPES) ×2 IMPLANT
COVER PROBE U/S 5X48 (MISCELLANEOUS) IMPLANT
DERMABOND ADVANCED .7 DNX12 (GAUZE/BANDAGES/DRESSINGS) IMPLANT
DRAPE 3/4 80X56 (DRAPES) ×2 IMPLANT
DRAPE C-ARM 42X120 X-RAY (DRAPES) ×2 IMPLANT
DRAPE LAPAROSCOPIC ABDOMINAL (DRAPES) ×2 IMPLANT
DRAPE UTILITY XL STRL (DRAPES) ×2 IMPLANT
DRSG TEGADERM 4X4.75 (GAUZE/BANDAGES/DRESSINGS) ×2 IMPLANT
DRSG TEGADERM 8X12 (GAUZE/BANDAGES/DRESSINGS) ×2 IMPLANT
DRSG TELFA 3X8 NADH STRL (GAUZE/BANDAGES/DRESSINGS) ×2 IMPLANT
ELECT REM PT RETURN 9FT ADLT (ELECTROSURGICAL) ×1
ELECTRODE REM PT RTRN 9FT ADLT (ELECTROSURGICAL) ×2 IMPLANT
ENVELOPE ABSORB ANTIBACTERIAL (Mesh General) ×1 IMPLANT
EXTENSION NEURO PERCUTANEOUS (NEUROSURGERY SUPPLIES) IMPLANT
GAUZE 4X4 16PLY ~~LOC~~+RFID DBL (SPONGE) ×2 IMPLANT
GLOVE BIOGEL PI IND STRL 6.5 (GLOVE) ×2 IMPLANT
GLOVE ECLIPSE 6.0 STRL STRAW (GLOVE) ×2 IMPLANT
GOWN STRL REUS W/TWL LRG LVL3 (GOWN DISPOSABLE) ×2 IMPLANT
KIT HANDSET INTERSTIM COMM (NEUROSURGERY SUPPLIES) IMPLANT
KIT LEAD TINED W/2 NDL GUIDE (NEUROSURGERY SUPPLIES) IMPLANT
KIT TURNOVER CYSTO (KITS) ×2 IMPLANT
LEAD INTERSTIM 4.32 28 L (Lead) IMPLANT
NDL FORAMN 5 20GA (NEUROSURGERY SUPPLIES) IMPLANT
NDL HYPO 22X1.5 SAFETY MO (MISCELLANEOUS) ×2 IMPLANT
NEEDLE FORAMN 5 20GA (NEUROSURGERY SUPPLIES) IMPLANT
NEEDLE HYPO 22X1.5 SAFETY MO (MISCELLANEOUS) ×1 IMPLANT
NEUROSTIMULATOR 1.7X2X.06 (UROLOGICAL SUPPLIES) IMPLANT
PACK BASIN DAY SURGERY FS (CUSTOM PROCEDURE TRAY) ×2 IMPLANT
PENCIL SMOKE EVACUATOR (MISCELLANEOUS) ×2 IMPLANT
POUCH TYRX ANTIBAC NEURO MED (Mesh General) IMPLANT
REMOTE CONTROL NEURO PATIENT (NEUROSURGERY SUPPLIES) IMPLANT
SLEEVE SCD COMPRESS KNEE MED (STOCKING) ×2 IMPLANT
STIMULATOR EXTERNAL TRIAL (NEUROSURGERY SUPPLIES) IMPLANT
STIMULATOR INTERSTIM 2X1.7X.3 (Miscellaneous) IMPLANT
SUT MNCRL AB 4-0 PS2 18 (SUTURE) IMPLANT
SUT SILK 2 0 SH (SUTURE) ×2 IMPLANT
SUT VIC AB 2-0 SH 27 (SUTURE) ×1
SUT VIC AB 2-0 SH 27XBRD (SUTURE) ×2 IMPLANT
SYR BULB IRRIG 60ML STRL (SYRINGE) ×2 IMPLANT
SYR CONTROL 10ML LL (SYRINGE) ×2 IMPLANT
TOWEL OR 17X24 6PK STRL BLUE (TOWEL DISPOSABLE) ×2 IMPLANT
TUBE CONNECTING 12X1/4 (SUCTIONS) ×2 IMPLANT
UNDERPAD 30X36 HEAVY ABSORB (UNDERPADS AND DIAPERS) ×2 IMPLANT
WATER STERILE IRR 500ML POUR (IV SOLUTION) ×2 IMPLANT
YANKAUER SUCT BULB TIP NO VENT (SUCTIONS) ×2 IMPLANT

## 2023-01-05 NOTE — Anesthesia Postprocedure Evaluation (Signed)
Anesthesia Post Note  Patient: Mary Donovan  Procedure(s) Performed: Barrie Lyme IMPLANT FIRST STAGE---Medtr[employer] INTERSTIM IMPLANT SECOND STAGE     Patient location during evaluation: Phase II Anesthesia Type: MAC Level of consciousness: awake and alert, patient cooperative and oriented Pain management: pain level controlled Vital Signs Assessment: post-procedure vital signs reviewed and stable Respiratory status: spontaneous breathing, nonlabored ventilation and respiratory function stable Cardiovascular status: stable and blood pressure returned to baseline Postop Assessment: no apparent nausea or vomiting, able to ambulate and adequate PO intake Anesthetic complications: no   No notable events documented.  Last Vitals:  Vitals:   01/05/23 1345 01/05/23 1400  BP: 125/66 129/67  Pulse: 62 60  Resp: 15 16  Temp:  36.6 C  SpO2: 96% 98%    Last Pain:  Vitals:   01/05/23 0917  TempSrc: Oral                 Jaecion Dempster,E. Suraj Ramdass

## 2023-01-05 NOTE — H&P (Signed)
[facility] Pre-Operative H&P  Subjective Chief Complaint: Mary Donovan presents for a preoperative encounter.  Her symptoms include incomplete bladder emptying, overactive bladder and fecal incontinence scheduled. She had a basic evaluation with Interstim PNE in the office and she showed > 50% improvement in her symptoms.   Past Medical History:   History of Present Illness: Mary Donovan is a 77 y.o. female who presents for preoperative visit.  She is scheduled to undergo Placement of sacral nerve stimulator lead with fluroroscopic guidance and implantable pulse generator on 01/05/23.  Her symptoms include incomplete bladder emptying, overactive bladder and fecal incontinence scheduled. She had a basic evaluation with Interstim PNE in the office and she showed > 50% improvement in her symptoms.   Past Medical History:  Diagnosis Date   Arthritis    Complication of anesthesia    COVID 11/26/2020   asymptomatic   Crohn disease ([place])    DM type 2 (diabetes mellitus, type 2) ([place])    Hypercholesterolemia    Hypertension    Pneumonia    PONV (postoperative nausea and vomiting)    slowo to wake up   Seasonal rhinitis    Skin cancer    forehead and face basal cell carconoma removed   Uterine prolapse without vaginal wall prolapse    Vaginal Pap smear, abnormal 1978   hysterectomy   Vitamin B 12 deficiency      Past Surgical History:  Procedure Laterality Date   ANTERIOR AND POSTERIOR REPAIR WITH SACROSPINOUS FIXATION N/A 12/21/2020   Procedure: SACROSPINOUS FIXATION;;  Surgeon: [name], MD;  Location: [facility];  Service: Gynecology;  Laterality: N/A;  total time of procedure 120 min; anterior repair only, no posterior   APPENDECTOMY  1999   with colon surgery   BREAST REDUCTION SURGERY  1985   CYSTOCELE REPAIR N/A 12/21/2020   Procedure: ANTERIOR REPAIR (CYSTOCELE);  Surgeon: [name], MD;  Location: [facility];  Service: Gynecology;  Laterality: N/A;   CYSTOSCOPY N/A 12/21/2020   Procedure: CYSTOSCOPY;  Surgeon: [name], MD;  Location: [facility];  Service: Gynecology;  Laterality: N/A;   CYSTOSCOPY N/A  07/27/2021   Procedure: CYSTOSCOPY;  Surgeon: [name], MD;  Location: [facility];  Service: Gynecology;  Laterality: N/A;   CYSTOSCOPY N/A 01/28/2022   Procedure: CYSTOSCOPY;  Surgeon: [name], MD;  Location: [facility];  Service: Gynecology;  Laterality: N/A;   DG ELBOW RIGHT COMPLETE ([place] HX)     Illectomy  02/1998   left foot reconstruction  07/2011 left right dec 2012   PERINEOPLASTY N/A 12/21/2020   Procedure: PERINEOPLASTY;  Surgeon: [name], MD;  Location: [facility];  Service: Gynecology;  Laterality: N/A;   POLYPECTOMY     right big toe blister  drained  2013   wound caree done   right great toe wound care  2020   ROBOTIC ASSISTED LAPAROSCOPIC LYSIS OF ADHESION  07/27/2021   Procedure: XI ROBOTIC ASSISTED LAPAROSCOPIC LYSIS OF ADHESION;  Surgeon: [name], MD;  Location: [facility];  Service: Gynecology;;   ROBOTIC ASSISTED LAPAROSCOPIC SACROCOLPOPEXY N/A 07/27/2021   Procedure: XI ROBOTIC ASSISTED LAPAROSCOPIC SACROCOLPOPEXY;  Surgeon: [name], MD;  Location: [facility];  Service: Gynecology;  Laterality: N/A;   ROBOTIC ASSISTED LAPAROSCOPIC SACROCOLPOPEXY N/A 01/28/2022   Procedure: XI ROBOTIC ASSISTED LAPAROSCOPIC SACROCOLPOPEXY ---Revision;  Surgeon: [name], MD;  Location: [facility];  Service: Gynecology;  Laterality: N/A;   SKIN CANCER EXCISION     yes x3   VAGINAL HYSTERECTOMY  1978   partial   WISDOM TOOTH EXTRACTION  is allergic to clindamycin, latex, neosporin original [bacitracin-neomycin-polymyxin], penicillin g, and tape.   Family History  Problem Relation Age of Onset   Cancer Mother    Cancer Paternal Grandmother    Cancer Daughter    Cancer Paternal Aunt    Breast cancer Paternal Aunt    Diabetes Other    Hypertension Neg Hx     Social History   Tobacco Use   Smoking status: Never   Smokeless tobacco: Never   Vaping Use   Vaping Use: Never used  Substance Use Topics   Alcohol use: Not Currently    Comment: rare   Drug use: Never     Review of Systems was negative for a full 10 system review except as noted in the History of Present Illness.   Current Facility-Administered Medications:    lactated ringers infusion, , Intravenous, Continuous, Nolon Nations, MD, Last Rate: 10 mL/hr at 01/05/23 1024, New Bag at 01/05/23 1024   povidone-iodine 10 % swab 2 Application, 2 Application, Topical, Once, [name], MD   Objective Vitals:   01/05/23 0917  BP: (!) 176/79  Pulse: 72  Resp: 16  Temp: (!) 97.5 F (36.4 C)  SpO2: 99%    Gen: NAD CV: S1 S2 RRR Lungs: Clear to auscultation bilaterally Abd: soft, nontender      Assessment/ Plan  The patient is a 77 y.o. year old with incomplete bladder emptying, overactive bladder and fecal incontinence scheduled to undergo Placement of sacral nerve stimulator lead with fluroroscopic guidance and implantable pulse generator.   [name], MD

## 2023-01-05 NOTE — Discharge Instructions (Addendum)
POST OPERATIVE SACRAL NEUROMODULATION INSTRUCTIONS  General Instructions Recovery (not bed rest) will last approximately 1 weeks Walking is encouraged, but refrain from strenuous exercise/ housework/ heavy lifting. Bathing:  Do not submerge in water (NO swimming, bath, hot tub, etc) until after your postop visit.  You may shower. You can wash with soap and water but do not scrub the incision.  No driving until you are not taking narcotic pain medicine and until your pain is well enough controlled that you can slam on the breaks or make sudden movements if needed.   Taking your medications Please take your acetaminophen and ibuprofen on a schedule for the first 48 hours. Take 600mg  ibuprofen, then take 500mg  acetaminophen 3 hours later, then continue to alternate ibuprofen and acetaminophen. That way you are taking each type of medication every 6 hours. Take the prescribed narcotic (oxycodone, tramadol, etc) as needed, with a maximum being every 4 hours.  Antibiotics will be prescribed during the test period (after stage 1).   Reasons to Call the Nurse (see last page for phone numbers) Persistent nausea/vomiting Fever (100.4 degrees or more) Incision problems (pus, blood or other fluid coming out, redness, warmth, increased pain)  Things to Expect After Surgery Mild to Moderate pain is normal during the first day or two after surgery. If prescribed, take Ibuprofen or Tylenol first and use the stronger medicine for "break-through" pain. You can overlap these medicines because they work differently.   Constipation   To Prevent Constipation:  Eat a well-balanced diet including protein, grains, fresh fruit and vegetables.  Drink plenty of fluids. Walk regularly.  Depending on specific instructions from your physician: take Miralax daily and additionally you can add a stool softener (colace/ docusate) and fiber supplement. Continue as long as you're on pain medications.   To Treat Constipation:   If you do not have a bowel movement in 2 days after surgery, you can take 2 Tbs of Milk of Magnesia 1-2 times a day until you have a bowel movement. If diarrhea occurs, decrease the amount or stop the laxative. If no results with Milk of Magnesia, you can drink a bottle of magnesium citrate which you can purchase over the counter.  Fatigue:  This is a normal response to surgery and will improve with time.  Plan frequent rest periods throughout the day.   Return to Work  As work demands and recovery times vary widely, it is hard to predict when you will want to return to work. If you have a desk job with no strenuous physical activity, and if you would like to return sooner than generally recommended, discuss this with your provider or call our office.   Post op concerns  For non-emergent issues, please call the Urogynecology Nurse. Please leave a message and someone will contact you within one business day.  You can also send a message through [facility].   AFTER HOURS (After 5:00 PM and on weekends):  For urgent matters that cannot wait until the next business day. Call our office [phone] and connect to the doctor on call.  Please reserve this for important issues.   **FOR ANY TRUE EMERGENCY ISSUES CALL 911 OR GO TO THE NEAREST EMERGENCY ROOM.** Please inform our office or the doctor on call of any emergency.     APPOINTMENTS: Call [phone]     No acetaminophen/Tylenol until after 4:26pm today if needed for pain.     Post Anesthesia Home Care Instructions  Activity: Get plenty of rest for  the remainder of the day. A responsible individual must stay with you for 24 hours following the procedure.  For the next 24 hours, DO NOT: -Drive a car -Paediatric nurse -Drink alcoholic beverages -Take any medication unless instructed by your physician -Make any legal decisions or sign important papers.  Meals: Start with liquid foods such as gelatin or soup. Progress to regular  foods as tolerated. Avoid greasy, spicy, heavy foods. If nausea and/or vomiting occur, drink only clear liquids until the nausea and/or vomiting subsides. Call your physician if vomiting continues.  Special Instructions/Symptoms: Your throat may feel dry or sore from the anesthesia or the breathing tube placed in your throat during surgery. If this causes discomfort, gargle with warm salt water. The discomfort should disappear within 24 hours.

## 2023-01-05 NOTE — Anesthesia Preprocedure Evaluation (Addendum)
Anesthesia Evaluation  Patient identified by MRN, date of birth, ID band Patient awake    Reviewed: Allergy & Precautions, NPO status , Patient's Chart, lab work & pertinent test results  History of Anesthesia Complications (+) PONV, DIFFICULT AIRWAY and history of anesthetic complications (easy Video Glide intubation x2)  Airway Mallampati: IV  TM Distance: <3 FB Neck ROM: Limited    Dental  (+) Dental Advisory Given, Chipped   Pulmonary COPD   breath sounds clear to auscultation       Cardiovascular hypertension, Pt. on medications (-) angina  Rhythm:Regular Rate:Normal     Neuro/Psych Peripheral neuropathy    GI/Hepatic Neg liver ROS,,,Crohn's   Endo/Other  diabetes (glu 125), Oral Hypoglycemic Agents    Renal/GU negative Renal ROS     Musculoskeletal  (+) Arthritis ,    Abdominal   Peds  Hematology negative hematology ROS (+)   Anesthesia Other Findings   Reproductive/Obstetrics                             Anesthesia Physical Anesthesia Plan  ASA: 2  Anesthesia Plan: MAC   Post-op Pain Management: Tylenol PO (pre-op)*   Induction:   PONV Risk Score and Plan: 3 and Ondansetron and Treatment may vary due to age or medical condition  Airway Management Planned: Natural Airway and Simple Face Mask  Additional Equipment: None  Intra-op Plan:   Post-operative Plan:   Informed Consent: I have reviewed the patients History and Physical, chart, labs and discussed the procedure including the risks, benefits and alternatives for the proposed anesthesia with the patient or authorized representative who has indicated his/her understanding and acceptance.     Dental advisory given  Plan Discussed with: CRNA and Surgeon  Anesthesia Plan Comments:         Anesthesia Quick Evaluation

## 2023-01-05 NOTE — Transfer of Care (Signed)
Immediate Anesthesia Transfer of Care Note  Patient: Mary Donovan  Procedure(s) Performed: Barrie Lyme IMPLANT FIRST STAGE---[employer] INTERSTIM IMPLANT SECOND STAGE  Patient Location: PACU  Anesthesia Type:MAC  Level of Consciousness: awake, alert , and oriented  Airway & Oxygen Therapy: Patient Spontanous Breathing and Patient connected to face mask oxygen  Post-op Assessment: Report given to RN and Post -op Vital signs reviewed and stable  Post vital signs: Reviewed and stable  Last Vitals:  Vitals Value Taken Time  BP 121/68 01/05/23 1324  Temp    Pulse 70 01/05/23 1326  Resp 15 01/05/23 1326  SpO2 98 % 01/05/23 1326  Vitals shown include unvalidated device data.  Last Pain:  Vitals:   01/05/23 0917  TempSrc: Oral         Complications: No notable events documented.

## 2023-01-05 NOTE — Op Note (Signed)
Operative Note  Preoperative Diagnosis: overactive bladder, incomplete bladder emptying, and fecal incontinence  Postoperative Diagnosis: same  Procedures performed:  Placement of Interstim sacral neuromodulation lead (left) with fluoroscopic guidance, and placement of implantable pulse generator (right)  Implants:  Implant Name Type Inv. Item Serial No. Manufacturer Lot No. LRB No. Used Action  LEAD INTERSTIM 4.32 28 L - [id number] Lead LEAD INTERSTIM 4.32 28 L  MEDTRONIC NEUROMOD PAIN MGMT [id number] N/A 1 Implanted  STIMULATOR INTERSTIM 2X1.7X.3 - [id number] H Miscellaneous STIMULATOR INTERSTIM 2X1.7X.3 [id number] H MEDTRONIC NEUROMOD PAIN MGMT  N/A 1 Implanted  [id number] N/A 1 Implanted    Attending Surgeon: Sherlene Shams, MD  Anesthesia: MAC  Findings: Optimal response with lead placement in S3 on the left.     Specimens: * No specimens in log *  Estimated blood loss: 15 mL  IV fluids: 300 mL  Urine output: not recorded   Complications: none  Procedure in Detail:  After informed consent was obtained, the patient was taken to the operating room where anesthesia was induced and found to be adequate. Patient was placed in the prone position with adequate padding to flatten the sacrum and under the shins to allow the toes to dangle freely. She was then prepped and draped in the usual sterile fashion. The C-arm was positioned in the AP and lateral positions to provide fluoroscopic mapping of the sacral region. Spinal needles in laid in parallel were used to identify the foramen landmarks.The S3 foramen was identified. Local injection of 0.25% bupivacaine was injected down each side to the level of the sacrum. The foramen needle was placed at a 60 degree angle into the S3 foramen on the left. Proper needle depth was confirmed with fluoroscopy.   Position was confirmed by direct observation of bellowing and plantar flexion of the toe utilizing the external test  stimulator. Optimal response was noted so no testing was performed on the right.   The foramen needle stylet was removed and guidewire was placed. An incision was made with an 11-blade scalpel and the introducer was placed over the guidewire. Proper depth was confirmed with fluoroscopy until the opaque mark of the dilator was seen on the anterior rim of the sacrum with fluoroscopy.  The obturator was unlocked and removed and the lead was placed through the introducer sheath to the first white line.  Position was checked fluoroscopically.  The lead was then introduced until three electrodes were visible below the sacrum.  Each electrode was tested for visualization of the bellows and plantar flexion of the great toe.  After satisfactory positioning was confirmed under fluoroscopy, the introducer sheath was retracted deploying the lead ties into the perisacral tissues under live fluoroscopy.    Attention was turned to the patient's right  where anesthetic was injected at a point previously marked on the buttocks, below the iliac crest and below the belt line. The skin was injected with 0.25% bupivacaine and an incision was made with a 15 blade scalpel. Further dissection was made into the subcutaneous tissue, allowing for a sufficient pocket for the generator.  A tunneling tool and tube were placed from the sacral lead subcutaneously to the incised pocket site.  The tunneling tool was removed and the lead was sent through the tube and pulled out at the pocket site. The pocket was noted to be hemostatic. The generator was connected and placed in the pocket in a tyrx pouch. It was noted to have adequate room but also stayed  in place. The incision was inspected and noted to be hemostatic.  The subcutaneous layer was closed with running 2-0 Vicryl. The skin was closed with a subcuticular stitch of 4-0 monocryl. The incision from the neuroelectrode was closed with interrupted  4-0 monocryl.  The incisions were covered  with skin glue. The patient tolerated the procedure well and was taken to the recovery room in stable condition. Needle and sponge count was correct x2.    [name], MD

## 2023-01-06 ENCOUNTER — Encounter (HOSPITAL_BASED_OUTPATIENT_CLINIC_OR_DEPARTMENT_OTHER): Payer: Self-pay | Admitting: Obstetrics and Gynecology

## 2023-02-03 ENCOUNTER — Ambulatory Visit (INDEPENDENT_AMBULATORY_CARE_PROVIDER_SITE_OTHER): Payer: Medicare Other | Admitting: Obstetrics and Gynecology

## 2023-02-03 ENCOUNTER — Encounter: Payer: Self-pay | Admitting: Obstetrics and Gynecology

## 2023-02-03 VITALS — BP 162/81 | HR 73

## 2023-02-03 DIAGNOSIS — N3281 Overactive bladder: Secondary | ICD-10-CM

## 2023-02-03 NOTE — Progress Notes (Signed)
[facility]  Date of Visit: 02/03/2023  History of Present Illness: Ms. Orengo is a 77 y.o. female scheduled today for a post-operative visit.   Surgery: s/p Interstim implant on 01/05/23   On Program 3 at 1.0 mA Feels that symptoms are greatly improved. Mostly feels she is emptying her bladder. Has a lot less urgency and is able to make it to the bathroom without leakage most times (for both bladder and bowel). She is very happy with her results. Having a little itching around her incisions. Denies drainage or pain.   Pt voided and PVR today from bladder scan is .   s/p Robotic assisted sacrocolpopexy (revision), cystoscopy on 01/28/22   Medications: She has a current medication list which includes the following prescription(s): acetaminophen, acetaminophen, calcium-magnesium-vitamin d, celecoxib, vitamin d3, contour next test, cyanocobalamin, furosemide, gabapentin, glimepiride, ibuprofen, loperamide, magnesium, metformin, montelukast, oxycodone, oxymetazoline, polyethylene glycol, simvastatin, and ferrous sulfate.   Allergies: Patient is allergic to clindamycin, latex, neosporin original [bacitracin-neomycin-polymyxin], penicillin g, and tape.   Physical Exam: BP (!) 162/81   Pulse 73    Sacral and buttock Incisions: healing well. Some skin glue present. Some spotty erythema around incisions but no fluctuance or drainage.    ---------------------------------------------------------  Assessment and Plan:  1. Overactive bladder     - Good results, incomplete bladder emptying has greatly improved.  - Can use hydrocortisone cream for itching as needed.  - Will keep Interstim on current program- 3 at 1.0 - Can resume regular activity.   Follow up 6 months or sooner if needed  Marguerita Beards, MD

## 2023-02-08 DIAGNOSIS — Z1211 Encounter for screening for malignant neoplasm of colon: Secondary | ICD-10-CM | POA: Diagnosis not present

## 2023-02-08 DIAGNOSIS — E1169 Type 2 diabetes mellitus with other specified complication: Secondary | ICD-10-CM | POA: Diagnosis not present

## 2023-02-08 DIAGNOSIS — E7800 Pure hypercholesterolemia, unspecified: Secondary | ICD-10-CM | POA: Diagnosis not present

## 2023-02-08 DIAGNOSIS — I10 Essential (primary) hypertension: Secondary | ICD-10-CM | POA: Diagnosis not present

## 2023-02-08 DIAGNOSIS — Z0001 Encounter for general adult medical examination with abnormal findings: Secondary | ICD-10-CM | POA: Diagnosis not present

## 2023-02-08 DIAGNOSIS — Z79899 Other long term (current) drug therapy: Secondary | ICD-10-CM | POA: Diagnosis not present

## 2023-02-08 DIAGNOSIS — E538 Deficiency of other specified B group vitamins: Secondary | ICD-10-CM | POA: Diagnosis not present

## 2023-02-13 DIAGNOSIS — Z1211 Encounter for screening for malignant neoplasm of colon: Secondary | ICD-10-CM | POA: Diagnosis not present

## 2023-03-09 DIAGNOSIS — E538 Deficiency of other specified B group vitamins: Secondary | ICD-10-CM | POA: Diagnosis not present

## 2023-04-07 DIAGNOSIS — E538 Deficiency of other specified B group vitamins: Secondary | ICD-10-CM | POA: Diagnosis not present

## 2023-05-03 DIAGNOSIS — E119 Type 2 diabetes mellitus without complications: Secondary | ICD-10-CM | POA: Diagnosis not present

## 2023-05-03 DIAGNOSIS — Z8631 Personal history of diabetic foot ulcer: Secondary | ICD-10-CM | POA: Diagnosis not present

## 2023-05-03 DIAGNOSIS — E1142 Type 2 diabetes mellitus with diabetic polyneuropathy: Secondary | ICD-10-CM | POA: Diagnosis not present

## 2023-05-05 DIAGNOSIS — E538 Deficiency of other specified B group vitamins: Secondary | ICD-10-CM | POA: Diagnosis not present

## 2023-06-02 DIAGNOSIS — E538 Deficiency of other specified B group vitamins: Secondary | ICD-10-CM | POA: Diagnosis not present

## 2023-07-03 DIAGNOSIS — E538 Deficiency of other specified B group vitamins: Secondary | ICD-10-CM | POA: Diagnosis not present

## 2023-08-04 ENCOUNTER — Ambulatory Visit (INDEPENDENT_AMBULATORY_CARE_PROVIDER_SITE_OTHER): Payer: Medicare Other | Admitting: Obstetrics and Gynecology

## 2023-08-04 ENCOUNTER — Encounter: Payer: Self-pay | Admitting: Obstetrics and Gynecology

## 2023-08-04 VITALS — BP 128/66 | HR 62

## 2023-08-04 DIAGNOSIS — R159 Full incontinence of feces: Secondary | ICD-10-CM

## 2023-08-04 DIAGNOSIS — N3281 Overactive bladder: Secondary | ICD-10-CM

## 2023-08-04 DIAGNOSIS — N393 Stress incontinence (female) (male): Secondary | ICD-10-CM

## 2023-08-04 DIAGNOSIS — N8110 Cystocele, unspecified: Secondary | ICD-10-CM

## 2023-08-04 NOTE — Progress Notes (Signed)
[facility]  Date of Visit: 08/04/2023  History of Present Illness: [name] is a 77 y.o. female scheduled today for follow up from incomplete bladder emptying with urge incontinence, bowel leakage.  Surgery: s/p Interstim implant on 01/05/23 and s/p Robotic assisted sacrocolpopexy (revision), cystoscopy on 01/28/22   On Program 3 at 1.0 mA  Feels she is emptying her bladder better. She also continues to have less urgency. She still has leakage with bending mostly. Bowel leakage has been mostly better, still needs to take imodium- she usually can make it to the bathroom without leakage.   Prolapse bothers her sometimes, but not nearly as much as it was.   Medications: She has a current medication list which includes the following prescription(s): calcium-magnesium-vitamin d, celecoxib, vitamin d3, contour next test, cyanocobalamin, furosemide, gabapentin, glimepiride, loperamide, magnesium, metformin, montelukast, oxymetazoline, simvastatin, acetaminophen, acetaminophen, ferrous sulfate, ibuprofen, oxycodone, and polyethylene glycol.   Allergies: Patient is allergic to clindamycin, latex, neosporin original [bacitracin-neomycin-polymyxin], penicillin g, and tape.   Physical Exam: BP 128/66   Pulse 62    AAO x3   ---------------------------------------------------------  Assessment and Plan:  No diagnosis found.  - Urinary urgency and bowel leakage have improved with interstim device. Will keep Interstim on current program- 3 at 1.0 as she had had good success.  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 She is potentially interested in urethral bulking and handout provided to her to review.  We discussed success rate of approximately 70-80% and possible need for second injection. We reviewed that this is not a permanent procedure and the Bulkamid does dissolve over time. Risks reviewed including injury to bladder  or urethra, UTI, urinary retention and hematuria.  - Will continue to monitor POP  Follow up 6 months or sooner if needed  Marguerita Beards, MD  Time spent: I spent 20 minutes dedicated to the care of this patient on the date of this encounter to include pre-visit review of records, face-to-face time with the patient and post visit documentation.

## 2023-08-09 ENCOUNTER — Ambulatory Visit: Payer: Medicare Other | Admitting: Obstetrics and Gynecology

## 2023-08-15 DIAGNOSIS — E1169 Type 2 diabetes mellitus with other specified complication: Secondary | ICD-10-CM | POA: Diagnosis not present

## 2023-08-15 DIAGNOSIS — E538 Deficiency of other specified B group vitamins: Secondary | ICD-10-CM | POA: Diagnosis not present

## 2023-08-15 DIAGNOSIS — Z79899 Other long term (current) drug therapy: Secondary | ICD-10-CM | POA: Diagnosis not present

## 2023-08-15 DIAGNOSIS — I10 Essential (primary) hypertension: Secondary | ICD-10-CM | POA: Diagnosis not present

## 2023-09-08 DIAGNOSIS — E538 Deficiency of other specified B group vitamins: Secondary | ICD-10-CM | POA: Diagnosis not present

## 2023-10-06 DIAGNOSIS — E538 Deficiency of other specified B group vitamins: Secondary | ICD-10-CM | POA: Diagnosis not present

## 2023-11-07 DIAGNOSIS — E538 Deficiency of other specified B group vitamins: Secondary | ICD-10-CM | POA: Diagnosis not present

## 2023-11-10 DIAGNOSIS — L821 Other seborrheic keratosis: Secondary | ICD-10-CM | POA: Diagnosis not present

## 2023-11-10 DIAGNOSIS — D485 Neoplasm of uncertain behavior of skin: Secondary | ICD-10-CM | POA: Diagnosis not present

## 2023-11-10 DIAGNOSIS — Z08 Encounter for follow-up examination after completed treatment for malignant neoplasm: Secondary | ICD-10-CM | POA: Diagnosis not present

## 2023-11-10 DIAGNOSIS — Z85828 Personal history of other malignant neoplasm of skin: Secondary | ICD-10-CM | POA: Diagnosis not present

## 2023-11-10 DIAGNOSIS — C44319 Basal cell carcinoma of skin of other parts of face: Secondary | ICD-10-CM | POA: Diagnosis not present

## 2023-11-27 DIAGNOSIS — C44319 Basal cell carcinoma of skin of other parts of face: Secondary | ICD-10-CM | POA: Diagnosis not present

## 2023-12-08 DIAGNOSIS — E538 Deficiency of other specified B group vitamins: Secondary | ICD-10-CM | POA: Diagnosis not present

## 2023-12-13 DIAGNOSIS — C44319 Basal cell carcinoma of skin of other parts of face: Secondary | ICD-10-CM | POA: Diagnosis not present

## 2024-01-05 DIAGNOSIS — E538 Deficiency of other specified B group vitamins: Secondary | ICD-10-CM | POA: Diagnosis not present

## 2024-01-31 ENCOUNTER — Encounter: Payer: Self-pay | Admitting: Obstetrics and Gynecology

## 2024-01-31 ENCOUNTER — Ambulatory Visit (INDEPENDENT_AMBULATORY_CARE_PROVIDER_SITE_OTHER): Admitting: Obstetrics and Gynecology

## 2024-01-31 VITALS — BP 146/74 | HR 77

## 2024-01-31 DIAGNOSIS — N3281 Overactive bladder: Secondary | ICD-10-CM

## 2024-01-31 DIAGNOSIS — R159 Full incontinence of feces: Secondary | ICD-10-CM | POA: Diagnosis not present

## 2024-01-31 NOTE — Progress Notes (Signed)
 [facility]  Date of Visit: 01/31/2024  History of Present Illness: Ms. Hise is a 78 y.o. female scheduled today for follow up from incomplete bladder emptying with urge incontinence, bowel leakage.  Surgery: s/p Interstim implant on 01/05/23 and s/p Robotic assisted sacrocolpopexy (revision), cystoscopy on 01/28/22   On Program 3 at 1.0 mA  Urinary urgency has been stable on current settings. Feels like it is working well. Chrons symptoms have been stable overall. Still taking imodium as needed. No complaints.   Prolapse bothers her sometimes, but not nearly as much as it was.    Medications: She has a current medication list which includes the following prescription(s): acetaminophen, acetaminophen, calcium-magnesium-vitamin d, celecoxib, vitamin d3, contour next test, cyanocobalamin, ferrous sulfate, furosemide, gabapentin, glimepiride, ibuprofen, loperamide, magnesium, metformin, montelukast, oxycodone, oxymetazoline, polyethylene glycol, and simvastatin.   Allergies: Patient is allergic to clindamycin, latex, neosporin original [bacitracin-neomycin-polymyxin], penicillin g, and tape.   Physical Exam: BP (!) 146/74   Pulse 77    AAO x3   ---------------------------------------------------------  Assessment and Plan:  1. Overactive bladder   2. Incontinence of feces, unspecified fecal incontinence type     - Will keep Interstim on current program- 3 at 1.0 as she had had good success.  - Will continue to monitor POP  Follow up 1 year or sooner if needed  Arma Lamp, MD  Time spent: I spent 15 minutes dedicated to the care of this patient on the date of this encounter to include pre-visit review of records, face-to-face time with the patient and post visit documentation.

## 2024-02-05 DIAGNOSIS — E538 Deficiency of other specified B group vitamins: Secondary | ICD-10-CM | POA: Diagnosis not present

## 2024-03-06 DIAGNOSIS — I10 Essential (primary) hypertension: Secondary | ICD-10-CM | POA: Diagnosis not present

## 2024-03-06 DIAGNOSIS — Z1211 Encounter for screening for malignant neoplasm of colon: Secondary | ICD-10-CM | POA: Diagnosis not present

## 2024-03-06 DIAGNOSIS — E538 Deficiency of other specified B group vitamins: Secondary | ICD-10-CM | POA: Diagnosis not present

## 2024-03-06 DIAGNOSIS — Z0001 Encounter for general adult medical examination with abnormal findings: Secondary | ICD-10-CM | POA: Diagnosis not present

## 2024-03-06 DIAGNOSIS — Z79899 Other long term (current) drug therapy: Secondary | ICD-10-CM | POA: Diagnosis not present

## 2024-03-06 DIAGNOSIS — E7800 Pure hypercholesterolemia, unspecified: Secondary | ICD-10-CM | POA: Diagnosis not present

## 2024-03-06 DIAGNOSIS — E2839 Other primary ovarian failure: Secondary | ICD-10-CM | POA: Diagnosis not present

## 2024-03-06 DIAGNOSIS — E119 Type 2 diabetes mellitus without complications: Secondary | ICD-10-CM | POA: Diagnosis not present

## 2024-03-16 DIAGNOSIS — I10 Essential (primary) hypertension: Secondary | ICD-10-CM | POA: Diagnosis not present

## 2024-03-16 DIAGNOSIS — E1169 Type 2 diabetes mellitus with other specified complication: Secondary | ICD-10-CM | POA: Diagnosis not present

## 2024-03-16 DIAGNOSIS — E7800 Pure hypercholesterolemia, unspecified: Secondary | ICD-10-CM | POA: Diagnosis not present

## 2024-03-20 DIAGNOSIS — Z79899 Other long term (current) drug therapy: Secondary | ICD-10-CM | POA: Diagnosis not present

## 2024-03-21 DIAGNOSIS — Z1211 Encounter for screening for malignant neoplasm of colon: Secondary | ICD-10-CM | POA: Diagnosis not present

## 2024-04-05 DIAGNOSIS — E538 Deficiency of other specified B group vitamins: Secondary | ICD-10-CM | POA: Diagnosis not present

## 2024-04-11 DIAGNOSIS — E2839 Other primary ovarian failure: Secondary | ICD-10-CM | POA: Diagnosis not present

## 2024-04-11 DIAGNOSIS — Z780 Asymptomatic menopausal state: Secondary | ICD-10-CM | POA: Diagnosis not present

## 2024-04-15 DIAGNOSIS — E7800 Pure hypercholesterolemia, unspecified: Secondary | ICD-10-CM | POA: Diagnosis not present

## 2024-04-15 DIAGNOSIS — E1169 Type 2 diabetes mellitus with other specified complication: Secondary | ICD-10-CM | POA: Diagnosis not present

## 2024-04-15 DIAGNOSIS — I10 Essential (primary) hypertension: Secondary | ICD-10-CM | POA: Diagnosis not present

## 2024-05-03 DIAGNOSIS — L84 Corns and callosities: Secondary | ICD-10-CM | POA: Diagnosis not present

## 2024-05-03 DIAGNOSIS — Z8631 Personal history of diabetic foot ulcer: Secondary | ICD-10-CM | POA: Diagnosis not present

## 2024-05-03 DIAGNOSIS — E119 Type 2 diabetes mellitus without complications: Secondary | ICD-10-CM | POA: Diagnosis not present

## 2024-05-06 DIAGNOSIS — E538 Deficiency of other specified B group vitamins: Secondary | ICD-10-CM | POA: Diagnosis not present

## 2024-05-16 DIAGNOSIS — E1169 Type 2 diabetes mellitus with other specified complication: Secondary | ICD-10-CM | POA: Diagnosis not present

## 2024-05-16 DIAGNOSIS — I10 Essential (primary) hypertension: Secondary | ICD-10-CM | POA: Diagnosis not present

## 2024-05-16 DIAGNOSIS — E7800 Pure hypercholesterolemia, unspecified: Secondary | ICD-10-CM | POA: Diagnosis not present

## 2024-06-06 DIAGNOSIS — E538 Deficiency of other specified B group vitamins: Secondary | ICD-10-CM | POA: Diagnosis not present

## 2024-06-16 DIAGNOSIS — I10 Essential (primary) hypertension: Secondary | ICD-10-CM | POA: Diagnosis not present

## 2024-06-16 DIAGNOSIS — E1169 Type 2 diabetes mellitus with other specified complication: Secondary | ICD-10-CM | POA: Diagnosis not present

## 2024-06-16 DIAGNOSIS — E7800 Pure hypercholesterolemia, unspecified: Secondary | ICD-10-CM | POA: Diagnosis not present

## 2024-07-04 DIAGNOSIS — E538 Deficiency of other specified B group vitamins: Secondary | ICD-10-CM | POA: Diagnosis not present

## 2024-07-16 DIAGNOSIS — E1169 Type 2 diabetes mellitus with other specified complication: Secondary | ICD-10-CM | POA: Diagnosis not present

## 2024-07-16 DIAGNOSIS — E7800 Pure hypercholesterolemia, unspecified: Secondary | ICD-10-CM | POA: Diagnosis not present

## 2024-07-16 DIAGNOSIS — I10 Essential (primary) hypertension: Secondary | ICD-10-CM | POA: Diagnosis not present

## 2024-08-06 DIAGNOSIS — E538 Deficiency of other specified B group vitamins: Secondary | ICD-10-CM | POA: Diagnosis not present

## 2024-08-16 DIAGNOSIS — E1169 Type 2 diabetes mellitus with other specified complication: Secondary | ICD-10-CM | POA: Diagnosis not present

## 2024-08-16 DIAGNOSIS — E7800 Pure hypercholesterolemia, unspecified: Secondary | ICD-10-CM | POA: Diagnosis not present

## 2024-08-16 DIAGNOSIS — I10 Essential (primary) hypertension: Secondary | ICD-10-CM | POA: Diagnosis not present

## 2024-09-06 DIAGNOSIS — I10 Essential (primary) hypertension: Secondary | ICD-10-CM | POA: Diagnosis not present

## 2024-09-06 DIAGNOSIS — E119 Type 2 diabetes mellitus without complications: Secondary | ICD-10-CM | POA: Diagnosis not present

## 2024-09-06 DIAGNOSIS — Z79899 Other long term (current) drug therapy: Secondary | ICD-10-CM | POA: Diagnosis not present

## 2024-09-06 DIAGNOSIS — E538 Deficiency of other specified B group vitamins: Secondary | ICD-10-CM | POA: Diagnosis not present

## 2024-09-06 DIAGNOSIS — E7800 Pure hypercholesterolemia, unspecified: Secondary | ICD-10-CM | POA: Diagnosis not present

## 2024-09-15 DIAGNOSIS — E7800 Pure hypercholesterolemia, unspecified: Secondary | ICD-10-CM | POA: Diagnosis not present

## 2024-09-15 DIAGNOSIS — I10 Essential (primary) hypertension: Secondary | ICD-10-CM | POA: Diagnosis not present

## 2024-09-15 DIAGNOSIS — E1169 Type 2 diabetes mellitus with other specified complication: Secondary | ICD-10-CM | POA: Diagnosis not present
